# Patient Record
Sex: Female | Born: 1950 | Race: White | Hispanic: No | Marital: Married | Smoking: Former smoker
Health system: Southern US, Community
[De-identification: ages and names within clinical notes are randomized; demographics above are authoritative.]

## PROBLEM LIST (undated history)

## (undated) DIAGNOSIS — E669 Obesity, unspecified: Secondary | ICD-10-CM

## (undated) DIAGNOSIS — D649 Anemia, unspecified: Secondary | ICD-10-CM

## (undated) DIAGNOSIS — E7800 Pure hypercholesterolemia, unspecified: Secondary | ICD-10-CM

## (undated) DIAGNOSIS — Z87891 Personal history of nicotine dependence: Secondary | ICD-10-CM

## (undated) DIAGNOSIS — N1330 Unspecified hydronephrosis: Secondary | ICD-10-CM

## (undated) DIAGNOSIS — A419 Sepsis, unspecified organism: Secondary | ICD-10-CM

## (undated) DIAGNOSIS — K635 Polyp of colon: Secondary | ICD-10-CM

## (undated) DIAGNOSIS — I10 Essential (primary) hypertension: Secondary | ICD-10-CM

## (undated) DIAGNOSIS — N390 Urinary tract infection, site not specified: Secondary | ICD-10-CM

## (undated) DIAGNOSIS — N261 Atrophy of kidney (terminal): Secondary | ICD-10-CM

## (undated) DIAGNOSIS — N135 Crossing vessel and stricture of ureter without hydronephrosis: Secondary | ICD-10-CM

## (undated) HISTORY — DX: Polyp of colon: K63.5

---

## 2004-04-28 HISTORY — PX: CHOLECYSTECTOMY: SHX55

## 2009-04-28 HISTORY — PX: EXCISION / BIOPSY BREAST / NIPPLE / DUCT: SUR469

## 2009-10-25 HISTORY — PX: BREAST SURGERY: SHX581

## 2014-04-11 ENCOUNTER — Encounter: Payer: Self-pay | Admitting: General Surgery

## 2014-04-18 ENCOUNTER — Ambulatory Visit: Payer: Self-pay | Admitting: Family Medicine

## 2014-04-25 ENCOUNTER — Encounter: Payer: Self-pay | Admitting: *Deleted

## 2014-04-25 ENCOUNTER — Ambulatory Visit: Payer: Self-pay | Admitting: General Surgery

## 2014-04-26 ENCOUNTER — Ambulatory Visit: Payer: Self-pay | Admitting: Family Medicine

## 2014-04-26 HISTORY — PX: BREAST BIOPSY: SHX20

## 2014-04-28 HISTORY — PX: COLONOSCOPY: SHX174

## 2014-06-01 ENCOUNTER — Ambulatory Visit: Payer: Self-pay | Admitting: Unknown Physician Specialty

## 2014-06-06 ENCOUNTER — Ambulatory Visit (INDEPENDENT_AMBULATORY_CARE_PROVIDER_SITE_OTHER): Payer: Managed Care, Other (non HMO) | Admitting: General Surgery

## 2014-06-06 ENCOUNTER — Encounter: Payer: Self-pay | Admitting: General Surgery

## 2014-06-06 VITALS — BP 164/88 | HR 84 | Resp 16 | Ht 64.0 in | Wt 235.0 lb

## 2014-06-06 DIAGNOSIS — N62 Hypertrophy of breast: Secondary | ICD-10-CM

## 2014-06-06 DIAGNOSIS — N6091 Unspecified benign mammary dysplasia of right breast: Secondary | ICD-10-CM

## 2014-06-06 DIAGNOSIS — D242 Benign neoplasm of left breast: Secondary | ICD-10-CM

## 2014-06-06 NOTE — Patient Instructions (Addendum)
The patient has been asked to return to the office in 6 months with an office ultrasound. Continue self breast exams. Call office for any new breast issues or concerns. Records from [place] right breast results

## 2014-06-06 NOTE — Progress Notes (Signed)
Patient ID: Victoria Clarke, female   DOB: [birth year], 64 y.o.   MRN: [id number]  Chief Complaint  Patient presents with  . Breast Problem    nipple discharge    HPI Victoria Clarke is a 64 y.o. female here today for evaluation of left nipple discharge . She states that around June she noticed that the left breast had a clear discharge. She states that it has happened a couple of times since that time all while washing dishes.  She moved here from [place] and became established with her physicians and she had her mammogram. They did complete a left breast biopsy done on 04/26/14. Since the biopsy she had not noticed any further discharge. The patient reported significant hematoma and discoloration after the procedure which is now resolving. She states that when the right breast had issues in 2011 there was a milky bloody discharge. No further issues since that surgery.  She does have regular mammograms and do regular breast exams.   HPI  Past Medical History  Diagnosis Date  . Colon polyp     Past Surgical History  Procedure Laterality Date  . Cholecystectomy  2006  . Cesarean section  1981  . Colonoscopy  2016    Dr Vira Agar  . Breast surgery Right 10/25/2009    milk duct excision in [place], atypical duct epithelial proliferation l  . Breast biopsy Left 04-26-14    ductal hyperplasia, 1 mm papilloma.    Family History  Problem Relation Age of Onset  . Cancer Sister 110    breast  . Cancer Maternal Aunt     breast/great Aunt    Social History History  Substance Use Topics  . Smoking status: Former Smoker -- 1.00 packs/day for 30 years    Types: Cigarettes    Quit date: 04/29/1999  . Smokeless tobacco: Never Used  . Alcohol Use: No    No Known Allergies  Current Outpatient Prescriptions  Medication Sig Dispense Refill  . Omega-3 Fatty Acids (FISH OIL PO) Take by mouth daily.    . Vitamin D, Ergocalciferol, (DRISDOL) 50000 UNITS CAPS capsule      No current  facility-administered medications for this visit.    Review of Systems Review of Systems  Constitutional: Negative.   Respiratory: Negative.   Cardiovascular: Negative.     Blood pressure 164/88, pulse 84, resp. rate 16, height 5\' 4"  (1.626 m), weight 235 lb (106.595 kg).  Physical Exam Physical Exam  Constitutional: She is oriented to person, place, and time. She appears well-developed and well-nourished.  Neck: Neck supple.  Cardiovascular: Normal rate, regular rhythm and normal heart sounds.   Pulmonary/Chest: Effort normal and breath sounds normal. Right breast exhibits no inverted nipple, no mass, no nipple discharge, no skin change and no tenderness. Left breast exhibits skin change. Left breast exhibits no inverted nipple, no mass, no nipple discharge and no tenderness.    Left breast > right breast. Well healed circular incision around areolar 9-3 o'clock position right breast. Residual discoloration at 3 o'clock position left breast.  Lymphadenopathy:    She has no cervical adenopathy.    She has no axillary adenopathy.  Neurological: She is alert and oriented to person, place, and time.  Skin: Skin is warm and dry.    Data: Assessment   Mammograms dated 04/18/2014 showed a 10 mm density in the upper-outer quadrant of the left breast. Subsequent ultrasound of the same date showed a simple cyst in this area. Incidentally noted was an 8  x 8 x 11 mm cystic and solid mass in the left retroareolar location at 6:00. No adenopathy. BI-RADS-4.  The patient subsequently underwent ultrasound-guided biopsy on 04/26/2014. A 12-gauge vacuum-assisted device was utilized. Choroidal marker deployed and post procedure mammogram completed.   Subsequent pathology showed a cluster of apical and microcysts and a 1 mm intraductal papilloma without atypia. No malignancy.  Impression  Ultrasound abnormality secondary to apical and cyst with incidental finding of a 1 mm papilloma. Questionable  right breast pathology with GYN notation raising a question of DCIS. Records requested.    Plan    I would not recommend excision for a 1 mm papilloma without atypia. This can be completed if the patient is anxious. I recommendation would be to reassess the area by ultrasound in 6 months. Considering that a vacuum assisted biopsy was completed  I have little suspicion of any residual papilloma.    The patient has been asked to return to the office in 6 months with an office ultrasound. Continue self breast exams. Call office for any new breast issues or concerns.  PCP:  Dionisio David M Ref: Fredrich Romans NP  Robert Bellow 06/07/2014, 5:19 PM

## 2014-06-07 ENCOUNTER — Encounter: Payer: Self-pay | Admitting: General Surgery

## 2014-06-07 DIAGNOSIS — N6091 Unspecified benign mammary dysplasia of right breast: Secondary | ICD-10-CM | POA: Insufficient documentation

## 2014-06-07 DIAGNOSIS — D249 Benign neoplasm of unspecified breast: Secondary | ICD-10-CM | POA: Insufficient documentation

## 2014-08-21 LAB — SURGICAL PATHOLOGY

## 2014-08-23 ENCOUNTER — Encounter: Payer: Self-pay | Admitting: General Surgery

## 2014-08-23 NOTE — Progress Notes (Signed)
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 This was confirmed on second opinion from [facility].  Office notes dated 10/23/2009 reported an MRI completed prior to excision had been negative.  EOMI oral risk calculation based on her family history with a sister with cancer, menarche age 65, first birth at 11 and a report of atypia on the 2011 biopsy shows a five-year risk for breast cancer at 7.9%, lifetime risk 28.1.  We will review with the patient at follow-up pros and cons of chemoprevention, and confirm the age of onset of her sisters malignancy.

## 2014-10-04 ENCOUNTER — Ambulatory Visit: Payer: Self-pay | Admitting: General Surgery

## 2014-11-06 ENCOUNTER — Ambulatory Visit: Payer: Managed Care, Other (non HMO)

## 2014-11-06 ENCOUNTER — Ambulatory Visit (INDEPENDENT_AMBULATORY_CARE_PROVIDER_SITE_OTHER): Payer: Medicare Other | Admitting: General Surgery

## 2014-11-06 ENCOUNTER — Encounter: Payer: Self-pay | Admitting: General Surgery

## 2014-11-06 VITALS — BP 134/78 | HR 76 | Resp 14 | Ht 64.0 in | Wt 232.0 lb

## 2014-11-06 DIAGNOSIS — D242 Benign neoplasm of left breast: Secondary | ICD-10-CM | POA: Diagnosis not present

## 2014-11-06 DIAGNOSIS — N6091 Unspecified benign mammary dysplasia of right breast: Secondary | ICD-10-CM

## 2014-11-06 DIAGNOSIS — N62 Hypertrophy of breast: Secondary | ICD-10-CM | POA: Diagnosis not present

## 2014-11-06 MED ORDER — TAMOXIFEN CITRATE 20 MG PO TABS: 20.0000 mg | ORAL_TABLET | Freq: Every day | ORAL | Status: DC

## 2014-11-06 NOTE — Patient Instructions (Addendum)
Patient to take aspirin 81 daily and call us in one month to let the office no how you are doing with tamoxifen.. Patient will be asked to return to the office in six months with a bilateral diagnotic mammogram. Continue self breast exams. Call office for any new breast issues or concerns.

## 2014-11-06 NOTE — Progress Notes (Signed)
Patient ID: Victoria Clarke, female   DOB: 02/27/1951, 64 y.o.   MRN: [id number]  Chief Complaint  Patient presents with  . Other    left breast ultrasound    HPI Victoria Clarke is a 64 y.o. female here today for a left breast ultrasound. Patient states she does not feel any lumps in her left breast and no nipple drainage.. Last mammogram was on 04/18/14.  HPI  Past Medical History  Diagnosis Date  . Colon polyp     Past Surgical History  Procedure Laterality Date  . Cholecystectomy  2006  . Cesarean section  1981  . Colonoscopy  2016    Dr Vira Agar  . Breast surgery Right 10/25/2009    milk duct excision in [place], atypical duct epithelial proliferation l  . Breast biopsy Left 04-26-14    ductal hyperplasia, 1 mm papilloma.    Family History  Problem Relation Age of Onset  . Cancer Sister 82    breast  . Cancer Maternal Aunt     breast/great Aunt    Social History History  Substance Use Topics  . Smoking status: Former Smoker -- 1.00 packs/day for 30 years    Types: Cigarettes    Quit date: 04/29/1999  . Smokeless tobacco: Never Used  . Alcohol Use: No    No Known Allergies  Current Outpatient Prescriptions  Medication Sig Dispense Refill  . Omega-3 Fatty Acids (FISH OIL PO) Take by mouth daily.    . Vitamin D, Ergocalciferol, (DRISDOL) 50000 UNITS CAPS capsule Take 5,000 Units by mouth every 7 (seven) days.     . tamoxifen (NOLVADEX) 20 MG tablet Take 1 tablet (20 mg total) by mouth daily. 30 tablet 12   No current facility-administered medications for this visit.    Review of Systems Review of Systems  Constitutional: Negative.   Respiratory: Negative.   Cardiovascular: Negative.     Blood pressure 134/78, pulse 76, resp. rate 14, height 5\' 4"  (1.626 m), weight 232 lb (105.235 kg).  Physical Exam Physical Exam  Constitutional: She is oriented to person, place, and time. She appears well-developed and well-nourished.  HENT:  Mouth/Throat:  Oropharynx is clear and moist.  Eyes: Conjunctivae are normal. No scleral icterus.  Neck: Neck supple.  Cardiovascular: Normal rate, regular rhythm and normal heart sounds.   Pulmonary/Chest: Effort normal and breath sounds normal. Right breast exhibits no inverted nipple, no mass, no nipple discharge, no skin change and no tenderness. Left breast exhibits no inverted nipple, no mass, no nipple discharge, no skin change and no tenderness.  Lymphadenopathy:    She has no cervical adenopathy.  Neurological: She is alert and oriented to person, place, and time.  Skin: Skin is warm and dry.    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 This was confirmed on second opinion from [facility].  Office notes dated 10/23/2009 reported an MRI completed prior to excision had been negative.  Victoria Clarke moder risk calculation based on her family history with a sister with cancer, menarche age 80, first birth at 81 and a report of atypia on the 2011 biopsy shows a five-year risk for breast cancer at 7.9%, lifetime risk 28.1.  Ultrasound examination of the left breast in the retroareolar area was completed based on the previous findings of a papilloma. Multiple areas of ductal dilatation involving the retroareolar and immediately adjacent tissue from the 12-3:00 position is noted. Maximum diameter  0.4 cm. There is a area consistent with the previous biopsy site in the immediate retroareolar area measuring 0.6 x 1.0 cm with evidence of residual clip material.   Assessment    Previous left breast 1 mm papilloma.  ADH involving the right breast.    Plan    the patient had not been offered the opportunity for chemoprevention therapy in 2011 after her original right breast biopsy. We reviewed the pros and cons of chemoprevention. Age risks including 1) 1% risk of DVT/PE; 2) 1% risk of  uterine cancer; 3) vasomotor symptoms.  The patient expressed an interest in making use of tamoxifen. She'll make use of it for 1 month and give a phone follow-up at that time she reported her tolerance. The importance of a one-month trial as most of the symptoms related to vasomotor dysfunction occurred during weeks 2-3 was emphasized.     Discussed tamoxifen with patient, Rx given. Patient to take baby aspirin daily.   Patient will be asked to return to the office in six months with a bilateral diagnotic mammogram. Continue self breast exams. Call office for any new breast issues or concerns.    PCP: Dionisio David M Ref: Fredrich Romans NP  Robert Bellow 11/06/2014, 9:25 AM

## 2015-03-15 ENCOUNTER — Other Ambulatory Visit: Payer: Self-pay | Admitting: *Deleted

## 2015-03-15 DIAGNOSIS — D242 Benign neoplasm of left breast: Secondary | ICD-10-CM

## 2015-03-28 ENCOUNTER — Encounter: Payer: Self-pay | Admitting: *Deleted

## 2015-05-01 ENCOUNTER — Ambulatory Visit
Admission: RE | Admit: 2015-05-01 | Discharge: 2015-05-01 | Disposition: A | Discharge status: Home / Self Care | Payer: Medicare Other | Source: Ambulatory Visit | Attending: General Surgery | Admitting: General Surgery

## 2015-05-01 DIAGNOSIS — D242 Benign neoplasm of left breast: Secondary | ICD-10-CM

## 2015-05-07 ENCOUNTER — Ambulatory Visit: Payer: Medicare Other | Admitting: General Surgery

## 2015-05-17 ENCOUNTER — Ambulatory Visit: Payer: Medicare Other | Admitting: General Surgery

## 2015-05-21 ENCOUNTER — Encounter: Payer: Self-pay | Admitting: General Surgery

## 2015-05-21 ENCOUNTER — Ambulatory Visit (INDEPENDENT_AMBULATORY_CARE_PROVIDER_SITE_OTHER): Payer: Medicare Other | Admitting: General Surgery

## 2015-05-21 VITALS — BP 170/98 | HR 84 | Resp 14 | Ht 64.0 in | Wt 242.0 lb

## 2015-05-21 DIAGNOSIS — N62 Hypertrophy of breast: Secondary | ICD-10-CM

## 2015-05-21 DIAGNOSIS — T451X5A Adverse effect of antineoplastic and immunosuppressive drugs, initial encounter: Secondary | ICD-10-CM | POA: Insufficient documentation

## 2015-05-21 DIAGNOSIS — R232 Flushing: Secondary | ICD-10-CM | POA: Diagnosis not present

## 2015-05-21 DIAGNOSIS — D242 Benign neoplasm of left breast: Secondary | ICD-10-CM

## 2015-05-21 DIAGNOSIS — N6091 Unspecified benign mammary dysplasia of right breast: Secondary | ICD-10-CM

## 2015-05-21 MED ORDER — VENLAFAXINE HCL ER 75 MG PO CP24: 75.0000 mg | ORAL_CAPSULE | Freq: Every day | ORAL | Status: DC

## 2015-05-21 NOTE — Progress Notes (Signed)
Patient ID: Victoria Clarke, female   DOB: 03-25-1951, 65 y.o.   MRN: [id number]  Chief Complaint  Patient presents with  . Follow-up    mammogram    HPI Victoria Clarke is a 65 y.o. female who presents for a breast evaluation. The most recent mammogram was done on 05/01/15.  Patient does perform regular self breast checks and gets regular mammograms done.  Patient states she is having a lot of hot flashes with the Tamoxifen I personally reviewed the patient's history. HPI  Past Medical History  Diagnosis Date  . Colon polyp     Past Surgical History  Procedure Laterality Date  . Cholecystectomy  2006  . Cesarean section  1981  . Colonoscopy  2016    Dr Vira Agar  . Breast surgery Right 10/25/2009    milk duct excision in [place], atypical duct epithelial proliferation l  . Breast biopsy Left 04-26-14    ductal hyperplasia, 1 mm papilloma.  . Excision / biopsy breast / nipple / duct Right 2011    duct excision    Family History  Problem Relation Age of Onset  . Cancer Sister 76    breast  . Cancer Maternal Aunt     breast/great Aunt    Social History Social History  Substance Use Topics  . Smoking status: Former Smoker -- 1.00 packs/day for 30 years    Types: Cigarettes    Quit date: 04/29/1999  . Smokeless tobacco: Never Used  . Alcohol Use: No    No Known Allergies  Current Outpatient Prescriptions  Medication Sig Dispense Refill  . aspirin 81 MG tablet Take 81 mg by mouth daily.    . Omega-3 Fatty Acids (FISH OIL PO) Take by mouth daily.    . tamoxifen (NOLVADEX) 20 MG tablet Take 1 tablet (20 mg total) by mouth daily. 30 tablet 12  . Vitamin D, Ergocalciferol, (DRISDOL) 50000 UNITS CAPS capsule Take 5,000 Units by mouth every 7 (seven) days.     Marland Kitchen venlafaxine XR (EFFEXOR XR) 75 MG 24 hr capsule Take 1 capsule (75 mg total) by mouth daily with breakfast. 30 capsule 11   No current facility-administered medications for this visit.    Review of Systems Review  of Systems  Constitutional: Negative.   Respiratory: Negative.   Cardiovascular: Negative.     Blood pressure 170/98, pulse 84, resp. rate 14, height 5\' 4"  (1.626 m), weight 242 lb (109.77 kg).  Physical Exam Physical Exam  Constitutional: She is oriented to person, place, and time. She appears well-developed and well-nourished.  Eyes: Conjunctivae are normal. No scleral icterus.  Neck: Neck supple.  Cardiovascular: Normal rate, regular rhythm and normal heart sounds.   Pulmonary/Chest: Effort normal and breath sounds normal. Right breast exhibits no inverted nipple, no mass, no nipple discharge, no skin change and no tenderness. Left breast exhibits no inverted nipple, no mass, no nipple discharge, no skin change and no tenderness.  Lymphadenopathy:    She has no cervical adenopathy.    She has no axillary adenopathy.  Neurological: She is alert and oriented to person, place, and time.  Skin: Skin is warm and dry.    Data Reviewed Bilateral mammograms dated 05/01/2015 were independently reviewed. I read-2. No interval change.  Assessment    Stable breast exam.  Significant and persistent vasomotor symptoms on chemoprevention with tamoxifen.    Plan    The opportunity to make use of a nonhormonal treatment for her vasomotor symptoms was discussed. She is amenable  to a trial of Effexor. She has been asked to call in 2 weeks with a progress report.    The patient has been asked to return to the office in one year with a bilateral diagnostic mammogram. PCP:  No Pcp   This information has been scribed by Gaspar Cola CMA.    Robert Bellow 05/22/2015, 5:48 PM

## 2015-05-21 NOTE — Patient Instructions (Signed)
Patient to return  

## 2016-01-15 ENCOUNTER — Other Ambulatory Visit: Payer: Self-pay | Admitting: *Deleted

## 2016-01-15 ENCOUNTER — Other Ambulatory Visit: Payer: Self-pay | Admitting: General Surgery

## 2016-01-15 DIAGNOSIS — N6091 Unspecified benign mammary dysplasia of right breast: Secondary | ICD-10-CM

## 2016-01-15 NOTE — Progress Notes (Signed)
Pt states she is not taking Effexor but that she is taking Tamoxifen.

## 2016-03-17 ENCOUNTER — Other Ambulatory Visit: Payer: Self-pay

## 2016-03-17 DIAGNOSIS — D242 Benign neoplasm of left breast: Secondary | ICD-10-CM

## 2016-05-05 ENCOUNTER — Ambulatory Visit
Admission: RE | Admit: 2016-05-05 | Discharge: 2016-05-05 | Disposition: A | Discharge status: Home / Self Care | Payer: Medicare Other | Source: Ambulatory Visit | Attending: General Surgery | Admitting: General Surgery

## 2016-05-05 DIAGNOSIS — D242 Benign neoplasm of left breast: Secondary | ICD-10-CM

## 2016-05-13 ENCOUNTER — Ambulatory Visit (INDEPENDENT_AMBULATORY_CARE_PROVIDER_SITE_OTHER): Payer: Medicare Other | Admitting: General Surgery

## 2016-05-13 ENCOUNTER — Encounter: Payer: Self-pay | Admitting: General Surgery

## 2016-05-13 VITALS — BP 154/90 | HR 86 | Resp 16 | Ht 63.5 in | Wt 244.0 lb

## 2016-05-13 DIAGNOSIS — N6091 Unspecified benign mammary dysplasia of right breast: Secondary | ICD-10-CM

## 2016-05-13 NOTE — Patient Instructions (Addendum)
Patient will be asked to return to the office in one year with a bilateral diagnostic mammogram. The patient is aware to call back for any questions or concerns.

## 2016-05-13 NOTE — Progress Notes (Signed)
Patient ID: Victoria Clarke, female   DOB: Mar 18, 1951, 66 y.o.   MRN: [id number]  Chief Complaint  Patient presents with  . Follow-up    HPI Victoria Clarke is a 66 y.o. female.  who presents for ductal hyperplasia follow up and a breast evaluation. The most recent mammogram was done on 05-05-16.  Patient does perform regular self breast checks and gets regular mammograms done.   Tolerating the tamoxifen, only notices night sweats. Recovering from "flu" symptoms.   HPI  Past Medical History:  Diagnosis Date  . Colon polyp     Past Surgical History:  Procedure Laterality Date  . BREAST BIOPSY Left 04-26-14   ductal hyperplasia, 1 mm papilloma.  Marland Kitchen BREAST SURGERY Right 10/25/2009   milk duct excision in [place], atypical duct epithelial proliferation l  . CESAREAN SECTION  1981  . CHOLECYSTECTOMY  2006  . COLONOSCOPY  2016   Dr Vira Agar  . EXCISION / BIOPSY BREAST / NIPPLE / DUCT Right 2011   duct excision    Family History  Problem Relation Age of Onset  . Cancer Sister 4    breast  . Cancer Maternal Aunt     breast/great Aunt    Social History Social History  Substance Use Topics  . Smoking status: Former Smoker    Packs/day: 1.00    Years: 30.00    Types: Cigarettes    Quit date: 04/29/1999  . Smokeless tobacco: Never Used  . Alcohol use No    No Known Allergies  Current Outpatient Prescriptions  Medication Sig Dispense Refill  . aspirin 81 MG tablet Take 81 mg by mouth daily.    . Omega-3 Fatty Acids (FISH OIL PO) Take by mouth daily.    . tamoxifen (NOLVADEX) 20 MG tablet TAKE 1 TABLET DAILY 90 tablet 4  . Vitamin D, Ergocalciferol, (DRISDOL) 50000 UNITS CAPS capsule Take 5,000 Units by mouth daily.      No current facility-administered medications for this visit.     Review of Systems Review of Systems  Constitutional: Negative.   Respiratory: Negative.   Cardiovascular: Negative.     Blood pressure (!) 154/90, pulse 86, resp. rate 16, height 5'  3.5" (1.613 m), weight 244 lb (110.7 kg).  Physical Exam Physical Exam  Constitutional: She is oriented to person, place, and time. She appears well-developed and well-nourished.  HENT:  Mouth/Throat: Oropharynx is clear and moist.  Eyes: Conjunctivae are normal. No scleral icterus.  Neck: Neck supple.  Cardiovascular: Normal rate, regular rhythm and normal heart sounds.   Pulmonary/Chest: Effort normal and breath sounds normal. Right breast exhibits no inverted nipple, no mass, no nipple discharge, no skin change and no tenderness. Left breast exhibits no inverted nipple, no mass, no nipple discharge, no skin change and no tenderness.  Mild rash lower left breast.  Lymphadenopathy:    She has no cervical adenopathy.    She has no axillary adenopathy.  Neurological: She is alert and oriented to person, place, and time.  Skin: Skin is warm and dry.  Psychiatric: Her behavior is normal.    Data Reviewed Bilateral mammograms dated 05/05/2016 reviewed. No interval change. BI-RADS-2.    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  History small intraductal papilloma left breast status post vacuum biopsy 2015     Plan     The patient has completed 18 months of tamoxifen therapy and is tolerating this reasonably well. She  is still having some vasomotor symptoms were not so severe as to considered medication discontinuation.    The patient has been asked to return to the office in one year with a bilateral diagnostic mammogram  This information has been scribed by Karie Fetch RN, BSN,BC.   Robert Bellow 05/14/2016, 1:58 PM

## 2016-09-09 ENCOUNTER — Encounter: Payer: Self-pay | Admitting: General Surgery

## 2017-02-27 ENCOUNTER — Other Ambulatory Visit: Payer: Self-pay | Admitting: General Surgery

## 2017-02-27 DIAGNOSIS — N6091 Unspecified benign mammary dysplasia of right breast: Secondary | ICD-10-CM

## 2017-03-12 ENCOUNTER — Other Ambulatory Visit: Payer: Self-pay

## 2017-03-12 DIAGNOSIS — D242 Benign neoplasm of left breast: Secondary | ICD-10-CM

## 2017-03-12 DIAGNOSIS — N6091 Unspecified benign mammary dysplasia of right breast: Secondary | ICD-10-CM

## 2017-05-12 ENCOUNTER — Ambulatory Visit
Admission: RE | Admit: 2017-05-12 | Discharge: 2017-05-12 | Disposition: A | Discharge status: Home / Self Care | Payer: Medicare Other | Source: Ambulatory Visit | Attending: General Surgery | Admitting: General Surgery

## 2017-05-12 DIAGNOSIS — N6091 Unspecified benign mammary dysplasia of right breast: Secondary | ICD-10-CM | POA: Diagnosis present

## 2017-05-12 DIAGNOSIS — D242 Benign neoplasm of left breast: Secondary | ICD-10-CM

## 2017-05-19 ENCOUNTER — Encounter: Payer: Self-pay | Admitting: General Surgery

## 2017-05-19 ENCOUNTER — Ambulatory Visit (INDEPENDENT_AMBULATORY_CARE_PROVIDER_SITE_OTHER): Payer: Medicare Other | Admitting: General Surgery

## 2017-05-19 VITALS — BP 162/86 | HR 100 | Ht 63.0 in | Wt 226.0 lb

## 2017-05-19 DIAGNOSIS — N6091 Unspecified benign mammary dysplasia of right breast: Secondary | ICD-10-CM

## 2017-05-19 NOTE — Progress Notes (Signed)
Patient ID: Victoria Clarke, female   DOB: 1950-06-12, 67 y.o.   MRN: [id number]  Chief Complaint  Patient presents with  . Follow-up    HPI Victoria Clarke is a 67 y.o. female.  who presents for a breast evaluation. The most recent mammogram was done on 05-12-17.  Patient does perform regular self breast checks and gets regular mammograms done.   She states her blood pressure at home was 142/ 83. No new breast issues. Tolerating the tamoxifen, only notices night sweats. Denies leg cramps. Bilateral diagnostic mammograms dated May 12, 2017 were reviewed.   HPI  Past Medical History:  Diagnosis Date  . Colon polyp     Past Surgical History:  Procedure Laterality Date  . BREAST BIOPSY Left 04-26-14   ductal hyperplasia, 1 mm papilloma.  Marland Kitchen BREAST SURGERY Right 10/25/2009   milk duct excision in [place], atypical duct epithelial proliferation l  . CESAREAN SECTION  1981  . CHOLECYSTECTOMY  2006  . COLONOSCOPY  2016   Dr Vira Agar  . EXCISION / BIOPSY BREAST / NIPPLE / DUCT Right 2011   duct excision    Family History  Problem Relation Age of Onset  . Breast cancer Mother 11       Low grade DCIS  . Cancer Sister 104       breast  . Cancer Maternal Aunt        breast/great Aunt    Social History Social History   Tobacco Use  . Smoking status: Former Smoker    Packs/day: 1.00    Years: 30.00    Pack years: 30.00    Types: Cigarettes    Last attempt to quit: 04/29/1999    Years since quitting: 18.0  . Smokeless tobacco: Never Used  Substance Use Topics  . Alcohol use: No    Alcohol/week: 0.0 oz  . Drug use: No    No Known Allergies  Current Outpatient Medications  Medication Sig Dispense Refill  . aspirin 81 MG tablet Take 81 mg by mouth daily.    . Cholecalciferol (VITAMIN D3 PO) Take 500 mg by mouth daily.    . Omega-3 Fatty Acids (FISH OIL PO) Take by mouth daily.    . tamoxifen (NOLVADEX) 20 MG tablet TAKE 1 TABLET DAILY 90 tablet 4   No current  facility-administered medications for this visit.     Review of Systems Review of Systems  Constitutional: Negative.   Respiratory: Negative.   Cardiovascular: Negative.     Blood pressure (!) 162/86, pulse 100, height 5\' 3"  (1.6 m), weight 226 lb (102.5 kg).  Physical Exam Physical Exam  Constitutional: She is oriented to person, place, and time. She appears well-developed and well-nourished.  HENT:  Mouth/Throat: Oropharynx is clear and moist.  Eyes: Conjunctivae are normal. No scleral icterus.  Neck: Neck supple.  Cardiovascular: Normal rate, regular rhythm and normal heart sounds.  Pulmonary/Chest: Effort normal and breath sounds normal. Right breast exhibits no inverted nipple, no mass, no nipple discharge, no skin change and no tenderness. Left breast exhibits no inverted nipple, no mass, no nipple discharge, no skin change and no tenderness.  Lymphadenopathy:    She has no cervical adenopathy.    She has no axillary adenopathy.  Neurological: She is alert and oriented to person, place, and time.  Skin: Skin is warm and dry.  Psychiatric: Her behavior is normal.    Data Reviewed Bilateral mammograms dated May 12, 2017 were reviewed.  No interval change.  BI-RADS-2.  Recommendation to change to consider change to screening mammography noted.  Assessment    Stable breast exam.  Continued good tolerance of tamoxifen.    Plan    The patient will continue on tamoxifen as she is tolerating it well.    She will bring her home blood pressure device in for comparison in the future, pt agrees. No PCP. The patient has been asked to return to the office in one year with a bilateral screening mammogram.    HPI, Physical Exam, Assessment and Plan have been scribed under the direction and in the presence of Robert Bellow, MD. Karie Fetch, RN  I have completed the exam and reviewed the above documentation for accuracy and completeness.  I agree with the above.   Haematologist has been used and any errors in dictation or transcription are unintentional.  Hervey Ard, M.D., F.A.C.S.   Forest Gleason Taeja Debellis 05/21/2017, 6:06 AM

## 2017-05-19 NOTE — Patient Instructions (Addendum)
The patient is aware to call back for any questions or concerns.  She will bring her home blood pressure device in for comparison in the future, pt agrees. The patient has been asked to return to the office in one year with a bilateral screening mammogram

## 2018-03-15 ENCOUNTER — Other Ambulatory Visit: Payer: Self-pay

## 2018-03-15 DIAGNOSIS — Z1231 Encounter for screening mammogram for malignant neoplasm of breast: Secondary | ICD-10-CM

## 2018-05-13 ENCOUNTER — Ambulatory Visit
Admission: RE | Admit: 2018-05-13 | Discharge: 2018-05-13 | Disposition: A | Discharge status: Home / Self Care | Payer: Medicare Other | Source: Ambulatory Visit | Attending: General Surgery | Admitting: General Surgery

## 2018-05-13 DIAGNOSIS — Z1231 Encounter for screening mammogram for malignant neoplasm of breast: Secondary | ICD-10-CM | POA: Insufficient documentation

## 2018-05-25 ENCOUNTER — Ambulatory Visit: Payer: Medicare Other | Admitting: General Surgery

## 2018-05-26 ENCOUNTER — Ambulatory Visit: Payer: Medicare Other | Admitting: Surgery

## 2018-10-28 ENCOUNTER — Other Ambulatory Visit: Payer: Self-pay | Admitting: General Surgery

## 2018-10-28 DIAGNOSIS — N6091 Unspecified benign mammary dysplasia of right breast: Secondary | ICD-10-CM

## 2018-11-02 ENCOUNTER — Other Ambulatory Visit: Payer: Self-pay

## 2018-11-02 ENCOUNTER — Encounter: Payer: Self-pay | Admitting: General Surgery

## 2018-11-02 ENCOUNTER — Ambulatory Visit (INDEPENDENT_AMBULATORY_CARE_PROVIDER_SITE_OTHER): Payer: Medicare Other | Admitting: General Surgery

## 2018-11-02 VITALS — BP 166/100 | HR 91 | Temp 97.6°F | Ht 64.0 in | Wt 213.0 lb

## 2018-11-02 DIAGNOSIS — N6091 Unspecified benign mammary dysplasia of right breast: Secondary | ICD-10-CM

## 2018-11-02 DIAGNOSIS — Z1231 Encounter for screening mammogram for malignant neoplasm of breast: Secondary | ICD-10-CM

## 2018-11-02 NOTE — Patient Instructions (Addendum)
Patient will be asked to return to the office in 6 months  with a bilateral screening mammogram. The patient is aware to call back for any questions or concerns.

## 2018-11-02 NOTE — Progress Notes (Signed)
Patient ID: Victoria Clarke, female   DOB: 1950-12-15, 68 y.o.   MRN: [id number]  Chief Complaint  Patient presents with  . Follow-up    HPI Victoria Clarke is a 68 y.o. female who presents for a breast evaluation. The most recent mammogram was done on 05/03/2018.  Patient does perform regular self breast checks and gets regular mammograms done.    The patient reports after last year's exam she did get a home blood pressure monitor.  Systolics normally run 939-030, diastolics in the 09-23 range.  She has had this calibrated once in a physician's office.  She reports profound "white coat "" hypertension.  HPI  Past Medical History:  Diagnosis Date  . Colon polyp     Past Surgical History:  Procedure Laterality Date  . BREAST BIOPSY Left 04-26-14   ductal hyperplasia, 1 mm papilloma.  Marland Kitchen BREAST SURGERY Right 10/25/2009   milk duct excision in [place], atypical duct epithelial proliferation l  . CESAREAN SECTION  1981  . CHOLECYSTECTOMY  2006  . COLONOSCOPY  2016   Dr Vira Agar  . EXCISION / BIOPSY BREAST / NIPPLE / DUCT Right 2011   duct excision    Family History  Problem Relation Age of Onset  . Breast cancer Mother 52       Low grade DCIS  . Cancer Sister 69       breast  . Cancer Maternal Aunt        breast/great Aunt    Social History Social History   Tobacco Use  . Smoking status: Former Smoker    Packs/day: 1.00    Years: 30.00    Pack years: 30.00    Types: Cigarettes    Quit date: 04/29/1999    Years since quitting: 19.5  . Smokeless tobacco: Never Used  Substance Use Topics  . Alcohol use: No    Alcohol/week: 0.0 standard drinks  . Drug use: No    No Known Allergies  Current Outpatient Medications  Medication Sig Dispense Refill  . aspirin 81 MG tablet Take 81 mg by mouth daily.    . Cholecalciferol (VITAMIN D3 PO) Take 500 mg by mouth daily.    . Omega-3 Fatty Acids (FISH OIL PO) Take by mouth daily.    . tamoxifen (NOLVADEX) 20 MG tablet TAKE 1  TABLET DAILY 90 tablet 3   No current facility-administered medications for this visit.     Review of Systems Review of Systems  Blood pressure (!) 166/100, pulse 91, temperature 97.6 F (36.4 C), temperature source Skin, height 5\' 4"  (1.626 m), weight 213 lb (96.6 kg), SpO2 98 %.  Physical Exam Physical Exam Constitutional:      Appearance: She is well-developed.  Eyes:     General: No scleral icterus.    Conjunctiva/sclera: Conjunctivae normal.  Neck:     Musculoskeletal: Neck supple.  Cardiovascular:     Rate and Rhythm: Normal rate and regular rhythm.     Heart sounds: Normal heart sounds.  Pulmonary:     Effort: Pulmonary effort is normal.     Breath sounds: Normal breath sounds.  Chest:     Breasts:        Right: Normal.        Left: Normal.  Lymphadenopathy:     Cervical: No cervical adenopathy.  Skin:    General: Skin is warm and dry.  Neurological:     Mental Status: She is alert and oriented to person, place, and time.  Data Reviewed May 13, 2018 bilateral screening mammograms were independently reviewed.  BI-RADS-1.  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  Normal mammograms and clinical exam.  Plan  Patient will be asked to return to the office in 6 months with a bilateral screening mammogram. The patient is aware to call back for any questions or concerns.  The patient is due for a follow-up colonoscopy in January 2021 with Dr. Vira Agar.  She has been once again encouraged to get a PCP and to record her blood pressures daily so that her blood pressure machine can be calibrated again and a physician who assumed her care would know that her blood pressure is indeed in reasonable control at all times.  HPI, Physical Exam, Assessment and Plan have been scribed under the direction and in the presence of Hervey Ard, MD.  Victoria Clarke, CMA  Victoria Clarke 11/03/2018, 9:36 PM

## 2018-11-22 ENCOUNTER — Encounter: Payer: Self-pay | Admitting: General Surgery

## 2019-04-04 ENCOUNTER — Other Ambulatory Visit: Payer: Self-pay

## 2019-04-04 DIAGNOSIS — Z1231 Encounter for screening mammogram for malignant neoplasm of breast: Secondary | ICD-10-CM

## 2019-05-17 ENCOUNTER — Ambulatory Visit
Admission: RE | Admit: 2019-05-17 | Discharge: 2019-05-17 | Disposition: A | Discharge status: Home / Self Care | Payer: Medicare Other | Source: Ambulatory Visit | Attending: Surgery | Admitting: Surgery

## 2019-05-17 DIAGNOSIS — Z1231 Encounter for screening mammogram for malignant neoplasm of breast: Secondary | ICD-10-CM | POA: Insufficient documentation

## 2019-05-25 ENCOUNTER — Ambulatory Visit: Payer: Medicare Other | Admitting: Surgery

## 2019-06-18 ENCOUNTER — Ambulatory Visit: Payer: Medicare Other | Attending: Internal Medicine

## 2019-06-18 DIAGNOSIS — Z23 Encounter for immunization: Secondary | ICD-10-CM | POA: Insufficient documentation

## 2019-06-18 NOTE — Progress Notes (Signed)
   Covid-19 Vaccination Clinic  Name:  Victoria Clarke    MRN: [id number] DOB: Jun 15, 1950  06/18/2019  Victoria Clarke was observed post Covid-19 immunization for 15 minutes without incidence. She was provided with Vaccine Information Sheet and instruction to access the V-Safe system.   Victoria Clarke was instructed to call 911 with any severe reactions post vaccine: Marland Kitchen Difficulty breathing  . Swelling of your face and throat  . A fast heartbeat  . A bad rash all over your body  . Dizziness and weakness    Immunizations Administered    Name Date Dose VIS Date Route   Pfizer COVID-19 Vaccine 06/18/2019  9:20 AM 0.3 mL 04/08/2019 Intramuscular   Manufacturer: [place]   Lot: [id number]   [place]: [id number]

## 2019-07-12 ENCOUNTER — Ambulatory Visit: Payer: Medicare Other | Attending: Internal Medicine

## 2019-07-12 DIAGNOSIS — Z23 Encounter for immunization: Secondary | ICD-10-CM

## 2019-07-12 NOTE — Progress Notes (Signed)
   Covid-19 Vaccination Clinic  Name:  Victoria Clarke    MRN: [id number] DOB: 06/24/1950  07/12/2019  [name] was observed post Covid-19 immunization for 15 minutes without incident. She was provided with Vaccine Information Sheet and instruction to access the V-Safe system.   [name] was instructed to call 911 with any severe reactions post vaccine: Marland Kitchen Difficulty breathing  . Swelling of face and throat  . A fast heartbeat  . A bad rash all over body  . Dizziness and weakness   Immunizations Administered    Name Date Dose VIS Date Route   Pfizer COVID-19 Vaccine 07/12/2019  8:16 AM 0.3 mL 04/08/2019 Intramuscular   Manufacturer: [place]   Lot: [id number]   [place]: [id number]

## 2019-07-25 ENCOUNTER — Encounter: Payer: Self-pay | Admitting: *Deleted

## 2021-08-21 IMAGING — MG DIGITAL SCREENING BILAT W/ TOMO W/ CAD
8 series · 8 of 24 positions shown · non-contrast
Comparison: Previous exam(s).

CLINICAL DATA: Screening.

EXAM:
DIGITAL SCREENING BILATERAL MAMMOGRAM WITH TOMO AND CAD

[R CC synth-2D]
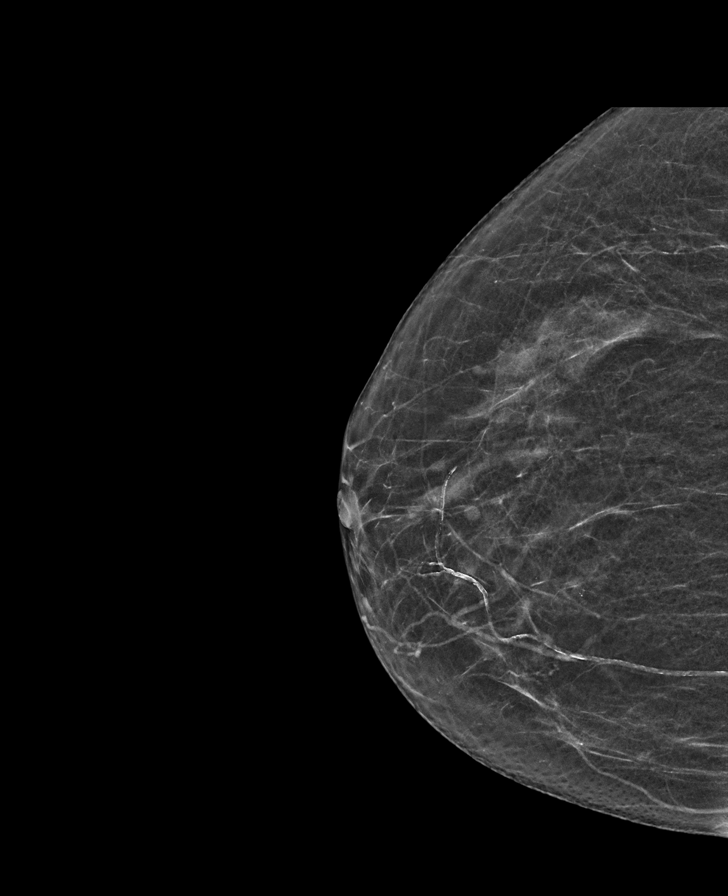

[R MLO synth-2D]
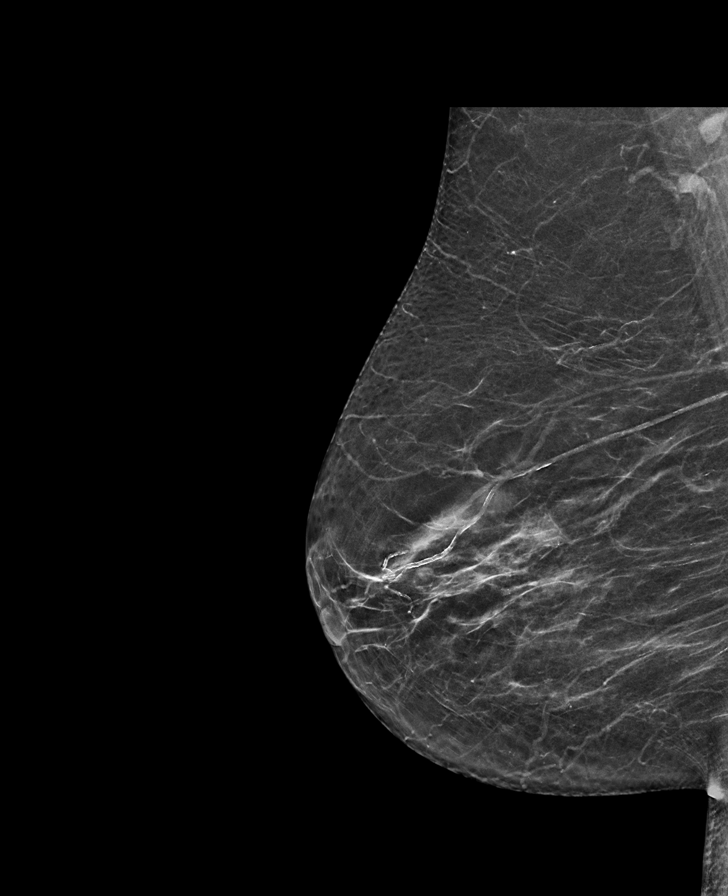

[L MLO synth-2D]
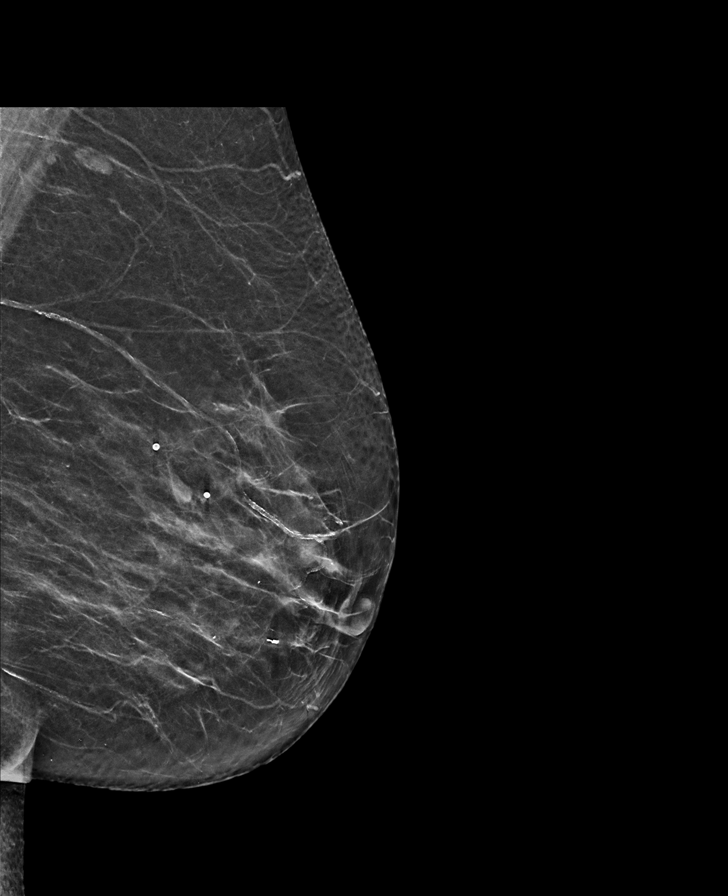

[L CC synth-2D]
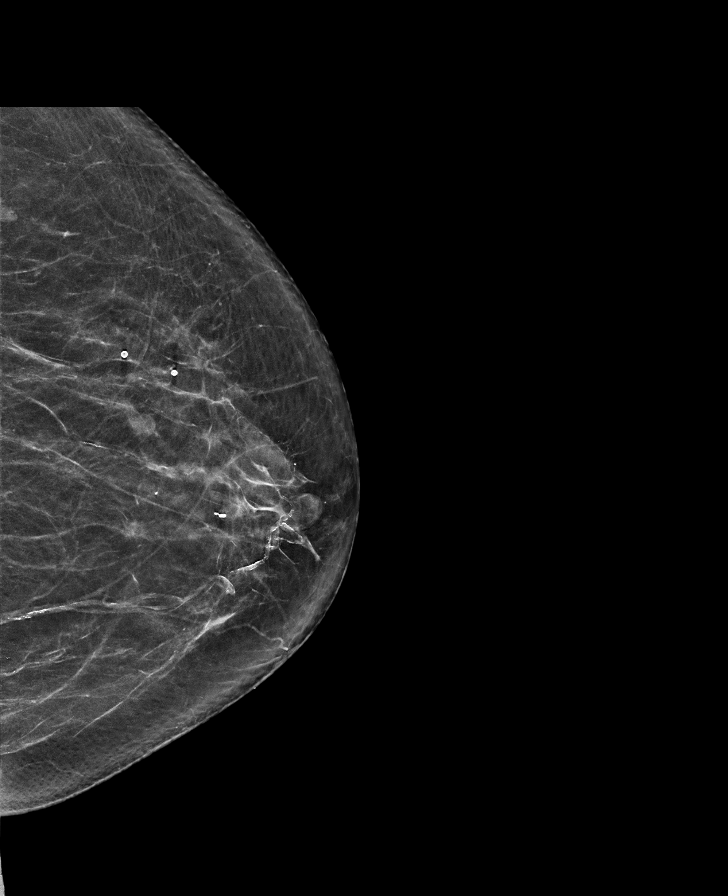

[L CC tomo · tomo slice 35/68.0]
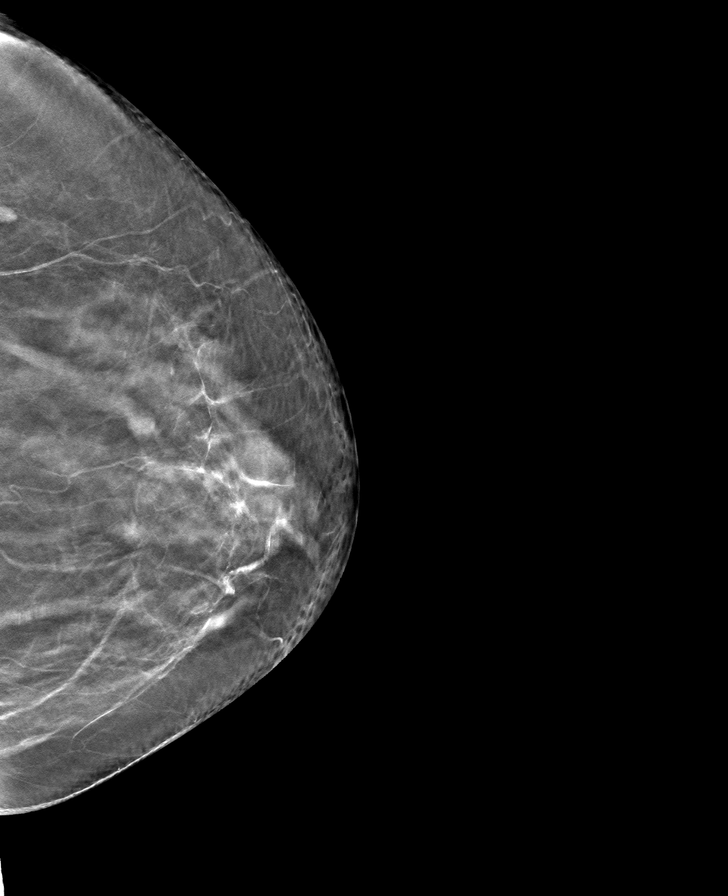

[R MLO tomo · tomo slice 33/65.0]
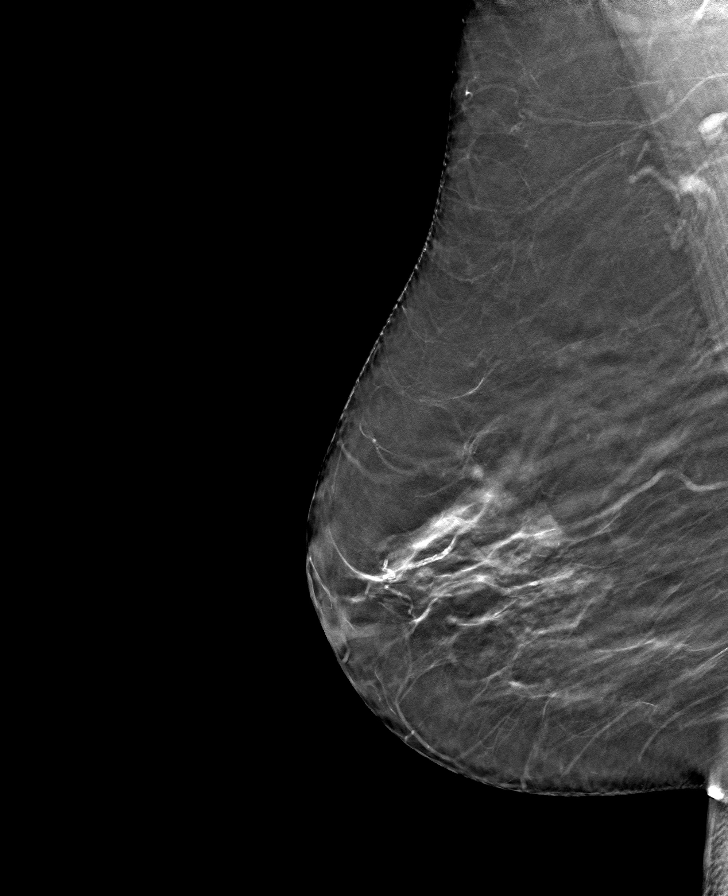

[R CC tomo · tomo slice 27/53.0]
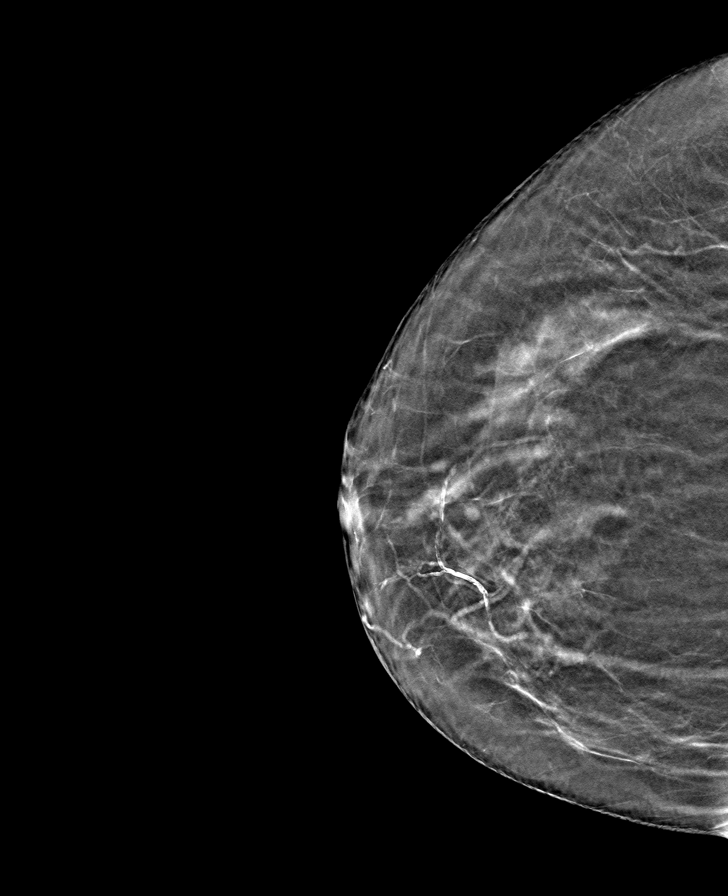

[L MLO tomo · tomo slice 31/61.0]
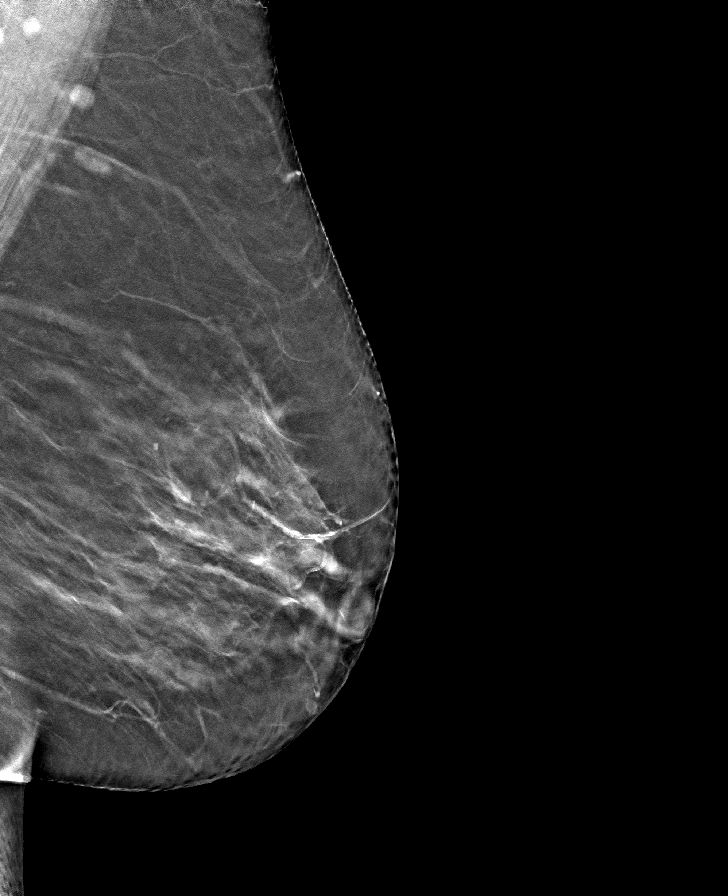

[8 of 24 positions shown; findings below may reference images not displayed]

ACR Breast Density Category b: There are scattered areas of
fibroglandular density.
FINDINGS: There are no findings suspicious for malignancy. Images were
processed with CAD.
IMPRESSION: No mammographic evidence of malignancy. A result letter of this
screening mammogram will be mailed directly to the patient.

RECOMMENDATION:
Screening mammogram in one year. (Code:[id number])

BI-RADS CATEGORY  1: Negative.

## 2022-06-24 ENCOUNTER — Ambulatory Visit (INDEPENDENT_AMBULATORY_CARE_PROVIDER_SITE_OTHER): Payer: Medicare Other | Admitting: Internal Medicine

## 2022-06-24 ENCOUNTER — Other Ambulatory Visit: Payer: Self-pay | Admitting: Internal Medicine

## 2022-06-24 ENCOUNTER — Encounter: Payer: Self-pay | Admitting: Internal Medicine

## 2022-06-24 VITALS — BP 184/110 | HR 97 | Temp 96.8°F | Ht 64.0 in | Wt 176.0 lb

## 2022-06-24 DIAGNOSIS — Z136 Encounter for screening for cardiovascular disorders: Secondary | ICD-10-CM

## 2022-06-24 DIAGNOSIS — Z6829 Body mass index (BMI) 29.0-29.9, adult: Secondary | ICD-10-CM | POA: Insufficient documentation

## 2022-06-24 DIAGNOSIS — Z853 Personal history of malignant neoplasm of breast: Secondary | ICD-10-CM | POA: Diagnosis not present

## 2022-06-24 DIAGNOSIS — I10 Essential (primary) hypertension: Secondary | ICD-10-CM | POA: Insufficient documentation

## 2022-06-24 DIAGNOSIS — Z1159 Encounter for screening for other viral diseases: Secondary | ICD-10-CM

## 2022-06-24 DIAGNOSIS — E2839 Other primary ovarian failure: Secondary | ICD-10-CM

## 2022-06-24 DIAGNOSIS — Z1211 Encounter for screening for malignant neoplasm of colon: Secondary | ICD-10-CM

## 2022-06-24 DIAGNOSIS — Z1231 Encounter for screening mammogram for malignant neoplasm of breast: Secondary | ICD-10-CM

## 2022-06-24 DIAGNOSIS — N63 Unspecified lump in unspecified breast: Secondary | ICD-10-CM

## 2022-06-24 DIAGNOSIS — Z6827 Body mass index (BMI) 27.0-27.9, adult: Secondary | ICD-10-CM | POA: Insufficient documentation

## 2022-06-24 DIAGNOSIS — R7309 Other abnormal glucose: Secondary | ICD-10-CM

## 2022-06-24 DIAGNOSIS — E6609 Other obesity due to excess calories: Secondary | ICD-10-CM | POA: Insufficient documentation

## 2022-06-24 DIAGNOSIS — Z6830 Body mass index (BMI) 30.0-30.9, adult: Secondary | ICD-10-CM

## 2022-06-24 HISTORY — DX: Essential (primary) hypertension: I10

## 2022-06-24 LAB — CBC
HCT: 41.3 % (ref 35.0–45.0)
Hemoglobin: 13.3 g/dL (ref 11.7–15.5)
MCH: 27.4 pg (ref 27.0–33.0)
MCHC: 32.2 g/dL (ref 32.0–36.0)
MCV: 85 fL (ref 80.0–100.0)
MPV: 11 fL (ref 7.5–12.5)
WBC: 8.8 10*3/uL (ref 3.8–10.8)

## 2022-06-24 MED ORDER — AMLODIPINE-OLMESARTAN 5-20 MG PO TABS: 1.0000 | ORAL_TABLET | Freq: Every day | ORAL | 0 refills | Status: DC

## 2022-06-24 NOTE — Assessment & Plan Note (Signed)
Previously on tamoxifen for 5 years, now off We will restart yearly mammogram screening

## 2022-06-24 NOTE — Patient Instructions (Signed)

## 2022-06-24 NOTE — Progress Notes (Signed)
HPI  Patient presents to clinic today to establish care and for management of the conditions listed below.  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  She is no longer taking Tamoxifen as prescribed.  She has not been getting yearly mammograms.  Of note, her BP today is 184/110. She has never been diagnosed with HTN in the past. There is no ECG on file.  Past Medical History:  Diagnosis Date   Colon polyp     Current Outpatient Medications  Medication Sig Dispense Refill   aspirin 81 MG tablet Take 81 mg by mouth daily.     Cholecalciferol (VITAMIN D3 PO) Take 500 mg by mouth daily.     Omega-3 Fatty Acids (FISH OIL PO) Take by mouth daily.     tamoxifen (NOLVADEX) 20 MG tablet TAKE 1 TABLET DAILY 90 tablet 3   No current facility-administered medications for this visit.    No Known Allergies  Family History  Problem Relation Age of Onset   Breast cancer Mother 30       Low grade DCIS   Cancer Sister 52       breast   Cancer Maternal Aunt        breast/great Aunt    Social History   Socioeconomic History   Marital status: Married    Spouse name: Not on file   Number of children: Not on file   Years of education: Not on file   Highest education level: Not on file  Occupational History   Not on file  Tobacco Use   Smoking status: Former    Packs/day: 1.00    Years: 30.00    Total pack years: 30.00    Types: Cigarettes    Quit date: 04/29/1999    Years since quitting: 23.1   Smokeless tobacco: Never  Substance and Sexual Activity   Alcohol use: No    Alcohol/week: 0.0 standard drinks of alcohol   Drug use: No   Sexual activity: Not on file  Other Topics Concern   Not on file  Social History Narrative   Not on file   Social Determinants of Health   Financial Resource Strain: Not on file  Food Insecurity: Not on file  Transportation Needs: Not on file  Physical Activity: Not on file  Stress: Not on file  Social  Connections: Not on file  Intimate Partner Violence: Not on file    ROS:  Constitutional: Denies fever, malaise, fatigue, headache or abrupt weight changes.  HEENT: Denies eye pain, eye redness, ear pain, ringing in the ears, wax buildup, runny nose, nasal congestion, bloody nose, or sore throat. Respiratory: Denies difficulty breathing, shortness of breath, cough or sputum production.   Cardiovascular: Denies chest pain, chest tightness, palpitations or swelling in the hands or feet.  Gastrointestinal: Denies abdominal pain, bloating, constipation, diarrhea or blood in the stool.  GU: Denies frequency, urgency, pain with urination, blood in urine, odor or discharge. Musculoskeletal: Pt reports intermittent swelling in right ankle. Denies decrease in range of motion, difficulty with gait, muscle pain or joint swelling.  Skin: Denies redness, rashes, lesions or ulcercations.  Neurological: Denies dizziness, difficulty with memory, difficulty with speech or problems with balance and coordination.  Psych: Denies anxiety, depression, SI/HI.  No other specific complaints in a complete review of systems (except as listed in HPI above).  PE:  BP (!) 184/110 (BP Location: Left Arm, Patient Position: Sitting, Cuff Size: Normal)   Pulse  97   Temp (!) 96.8 F (36 C) (Temporal)   Ht '5\' 4"'$  (1.626 m)   Wt 176 lb (79.8 kg)   SpO2 100%   BMI 30.21 kg/m   Wt Readings from Last 3 Encounters:  11/02/18 213 lb (96.6 kg)  05/19/17 226 lb (102.5 kg)  05/13/16 244 lb (110.7 kg)    General: Appears her stated age,obese, in NAD. HEENT: Head: normal shape and size; Eyes: sclera white, no icterus, conjunctiva pink, PERRLA and EOMs intact;  Cardiovascular: Normal rate and rhythm. S1,S2 noted.  No murmur, rubs or gallops noted. No JVD or BLE edema. No carotid bruits noted. Pulmonary/Chest: Normal effort and positive vesicular breath sounds. No respiratory distress. No wheezes, rales or ronchi noted.   Musculoskeletal:  No difficulty with gait.  Neurological: Alert and oriented. Coordination normal.  Psychiatric: Mood and affect normal. Behavior is normal. Judgment and thought content normal.     Assessment and Plan:    RTC in 2 week follow up HTN, 6 months for annual exam Webb Silversmith, NP

## 2022-06-24 NOTE — Assessment & Plan Note (Signed)
Will start amlodipine-olmesartan 5-20 mg daily C-Met today Reinforced DASH diet and exercise for weight loss

## 2022-06-24 NOTE — Assessment & Plan Note (Signed)
Encourage diet and exercise for weight loss 

## 2022-06-25 ENCOUNTER — Encounter: Payer: Self-pay | Admitting: Internal Medicine

## 2022-06-25 DIAGNOSIS — E782 Mixed hyperlipidemia: Secondary | ICD-10-CM

## 2022-06-25 LAB — LIPID PANEL
Cholesterol: 192 mg/dL (ref <200)
HDL: 49 mg/dL — ABNORMAL LOW (ref >=50)
LDL Cholesterol (Calc): 115 mg/dL (calc) — ABNORMAL HIGH
Non-HDL Cholesterol (Calc): 143 mg/dL (calc) — ABNORMAL HIGH (ref <130)
Total CHOL/HDL Ratio: 3.9 (calc) (ref <5.0)
Triglycerides: 168 mg/dL — ABNORMAL HIGH (ref <150)

## 2022-06-25 LAB — COMPLETE METABOLIC PANEL WITH GFR
AG Ratio: 1.1 (calc) (ref 1.0–2.5)
ALT: 14 U/L (ref 6–29)
AST: 20 U/L (ref 10–35)
Albumin: 3.8 g/dL (ref 3.6–5.1)
Alkaline phosphatase (APISO): 63 U/L (ref 37–153)
BUN: 12 mg/dL (ref 7–25)
CO2: 30 mmol/L (ref 20–32)
Calcium: 9.4 mg/dL (ref 8.6–10.4)
Chloride: 103 mmol/L (ref 98–110)
Creat: 0.85 mg/dL (ref 0.60–1.00)
Globulin: 3.5 g/dL (calc) (ref 1.9–3.7)
Glucose, Bld: 89 mg/dL (ref 65–99)
Potassium: 4.5 mmol/L (ref 3.5–5.3)
Sodium: 142 mmol/L (ref 135–146)
Total Bilirubin: 0.8 mg/dL (ref 0.2–1.2)
Total Protein: 7.3 g/dL (ref 6.1–8.1)
eGFR: 73 mL/min/{1.73_m2} (ref >=60)

## 2022-06-25 LAB — HEMOGLOBIN A1C
Hgb A1c MFr Bld: 5.4 % of total Hgb (ref <5.7)
Mean Plasma Glucose: 108 mg/dL
eAG (mmol/L): 6 mmol/L

## 2022-06-25 LAB — CBC
Platelets: 233 10*3/uL (ref 140–400)
RBC: 4.86 10*6/uL (ref 3.80–5.10)
RDW: 14.2 % (ref 11.0–15.0)

## 2022-06-25 LAB — HEPATITIS C ANTIBODY: Hepatitis C Ab: NONREACTIVE

## 2022-06-25 MED ORDER — ATORVASTATIN CALCIUM 10 MG PO TABS: 10.0000 mg | ORAL_TABLET | Freq: Every day | ORAL | 1 refills | Status: DC

## 2022-07-03 ENCOUNTER — Encounter: Payer: Self-pay | Admitting: *Deleted

## 2022-07-03 ENCOUNTER — Telehealth: Payer: Self-pay | Admitting: Gastroenterology

## 2022-07-03 ENCOUNTER — Telehealth: Payer: Self-pay | Admitting: *Deleted

## 2022-07-03 ENCOUNTER — Other Ambulatory Visit: Payer: Self-pay | Admitting: *Deleted

## 2022-07-03 DIAGNOSIS — Z1211 Encounter for screening for malignant neoplasm of colon: Secondary | ICD-10-CM

## 2022-07-03 MED ORDER — NA SULFATE-K SULFATE-MG SULF 17.5-3.13-1.6 GM/177ML PO SOLN: 1.0000 | Freq: Once | ORAL | 0 refills | Status: AC

## 2022-07-03 NOTE — Telephone Encounter (Signed)
Gastroenterology Pre-Procedure Review  Request Date: 07/30/2022 Requesting Physician: Dr. Vicente Males  PATIENT REVIEW QUESTIONS: The patient responded to the following health history questions as indicated:    1. Are you having any GI issues? no 2. Do you have a personal history of Polyps? no 3. Do you have a family history of Colon Cancer or Polyps? no 4. Diabetes Mellitus? no 5. Joint replacements in the past 12 months?no 6. Major health problems in the past 3 months?no 7. Any artificial heart valves, MVP, or defibrillator?no    MEDICATIONS & ALLERGIES:    Patient reports the following regarding taking any anticoagulation/antiplatelet therapy:   Plavix, Coumadin, Eliquis, Xarelto, Lovenox, Pradaxa, Brilinta, or Effient? no Aspirin? no  Patient confirms/reports the following medications:  Current Outpatient Medications  Medication Sig Dispense Refill   amLODipine-olmesartan (AZOR) 5-20 MG tablet Take 1 tablet by mouth daily. 30 tablet 0   atorvastatin (LIPITOR) 10 MG tablet Take 1 tablet (10 mg total) by mouth daily. 90 tablet 1   Multiple Vitamin (MULTIVITAMIN) tablet Take 1 tablet by mouth daily.     No current facility-administered medications for this visit.    Patient confirms/reports the following allergies:  No Known Allergies  No orders of the defined types were placed in this encounter.   AUTHORIZATION INFORMATION Primary Insurance: 1D#: Group #:  Secondary Insurance: 1D#: Group #:  SCHEDULE INFORMATION: Date: 07/30/2022 Time: Location: [place]

## 2022-07-03 NOTE — Telephone Encounter (Signed)
Pt left message to schedule colonoscopy please return call

## 2022-07-08 ENCOUNTER — Encounter: Payer: Self-pay | Admitting: Internal Medicine

## 2022-07-08 ENCOUNTER — Ambulatory Visit (INDEPENDENT_AMBULATORY_CARE_PROVIDER_SITE_OTHER): Payer: Medicare Other | Admitting: Internal Medicine

## 2022-07-08 VITALS — BP 162/92 | HR 88 | Temp 96.4°F | Wt 171.0 lb

## 2022-07-08 DIAGNOSIS — R062 Wheezing: Secondary | ICD-10-CM | POA: Diagnosis not present

## 2022-07-08 DIAGNOSIS — I10 Essential (primary) hypertension: Secondary | ICD-10-CM

## 2022-07-08 DIAGNOSIS — E663 Overweight: Secondary | ICD-10-CM | POA: Diagnosis not present

## 2022-07-08 DIAGNOSIS — Z6829 Body mass index (BMI) 29.0-29.9, adult: Secondary | ICD-10-CM | POA: Diagnosis not present

## 2022-07-08 MED ORDER — AMLODIPINE-OLMESARTAN 5-20 MG PO TABS: 2.0000 | ORAL_TABLET | Freq: Every day | ORAL | 0 refills | Status: DC

## 2022-07-08 NOTE — Assessment & Plan Note (Signed)
Discussed blood pressure goal of 130/80-140/90 Increase amlodipine-olmesartan to 10-40 mg daily Reinforced DASH diet and exercise for weight loss

## 2022-07-08 NOTE — Progress Notes (Signed)
Subjective:    Patient ID: Victoria Clarke, female    DOB: [birth year], 72 y.o.   MRN: [id number]  HPI  Patient presents to clinic today for 2-week follow-up of HTN.  At her last visit, she was started on Amlodipine-Olmesartan.  She has been taking the medication as prescribed.  Her BP today is 176/106.  She checks it at home, but her blood pressure cuff appears to be 30 pts off. It is a wrist cuff. There is no ECG on file.  Review of Systems     Past Medical History:  Diagnosis Date   Colon polyp     Current Outpatient Medications  Medication Sig Dispense Refill   amLODipine-olmesartan (AZOR) 5-20 MG tablet Take 1 tablet by mouth daily. 30 tablet 0   atorvastatin (LIPITOR) 10 MG tablet Take 1 tablet (10 mg total) by mouth daily. 90 tablet 1   Multiple Vitamin (MULTIVITAMIN) tablet Take 1 tablet by mouth daily.     No current facility-administered medications for this visit.    No Known Allergies  Family History  Problem Relation Age of Onset   Breast cancer Mother 92       Low grade DCIS   Dementia Mother    Dementia Father    Colon polyps Father    Breast cancer Sister 53       breast   Healthy Sister    Cancer Maternal Aunt        breast/great Aunt    Social History   Socioeconomic History   Marital status: Married    Spouse name: Not on file   Number of children: Not on file   Years of education: Not on file   Highest education level: Not on file  Occupational History   Not on file  Tobacco Use   Smoking status: Former    Packs/day: 1.00    Years: 30.00    Total pack years: 30.00    Types: Cigarettes    Quit date: 04/29/1999    Years since quitting: 23.2   Smokeless tobacco: Never  Vaping Use   Vaping Use: Never used  Substance and Sexual Activity   Alcohol use: No    Alcohol/week: 0.0 standard drinks of alcohol   Drug use: No   Sexual activity: Not on file  Other Topics Concern   Not on file  Social History Narrative   Not on file   Social  Determinants of Health   Financial Resource Strain: Not on file  Food Insecurity: Not on file  Transportation Needs: Not on file  Physical Activity: Not on file  Stress: Not on file  Social Connections: Not on file  Intimate Partner Violence: Not on file     Constitutional: Denies fever, malaise, fatigue, headache or abrupt weight changes.  Respiratory: Denies difficulty breathing, shortness of breath, cough or sputum production.   Cardiovascular: Denies chest pain, chest tightness, palpitations or swelling in the hands or feet.  Neurological: Denies dizziness, difficulty with memory, difficulty with speech or problems with balance and coordination.    No other specific complaints in a complete review of systems (except as listed in HPI above).  Objective:   Physical Exam   BP (!) 162/92 (BP Location: Right Arm, Patient Position: Sitting, Cuff Size: Normal)   Pulse 88   Temp (!) 96.4 F (35.8 C) (Temporal)   Wt 171 lb (77.6 kg)   SpO2 96%   BMI 29.35 kg/m   Wt Readings from Last 3 Encounters:  06/24/22 176 lb (79.8 kg)  11/02/18 213 lb (96.6 kg)  05/19/17 226 lb (102.5 kg)    General: Appears her stated age, overweight in NAD. HEENT: Head: normal shape and size; Eyes: sclera white, no icterus, conjunctiva pink, PERRLA and EOMs intact;  Cardiovascular: Normal rate and rhythm. S1,S2 noted.  No murmur, rubs or gallops noted. No JVD or BLE edema.  Pulmonary/Chest: Normal effort and positive vesicular breath sounds with intermittent expiratory wheeze. No respiratory distress. No rales or ronchi noted.  Musculoskeletal:No difficulty with gait.  Neurological: Alert and oriented.  Coordination normal.    BMET    Component Value Date/Time   NA 142 06/24/2022 1028   K 4.5 06/24/2022 1028   CL 103 06/24/2022 1028   CO2 30 06/24/2022 1028   GLUCOSE 89 06/24/2022 1028   BUN 12 06/24/2022 1028   CREATININE 0.85 06/24/2022 1028   CALCIUM 9.4 06/24/2022 1028    Lipid Panel      Component Value Date/Time   CHOL 192 06/24/2022 1028   TRIG 168 (H) 06/24/2022 1028   HDL 49 (L) 06/24/2022 1028   CHOLHDL 3.9 06/24/2022 1028   LDLCALC 115 (H) 06/24/2022 1028    CBC    Component Value Date/Time   WBC 8.8 06/24/2022 1028   RBC 4.86 06/24/2022 1028   HGB 13.3 06/24/2022 1028   HCT 41.3 06/24/2022 1028   PLT 233 06/24/2022 1028   MCV 85.0 06/24/2022 1028   MCH 27.4 06/24/2022 1028   MCHC 32.2 06/24/2022 1028   RDW 14.2 06/24/2022 1028    Hgb A1C Lab Results  Component Value Date   HGBA1C 5.4 06/24/2022           Assessment & Plan:   Wheeze:  Intermittent She thinks it may be allergy induced She does not have chest tightness or feels short of breath Offered Rx for Albuterol to take as needed for wheezing but she declines at this time  RTC in 1 week, follow-up HTN, 5 months for annual exam Webb Silversmith, NP

## 2022-07-08 NOTE — Patient Instructions (Signed)
Bronchospasm, Adult  Bronchospasm is when the small airways in the lungs narrow. This can make it very hard to breathe. Swelling and more mucus than normal can add to this problem. What are the causes? Having a cold. Exercise. The smell from sprays, perfumes, candles, and cleaners. Cold air. Stress or strong feelings such as laughing or crying. What increases the risk? Having asthma. Smoking. Being around someone who smokes (secondhand smoke). Having allergies. Being allergic to certain foods, medicine, or bug bites or stings. What are the signs or symptoms? Making a high-pitched whistling sound when you breathe, most often when you breathe out (wheezing). Coughing. A tight feeling in your chest. Feeling like you cannot catch your breath. Feeling like you have no energy to exercise. Breathing that is noisy. A cough that has a high pitch. How is this treated?  Using medicines that you breathe in (inhale). These open up the airways and help you breathe. Medicines can be taken with a metered dose inhaler or a nebulizer device. Taking medicines to reduce swelling. Getting rid of what started the bronchospasm. Follow these instructions at home: Medicines Take over-the-counter and prescription medicines only as told by your doctor. If you need to use an inhaler or nebulizer to take your medicine, ask your doctor how to use it. You may be given a spacer to use with your inhaler. This makes it easier to get the medicine from the inhaler into your lungs. Lifestyle Do not smoke or use any products that contain nicotine or tobacco. If you need help quitting, ask your doctor. Keep track of things that start your bronchospasm. Avoid these if you can. When pollen, air pollution, or humidity is bad, keep windows closed. Use an air conditioner if you have one. Find ways to cope with stress and your feelings. Activity Some people have bronchospasm when they exercise. This is called  exercise-induced bronchoconstriction (EIB). If you have this problem, talk with your doctor about how to deal with EIB. Some tips include: Use your inhaler before exercise. Exercise indoors if it is very cold or humid, or if the pollen and mold counts are high. Warm up and cool down before and after exercise. Stop your exercise right away if your symptoms start or get worse. General instructions If you have asthma, make sure you have an asthma action plan. Stay up to date on your shots (immunizations). Keep all follow-up visits. Get help right away if: You have trouble breathing. You wheeze and cough and this does not get better after you take medicine. You have chest pain. You have trouble speaking more than one word in a sentence. These symptoms may be an emergency. Get help right away. Call 911. Do not wait to see if the symptoms will go away. Do not drive yourself to the hospital. Summary Bronchospasm is when the small airways in the lungs narrow. Swelling and more mucus than normal can add to this problem. This can make it very hard to breathe. Do not smoke or use any products that contain nicotine or tobacco. If you need help quitting, ask your doctor. Get help right away if you wheeze and cough and this does not get better after you take medicine. This information is not intended to replace advice given to you by your health care provider. Make sure you discuss any questions you have with your health care provider. Document Revised: 11/05/2020 Document Reviewed: 11/05/2020 Elsevier Patient Education  2023 [employer]  

## 2022-07-08 NOTE — Assessment & Plan Note (Signed)
Encourage diet and exercise for weight loss 

## 2022-07-15 ENCOUNTER — Ambulatory Visit (INDEPENDENT_AMBULATORY_CARE_PROVIDER_SITE_OTHER): Payer: Medicare Other | Admitting: Internal Medicine

## 2022-07-15 ENCOUNTER — Encounter: Payer: Self-pay | Admitting: Internal Medicine

## 2022-07-15 VITALS — BP 138/76 | HR 84 | Temp 97.3°F | Wt 181.0 lb

## 2022-07-15 DIAGNOSIS — I10 Essential (primary) hypertension: Secondary | ICD-10-CM | POA: Diagnosis not present

## 2022-07-15 DIAGNOSIS — E6609 Other obesity due to excess calories: Secondary | ICD-10-CM

## 2022-07-15 DIAGNOSIS — Z6831 Body mass index (BMI) 31.0-31.9, adult: Secondary | ICD-10-CM | POA: Diagnosis not present

## 2022-07-15 MED ORDER — AMLODIPINE-OLMESARTAN 10-40 MG PO TABS: 1.0000 | ORAL_TABLET | Freq: Every day | ORAL | 1 refills | Status: DC

## 2022-07-15 MED ORDER — AMLODIPINE-OLMESARTAN 10-40 MG PO TABS: 1.0000 | ORAL_TABLET | Freq: Every day | ORAL | 0 refills | Status: DC

## 2022-07-15 NOTE — Progress Notes (Signed)
Subjective:    Patient ID: Victoria Clarke, female    DOB: [birth year], 72 y.o.   MRN: [id number]  HPI  Patient presents to clinic today for follow-up of HTN.  At her last visit, her Amlodipine-Olmesartan was increased to 10-40 mg daily.  She has been taking the medication as prescribed.  Her BP today is 158/82.  There is no ECG on file.  Review of Systems  Past Medical History:  Diagnosis Date   Colon polyp     Current Outpatient Medications  Medication Sig Dispense Refill   amLODipine-olmesartan (AZOR) 5-20 MG tablet Take 2 tablets by mouth daily. 30 tablet 0   atorvastatin (LIPITOR) 10 MG tablet Take 1 tablet (10 mg total) by mouth daily. 90 tablet 1   Multiple Vitamin (MULTIVITAMIN) tablet Take 1 tablet by mouth daily.     No current facility-administered medications for this visit.    No Known Allergies  Family History  Problem Relation Age of Onset   Breast cancer Mother 42       Low grade DCIS   Dementia Mother    Dementia Father    Colon polyps Father    Breast cancer Sister 51       breast   Healthy Sister    Cancer Maternal Aunt        breast/great Aunt    Social History   Socioeconomic History   Marital status: Married    Spouse name: Not on file   Number of children: Not on file   Years of education: Not on file   Highest education level: Not on file  Occupational History   Not on file  Tobacco Use   Smoking status: Former    Packs/day: 1.00    Years: 30.00    Additional pack years: 0.00    Total pack years: 30.00    Types: Cigarettes    Quit date: 04/29/1999    Years since quitting: 23.2   Smokeless tobacco: Never  Vaping Use   Vaping Use: Never used  Substance and Sexual Activity   Alcohol use: No    Alcohol/week: 0.0 standard drinks of alcohol   Drug use: No   Sexual activity: Not on file  Other Topics Concern   Not on file  Social History Narrative   Not on file   Social Determinants of Health   Financial Resource Strain: Not on  file  Food Insecurity: Not on file  Transportation Needs: Not on file  Physical Activity: Not on file  Stress: Not on file  Social Connections: Not on file  Intimate Partner Violence: Not on file     Constitutional: Denies fever, malaise, fatigue, headache or abrupt weight changes.  Respiratory: Denies difficulty breathing, shortness of breath, cough or sputum production.   Cardiovascular: Denies chest pain, chest tightness, palpitations or swelling in the hands or feet.  Musculoskeletal: Denies decrease in range of motion, difficulty with gait, muscle pain or joint pain and swelling.  Neurological: Denies dizziness, difficulty with memory, difficulty with speech or problems with balance and coordination.    No other specific complaints in a complete review of systems (except as listed in HPI above).     Objective:   Physical Exam   BP 138/76 (BP Location: Right Arm, Patient Position: Sitting, Cuff Size: Normal)   Pulse 84   Temp (!) 97.3 F (36.3 C) (Temporal)   Wt 181 lb (82.1 kg)   BMI 31.07 kg/m   Wt Readings from Last 3 Encounters:  07/08/22 171 lb (77.6 kg)  06/24/22 176 lb (79.8 kg)  11/02/18 213 lb (96.6 kg)    General: Appears her stated age, obese, n NAD. Cardiovascular: Normal rate and rhythm. S1,S2 noted.  No murmur, rubs or gallops noted. No JVD or BLE edema.  Pulmonary/Chest: Normal effort and positive vesicular breath sounds. No respiratory distress. No wheezes, rales or ronchi noted.  Musculoskeletal: No difficulty with gait.  Neurological: Alert and oriented. Coordination normal.     BMET    Component Value Date/Time   NA 142 06/24/2022 1028   K 4.5 06/24/2022 1028   CL 103 06/24/2022 1028   CO2 30 06/24/2022 1028   GLUCOSE 89 06/24/2022 1028   BUN 12 06/24/2022 1028   CREATININE 0.85 06/24/2022 1028   CALCIUM 9.4 06/24/2022 1028    Lipid Panel     Component Value Date/Time   CHOL 192 06/24/2022 1028   TRIG 168 (H) 06/24/2022 1028   HDL  49 (L) 06/24/2022 1028   CHOLHDL 3.9 06/24/2022 1028   LDLCALC 115 (H) 06/24/2022 1028    CBC    Component Value Date/Time   WBC 8.8 06/24/2022 1028   RBC 4.86 06/24/2022 1028   HGB 13.3 06/24/2022 1028   HCT 41.3 06/24/2022 1028   PLT 233 06/24/2022 1028   MCV 85.0 06/24/2022 1028   MCH 27.4 06/24/2022 1028   MCHC 32.2 06/24/2022 1028   RDW 14.2 06/24/2022 1028    Hgb A1C Lab Results  Component Value Date   HGBA1C 5.4 06/24/2022           Assessment & Plan:      RTC in 5 months for annual exam Webb Silversmith, NP

## 2022-07-15 NOTE — Patient Instructions (Signed)
Hypertension, Adult High blood pressure (hypertension) is when the force of blood pumping through the arteries is too strong. The arteries are the blood vessels that carry blood from the heart throughout the body. Hypertension forces the heart to work harder to pump blood and may cause arteries to become narrow or stiff. Untreated or uncontrolled hypertension can lead to a heart attack, heart failure, a stroke, kidney disease, and other problems. A blood pressure reading consists of a higher number over a lower number. Ideally, your blood pressure should be below 120/80. The first ("top") number is called the systolic pressure. It is a measure of the pressure in your arteries as your heart beats. The second ("bottom") number is called the diastolic pressure. It is a measure of the pressure in your arteries as the heart relaxes. What are the causes? The exact cause of this condition is not known. There are some conditions that result in high blood pressure. What increases the risk? Certain factors may make you more likely to develop high blood pressure. Some of these risk factors are under your control, including: Smoking. Not getting enough exercise or physical activity. Being overweight. Having too much fat, sugar, calories, or salt (sodium) in your diet. Drinking too much alcohol. Other risk factors include: Having a personal history of heart disease, diabetes, high cholesterol, or kidney disease. Stress. Having a family history of high blood pressure and high cholesterol. Having obstructive sleep apnea. Age. The risk increases with age. What are the signs or symptoms? High blood pressure may not cause symptoms. Very high blood pressure (hypertensive crisis) may cause: Headache. Fast or irregular heartbeats (palpitations). Shortness of breath. Nosebleed. Nausea and vomiting. Vision changes. Severe chest pain, dizziness, and seizures. How is this diagnosed? This condition is diagnosed by  measuring your blood pressure while you are seated, with your arm resting on a flat surface, your legs uncrossed, and your feet flat on the floor. The cuff of the blood pressure monitor will be placed directly against the skin of your upper arm at the level of your heart. Blood pressure should be measured at least twice using the same arm. Certain conditions can cause a difference in blood pressure between your right and left arms. If you have a high blood pressure reading during one visit or you have normal blood pressure with other risk factors, you may be asked to: Return on a different day to have your blood pressure checked again. Monitor your blood pressure at home for 1 week or longer. If you are diagnosed with hypertension, you may have other blood or imaging tests to help your health care provider understand your overall risk for other conditions. How is this treated? This condition is treated by making healthy lifestyle changes, such as eating healthy foods, exercising more, and reducing your alcohol intake. You may be referred for counseling on a healthy diet and physical activity. Your health care provider may prescribe medicine if lifestyle changes are not enough to get your blood pressure under control and if: Your systolic blood pressure is above 130. Your diastolic blood pressure is above 80. Your personal target blood pressure may vary depending on your medical conditions, your age, and other factors. Follow these instructions at home: Eating and drinking  Eat a diet that is high in fiber and potassium, and low in sodium, added sugar, and fat. An example of this eating plan is called the DASH diet. DASH stands for Dietary Approaches to Stop Hypertension. To eat this way: Eat   plenty of fresh fruits and vegetables. Try to fill one half of your plate at each meal with fruits and vegetables. Eat whole grains, such as whole-wheat pasta, brown rice, or whole-grain bread. Fill about one  fourth of your plate with whole grains. Eat or drink low-fat dairy products, such as skim milk or low-fat yogurt. Avoid fatty cuts of meat, processed or cured meats, and poultry with skin. Fill about one fourth of your plate with lean proteins, such as fish, chicken without skin, beans, eggs, or tofu. Avoid pre-made and processed foods. These tend to be higher in sodium, added sugar, and fat. Reduce your daily sodium intake. Many people with hypertension should eat less than 1,500 mg of sodium a day. Do not drink alcohol if: Your health care provider tells you not to drink. You are pregnant, may be pregnant, or are planning to become pregnant. If you drink alcohol: Limit how much you have to: 0-1 drink a day for women. 0-2 drinks a day for men. Know how much alcohol is in your drink. In the [place] one drink equals one 12 oz bottle of beer (355 mL), one 5 oz glass of wine (148 mL), or one 1 oz glass of hard liquor (44 mL). Lifestyle  Work with your health care provider to maintain a healthy body weight or to lose weight. Ask what an ideal weight is for you. Get at least 30 minutes of exercise that causes your heart to beat faster (aerobic exercise) most days of the week. Activities may include walking, swimming, or biking. Include exercise to strengthen your muscles (resistance exercise), such as Pilates or lifting weights, as part of your weekly exercise routine. Try to do these types of exercises for 30 minutes at least 3 days a week. Do not use any products that contain nicotine or tobacco. These products include cigarettes, chewing tobacco, and vaping devices, such as e-cigarettes. If you need help quitting, ask your health care provider. Monitor your blood pressure at home as told by your health care provider. Keep all follow-up visits. This is important. Medicines Take over-the-counter and prescription medicines only as told by your health care provider. Follow directions carefully. Blood  pressure medicines must be taken as prescribed. Do not skip doses of blood pressure medicine. Doing this puts you at risk for problems and can make the medicine less effective. Ask your health care provider about side effects or reactions to medicines that you should watch for. Contact a health care provider if you: Think you are having a reaction to a medicine you are taking. Have headaches that keep coming back (recurring). Feel dizzy. Have swelling in your ankles. Have trouble with your vision. Get help right away if you: Develop a severe headache or confusion. Have unusual weakness or numbness. Feel faint. Have severe pain in your chest or abdomen. Vomit repeatedly. Have trouble breathing. These symptoms may be an emergency. Get help right away. Call 911. Do not wait to see if the symptoms will go away. Do not drive yourself to the hospital. Summary Hypertension is when the force of blood pumping through your arteries is too strong. If this condition is not controlled, it may put you at risk for serious complications. Your personal target blood pressure may vary depending on your medical conditions, your age, and other factors. For most people, a normal blood pressure is less than 120/80. Hypertension is treated with lifestyle changes, medicines, or a combination of both. Lifestyle changes include losing weight, eating a healthy,   low-sodium diet, exercising more, and limiting alcohol. This information is not intended to replace advice given to you by your health care provider. Make sure you discuss any questions you have with your health care provider. Document Revised: 02/19/2021 Document Reviewed: 02/19/2021 Elsevier Patient Education  2023 [employer]  

## 2022-07-15 NOTE — Assessment & Plan Note (Signed)
Elevated on initial reading but manual repeat 138/76 Will continue amlodipine-olmesartan 10-40 mg daily Reinforced DASH diet and exercise for weight loss

## 2022-07-15 NOTE — Assessment & Plan Note (Signed)
Encourage diet and exercise for weight loss 

## 2022-07-30 ENCOUNTER — Encounter
Admission: RE | Disposition: A | Discharge status: Home / Self Care | Payer: Self-pay | Source: Home / Self Care | Attending: Gastroenterology

## 2022-07-30 ENCOUNTER — Encounter: Payer: Self-pay | Admitting: Gastroenterology

## 2022-07-30 ENCOUNTER — Ambulatory Visit: Payer: Medicare Other | Admitting: Anesthesiology

## 2022-07-30 ENCOUNTER — Ambulatory Visit
Admission: RE | Admit: 2022-07-30 | Discharge: 2022-07-30 | Disposition: A | Discharge status: Home / Self Care | Payer: Medicare Other | Attending: Gastroenterology | Admitting: Gastroenterology

## 2022-07-30 DIAGNOSIS — K5730 Diverticulosis of large intestine without perforation or abscess without bleeding: Secondary | ICD-10-CM | POA: Diagnosis not present

## 2022-07-30 DIAGNOSIS — I10 Essential (primary) hypertension: Secondary | ICD-10-CM | POA: Insufficient documentation

## 2022-07-30 DIAGNOSIS — Z1211 Encounter for screening for malignant neoplasm of colon: Secondary | ICD-10-CM

## 2022-07-30 DIAGNOSIS — D122 Benign neoplasm of ascending colon: Secondary | ICD-10-CM | POA: Insufficient documentation

## 2022-07-30 DIAGNOSIS — Z87891 Personal history of nicotine dependence: Secondary | ICD-10-CM | POA: Insufficient documentation

## 2022-07-30 DIAGNOSIS — Z9049 Acquired absence of other specified parts of digestive tract: Secondary | ICD-10-CM | POA: Insufficient documentation

## 2022-07-30 DIAGNOSIS — D125 Benign neoplasm of sigmoid colon: Secondary | ICD-10-CM | POA: Diagnosis not present

## 2022-07-30 DIAGNOSIS — J449 Chronic obstructive pulmonary disease, unspecified: Secondary | ICD-10-CM | POA: Diagnosis not present

## 2022-07-30 DIAGNOSIS — D120 Benign neoplasm of cecum: Secondary | ICD-10-CM | POA: Diagnosis not present

## 2022-07-30 DIAGNOSIS — D126 Benign neoplasm of colon, unspecified: Secondary | ICD-10-CM

## 2022-07-30 HISTORY — PX: COLONOSCOPY WITH PROPOFOL: SHX5780

## 2022-07-30 SURGERY — COLONOSCOPY WITH PROPOFOL
Anesthesia: General

## 2022-07-30 MED ORDER — PROPOFOL 10 MG/ML IV BOLUS
INTRAVENOUS | Status: DC | PRN
  Administered 2022-07-30: 20 mg via INTRAVENOUS
  Administered 2022-07-30: 80 mg via INTRAVENOUS

## 2022-07-30 MED ORDER — PROPOFOL 500 MG/50ML IV EMUL
INTRAVENOUS | Status: DC | PRN
  Administered 2022-07-30: 125 ug/kg/min via INTRAVENOUS

## 2022-07-30 MED ORDER — SODIUM CHLORIDE 0.9 % IV SOLN: INTRAVENOUS | Status: DC

## 2022-07-30 NOTE — H&P (Signed)
Jonathon Bellows, MD [address] [place], [place], [place], [address] [address], [place], [place], [place], [address] Phone: [phone]  Fax: [phone]  Primary Care Physician:  Jearld Fenton, NP   Pre-Procedure History & Physical: HPI:  Musette Simental is a 72 y.o. female is here for an colonoscopy.   Past Medical History:  Diagnosis Date   Colon polyp     Past Surgical History:  Procedure Laterality Date   BREAST BIOPSY Left 04-26-14   ductal hyperplasia, 1 mm papilloma.   BREAST SURGERY Right 10/25/2009   milk duct excision in [place], atypical duct epithelial proliferation l   CESAREAN SECTION  1981   CHOLECYSTECTOMY  2006   COLONOSCOPY  2016   Dr Vira Agar   EXCISION / BIOPSY BREAST / NIPPLE / DUCT Right 2011   duct excision    Prior to Admission medications   Medication Sig Start Date End Date Taking? Authorizing Provider  amLODipine-olmesartan (AZOR) 10-40 MG tablet Take 1 tablet by mouth daily. 07/15/22   Jearld Fenton, NP  atorvastatin (LIPITOR) 10 MG tablet Take 1 tablet (10 mg total) by mouth daily. 06/25/22   Jearld Fenton, NP  Multiple Vitamin (MULTIVITAMIN) tablet Take 1 tablet by mouth daily.    [provider]    Allergies as of 07/03/2022   (No Known Allergies)    Family History  Problem Relation Age of Onset   Breast cancer Mother 59       Low grade DCIS   Dementia Mother    Dementia Father    Colon polyps Father    Breast cancer Sister 35       breast   Healthy Sister    Cancer Maternal Aunt        breast/great Aunt    Social History   Socioeconomic History   Marital status: Married    Spouse name: Not on file   Number of children: Not on file   Years of education: Not on file   Highest education level: Not on file  Occupational History   Not on file  Tobacco Use   Smoking status: Former    Packs/day: 1.00    Years: 30.00    Additional pack years: 0.00    Total pack years: 30.00    Types: Cigarettes     Quit date: 04/29/1999    Years since quitting: 23.2   Smokeless tobacco: Never  Vaping Use   Vaping Use: Never used  Substance and Sexual Activity   Alcohol use: No    Alcohol/week: 0.0 standard drinks of alcohol   Drug use: No   Sexual activity: Not on file  Other Topics Concern   Not on file  Social History Narrative   Not on file   Social Determinants of Health   Financial Resource Strain: Not on file  Food Insecurity: Not on file  Transportation Needs: Not on file  Physical Activity: Not on file  Stress: Not on file  Social Connections: Not on file  Intimate Partner Violence: Not on file    Review of Systems: See HPI, otherwise negative ROS  Physical Exam: There were no vitals taken for this visit. General:   Alert,  pleasant and cooperative in NAD Head:  Normocephalic and atraumatic. Neck:  Supple; no masses or thyromegaly. Lungs:  Clear throughout to auscultation, normal respiratory effort.    Heart:  +S1, +S2, Regular rate and rhythm, No edema. Abdomen:  Soft, nontender and nondistended. Normal  bowel sounds, without guarding, and without rebound.   Neurologic:  Alert and  oriented x4;  grossly normal neurologically.  Impression/Plan: Sharion Enslin is here for an colonoscopy to be performed for Screening colonoscopy average risk   Risks, benefits, limitations, and alternatives regarding  colonoscopy have been reviewed with the patient.  Questions have been answered.  All parties agreeable.   Jonathon Bellows, MD  07/30/2022, 10:12 AM

## 2022-07-30 NOTE — Anesthesia Preprocedure Evaluation (Signed)
Anesthesia Evaluation  Patient identified by MRN, date of birth, ID band Patient awake    Reviewed: Allergy & Precautions, NPO status , Patient's Chart, lab work & pertinent test results  History of Anesthesia Complications Negative for: history of anesthetic complications  Airway Mallampati: III  TM Distance: <3 FB Neck ROM: full    Dental  (+) Chipped, Missing, Poor Dentition, Implants   Pulmonary neg shortness of breath, COPD, former smoker   Pulmonary exam normal        Cardiovascular Exercise Tolerance: Good hypertension, (-) angina Normal cardiovascular exam     Neuro/Psych negative neurological ROS  negative psych ROS   GI/Hepatic negative GI ROS, Neg liver ROS,neg GERD  ,,  Endo/Other  negative endocrine ROS    Renal/GU negative Renal ROS  negative genitourinary   Musculoskeletal   Abdominal   Peds  Hematology negative hematology ROS (+)   Anesthesia Other Findings Past Medical History: No date: Colon polyp  Past Surgical History: 04-26-14: BREAST BIOPSY; Left     Comment:  ductal hyperplasia, 1 mm papilloma. 10/25/2009: BREAST SURGERY; Right     Comment:  milk duct excision in [place], atypical duct epithelial              proliferation l 1981: CESAREAN SECTION 2006: CHOLECYSTECTOMY 2016: COLONOSCOPY     Comment:  Dr Vira Agar 2011: EXCISION / BIOPSY BREAST / NIPPLE / DUCT; Right     Comment:  duct excision  BMI    Body Mass Index: 30.52 kg/m      Reproductive/Obstetrics negative OB ROS                             Anesthesia Physical Anesthesia Plan  ASA: 3  Anesthesia Plan: General   Post-op Pain Management:    Induction: Intravenous  PONV Risk Score and Plan: Propofol infusion and TIVA  Airway Management Planned: Natural Airway and Nasal Cannula  Additional Equipment:   Intra-op Plan:   Post-operative Plan:   Informed Consent: I have reviewed the  patients History and Physical, chart, labs and discussed the procedure including the risks, benefits and alternatives for the proposed anesthesia with the patient or authorized representative who has indicated his/her understanding and acceptance.     Dental Advisory Given  Plan Discussed with: Anesthesiologist, CRNA and Surgeon  Anesthesia Plan Comments: (Patient consented for risks of anesthesia including but not limited to:  - adverse reactions to medications - risk of airway placement if required - damage to eyes, teeth, lips or other oral mucosa - nerve damage due to positioning  - sore throat or hoarseness - Damage to heart, brain, nerves, lungs, other parts of body or loss of life  Patient voiced understanding.)       Anesthesia Quick Evaluation

## 2022-07-30 NOTE — Anesthesia Postprocedure Evaluation (Signed)
Anesthesia Post Note  Patient: Victoria Clarke  Procedure(s) Performed: COLONOSCOPY WITH PROPOFOL  Patient location during evaluation: Endoscopy Anesthesia Type: General Level of consciousness: awake and alert Pain management: pain level controlled Vital Signs Assessment: post-procedure vital signs reviewed and stable Respiratory status: spontaneous breathing, nonlabored ventilation, respiratory function stable and patient connected to nasal cannula oxygen Cardiovascular status: blood pressure returned to baseline and stable Postop Assessment: no apparent nausea or vomiting Anesthetic complications: no   No notable events documented.   Last Vitals:  Vitals:   07/30/22 1211 07/30/22 1221  BP: 127/81 125/71  Pulse: 79 73  Resp: 17 19  Temp: (!) 36.1 C   SpO2: 100% 100%    Last Pain:  Vitals:   07/30/22 1221  TempSrc:   PainSc: 0-No pain                 Precious Haws Jerrian Mells

## 2022-07-30 NOTE — Transfer of Care (Signed)
Immediate Anesthesia Transfer of Care Note  Patient: Victoria Clarke  Procedure(s) Performed: COLONOSCOPY WITH PROPOFOL  Patient Location: PACU  Anesthesia Type:General  Level of Consciousness: awake, alert , oriented, and patient cooperative  Airway & Oxygen Therapy: Patient Spontanous Breathing  Post-op Assessment: Report given to RN and Post -op Vital signs reviewed and stable  Post vital signs: Reviewed and stable  Last Vitals:  Vitals Value Taken Time  BP 127/81 07/30/22 1211  Temp    Pulse 81 07/30/22 1212  Resp 13 07/30/22 1212  SpO2 100 % 07/30/22 1212  Vitals shown include unvalidated device data.  Last Pain:  Vitals:   07/30/22 1021  TempSrc: Temporal  PainSc: 0-No pain         Complications: No notable events documented.

## 2022-07-30 NOTE — Op Note (Signed)
[facility]enterology Patient Name: Victoria Clarke Procedure Date: 07/30/2022 11:38 AM MRN: [id number] Account #: [id number] Date of Birth: [birth year] Admit Type: Outpatient Age: 72 Room: [facility] ENDO ROOM 3 Gender: Female Note Status: Finalized Instrument Name: Jasper Riling [id number] Procedure:             Colonoscopy Indications:           Screening for colorectal malignant neoplasm Providers:             Jonathon Bellows MD, MD Referring MD:          Jearld Fenton (Referring MD) Medicines:             Monitored Anesthesia Care Complications:         No immediate complications. Procedure:             Pre-Anesthesia Assessment:                        - Prior to the procedure, a History and Physical was                         performed, and patient medications, allergies and                         sensitivities were reviewed. The patient's tolerance                         of previous anesthesia was reviewed.                        - The risks and benefits of the procedure and the                         sedation options and risks were discussed with the                         patient. All questions were answered and informed                         consent was obtained.                        - ASA Grade Assessment: II - A patient with mild                         systemic disease.                        After obtaining informed consent, the colonoscope was                         passed under direct vision. Throughout the procedure,                         the patient's blood pressure, pulse, and oxygen                         saturations were monitored continuously. The                         Colonoscope was introduced  through the anus and                         advanced to the the cecum, identified by the                         appendiceal orifice. The patient tolerated the                         procedure well. The quality of the bowel preparation                          was excellent. The ileocecal valve, appendiceal                         orifice, and rectum were photographed. The colonoscopy                         was technically difficult and complex due to                         significant looping. Successful completion of the                         procedure was aided by withdrawing the scope and                         replacing with the pediatric colonoscope. Findings:      The perianal and digital rectal examinations were normal.      Two sessile polyps were found in the ascending colon and cecum. The       polyps were 4 to 5 mm in size. These polyps were removed with a cold       snare. Resection and retrieval were complete.      Two sessile polyps were found in the sigmoid colon. The polyps were 6 to       8 mm in size. These polyps were removed with a cold snare. Resection and       retrieval were complete. To prevent bleeding after the polypectomy, one       hemostatic clip was successfully placed. There was no bleeding at the       end of the procedure.      Multiple large-mouthed diverticula were found in the sigmoid colon.      The exam was otherwise without abnormality on direct and retroflexion       views. Impression:            - Two 4 to 5 mm polyps in the ascending colon and in                         the cecum, removed with a cold snare. Resected and                         retrieved.                        - Two 6 to 8 mm polyps in the sigmoid colon, removed  with a cold snare. Resected and retrieved. Clip was                         placed.                        - Diverticulosis in the sigmoid colon.                        - The examination was otherwise normal on direct and                         retroflexion views. Recommendation:        - Discharge patient to home (with escort).                        - Resume previous diet.                        - Continue present medications.                         - Await pathology results.                        - Repeat colonoscopy for surveillance based on                         pathology results. Procedure Code(s):     --- Professional ---                        [phone], Colonoscopy, flexible; with removal of                         tumor(s), polyp(s), or other lesion(s) by snare                         technique Diagnosis Code(s):     --- Professional ---                        Z12.11, Encounter for screening for malignant neoplasm                         of colon                        D12.2, Benign neoplasm of ascending colon                        D12.0, Benign neoplasm of cecum                        K57.30, Diverticulosis of large intestine without                         perforation or abscess without bleeding                        D12.5, Benign neoplasm of sigmoid colon CPT copyright 2022 American Medical Association. All rights reserved. The codes documented in this report are preliminary and upon coder review may  be revised to meet current compliance requirements. Jonathon Bellows, MD Jonathon Bellows MD, MD 07/30/2022 12:11:18 PM This report has been signed electronically. Number of Addenda: 0 Note Initiated On: 07/30/2022 11:38 AM Scope Withdrawal Time: 0 hours 10 minutes 33 seconds  Total Procedure Duration: 0 hours 21 minutes 18 seconds  Estimated Blood Loss:  Estimated blood loss: none.      [facility]

## 2022-07-31 ENCOUNTER — Encounter: Payer: Self-pay | Admitting: Gastroenterology

## 2022-07-31 LAB — SURGICAL PATHOLOGY

## 2022-08-06 ENCOUNTER — Ambulatory Visit
Admission: RE | Admit: 2022-08-06 | Discharge: 2022-08-06 | Disposition: A | Discharge status: Home / Self Care | Payer: Medicare Other | Source: Ambulatory Visit | Attending: Internal Medicine | Admitting: Internal Medicine

## 2022-08-06 ENCOUNTER — Encounter: Payer: Self-pay | Admitting: Internal Medicine

## 2022-08-06 DIAGNOSIS — Z1231 Encounter for screening mammogram for malignant neoplasm of breast: Secondary | ICD-10-CM

## 2022-08-06 DIAGNOSIS — E2839 Other primary ovarian failure: Secondary | ICD-10-CM | POA: Insufficient documentation

## 2022-09-02 ENCOUNTER — Encounter: Payer: Self-pay | Admitting: Gastroenterology

## 2022-10-16 ENCOUNTER — Other Ambulatory Visit: Payer: Medicare Other

## 2022-10-16 DIAGNOSIS — E782 Mixed hyperlipidemia: Secondary | ICD-10-CM

## 2022-10-17 ENCOUNTER — Encounter: Payer: Self-pay | Admitting: Internal Medicine

## 2022-10-17 ENCOUNTER — Other Ambulatory Visit: Payer: Self-pay

## 2022-10-17 LAB — COMPLETE METABOLIC PANEL WITH GFR
AG Ratio: 1.3 (calc) (ref 1.0–2.5)
ALT: 15 U/L (ref 6–29)
AST: 18 U/L (ref 10–35)
Albumin: 4.1 g/dL (ref 3.6–5.1)
Alkaline phosphatase (APISO): 57 U/L (ref 37–153)
BUN: 15 mg/dL (ref 7–25)
CO2: 30 mmol/L (ref 20–32)
Calcium: 9.8 mg/dL (ref 8.6–10.4)
Chloride: 103 mmol/L (ref 98–110)
Creat: 0.88 mg/dL (ref 0.60–1.00)
Globulin: 3.1 g/dL (calc) (ref 1.9–3.7)
Glucose, Bld: 91 mg/dL (ref 65–99)
Potassium: 5 mmol/L (ref 3.5–5.3)
Sodium: 141 mmol/L (ref 135–146)
Total Bilirubin: 0.6 mg/dL (ref 0.2–1.2)
Total Protein: 7.2 g/dL (ref 6.1–8.1)
eGFR: 69 mL/min/{1.73_m2} (ref >=60)

## 2022-10-17 LAB — LIPID PANEL
Cholesterol: 130 mg/dL (ref <200)
HDL: 47 mg/dL — ABNORMAL LOW (ref >=50)
LDL Cholesterol (Calc): 58 mg/dL (calc)
Non-HDL Cholesterol (Calc): 83 mg/dL (calc) (ref <130)
Total CHOL/HDL Ratio: 2.8 (calc) (ref <5.0)
Triglycerides: 171 mg/dL — ABNORMAL HIGH (ref <150)

## 2022-10-17 MED ORDER — ATORVASTATIN CALCIUM 20 MG PO TABS: 20.0000 mg | ORAL_TABLET | Freq: Every day | ORAL | 1 refills | Status: DC

## 2022-10-22 ENCOUNTER — Encounter: Payer: Self-pay | Admitting: Internal Medicine

## 2022-12-04 ENCOUNTER — Ambulatory Visit (INDEPENDENT_AMBULATORY_CARE_PROVIDER_SITE_OTHER): Payer: Medicare Other

## 2022-12-04 DIAGNOSIS — Z0000 Encounter for general adult medical examination without abnormal findings: Secondary | ICD-10-CM

## 2022-12-04 NOTE — Progress Notes (Signed)
Subjective:   Victoria Clarke is a 72 y.o. female who presents for an Initial Medicare Annual Wellness Visit.  Visit Complete: Virtual  I connected with  Victoria Clarke on 12/04/22 by a audio enabled telemedicine application and verified that I am speaking with the correct person using two identifiers.  Patient Location: Home  Provider Location: Office/Clinic  I discussed the limitations of evaluation and management by telemedicine. The patient expressed understanding and agreed to proceed.  Vital Signs: Unable to obtain new vitals due to this being a telehealth visit.  Review of Systems     Cardiac Risk Factors include: advanced age (>65men, >71 women);dyslipidemia;hypertension;obesity (BMI >30kg/m2)     Objective:    There were no vitals filed for this visit. There is no height or weight on file to calculate BMI.     12/04/2022   10:00 AM 07/30/2022   10:24 AM  Advanced Directives  Does Patient Have a Medical Advance Directive? No No  Would patient like information on creating a medical advance directive? No - Patient declined     Current Medications (verified) Outpatient Encounter Medications as of 12/04/2022  Medication Sig   amLODipine-olmesartan (AZOR) 10-40 MG tablet Take 1 tablet by mouth daily.   atorvastatin (LIPITOR) 20 MG tablet Take 1 tablet (20 mg total) by mouth daily.   calcium citrate (CALCITRATE - DOSED IN MG ELEMENTAL CALCIUM) 950 (200 Ca) MG tablet Take 200 mg of elemental calcium by mouth daily.   Multiple Vitamin (MULTIVITAMIN) tablet Take 1 tablet by mouth daily. (Patient not taking: Reported on 12/04/2022)   No facility-administered encounter medications on file as of 12/04/2022.    Allergies (verified) Patient has no known allergies.   History: Past Medical History:  Diagnosis Date   Colon polyp    Past Surgical History:  Procedure Laterality Date   BREAST BIOPSY Left 04-26-14   ductal hyperplasia, 1 mm papilloma.   BREAST SURGERY Right  10/25/2009   milk duct excision in [place], atypical duct epithelial proliferation l   CESAREAN SECTION  1981   CHOLECYSTECTOMY  2006   COLONOSCOPY  2016   Dr Mechele Collin   COLONOSCOPY WITH PROPOFOL N/A 07/30/2022   Procedure: COLONOSCOPY WITH PROPOFOL;  Surgeon: Wyline Mood, MD;  Location: [facility] ENDOSCOPY;  Service: Gastroenterology;  Laterality: N/A;   EXCISION / BIOPSY BREAST / NIPPLE / DUCT Right 2011   duct excision   Family History  Problem Relation Age of Onset   Breast cancer Mother 32       Low grade DCIS   Dementia Mother    Dementia Father    Colon polyps Father    Breast cancer Sister 45       breast   Healthy Sister    Cancer Maternal Aunt        breast/great Aunt   Social History   Socioeconomic History   Marital status: Married    Spouse name: Not on file   Number of children: Not on file   Years of education: Not on file   Highest education level: Not on file  Occupational History   Not on file  Tobacco Use   Smoking status: Former    Current packs/day: 0.00    Average packs/day: 1 pack/day for 30.0 years (30.0 ttl pk-yrs)    Types: Cigarettes    Start date: 04/28/1969    Quit date: 04/29/1999    Years since quitting: 23.6   Smokeless tobacco: Never  Vaping Use   Vaping status: Never  Used  Substance and Sexual Activity   Alcohol use: No    Alcohol/week: 0.0 standard drinks of alcohol   Drug use: No   Sexual activity: Not on file  Other Topics Concern   Not on file  Social History Narrative   Not on file   Social Determinants of Health   Financial Resource Strain: Low Risk  (12/04/2022)   Overall Financial Resource Strain (CARDIA)    Difficulty of Paying Living Expenses: Not hard at all  Food Insecurity: No Food Insecurity (12/04/2022)   Hunger Vital Sign    Worried About Running Out of Food in the Last Year: Never true    Ran Out of Food in the Last Year: Never true  Transportation Needs: No Transportation Needs (12/04/2022)   PRAPARE - Therapist, art (Medical): No    Lack of Transportation (Non-Medical): No  Physical Activity: Insufficiently Active (12/04/2022)   Exercise Vital Sign    Days of Exercise per Week: 3 days    Minutes of Exercise per Session: 30 min  Stress: No Stress Concern Present (12/04/2022)   [employer] of Occupational Health - Occupational Stress Questionnaire    Feeling of Stress : Only a little  Social Connections: Moderately Isolated (12/04/2022)   Social Connection and Isolation Panel [NHANES]    Frequency of Communication with Friends and Family: More than three times a week    Frequency of Social Gatherings with Friends and Family: Three times a week    Attends Religious Services: Never    Active Member of Clubs or Organizations: No    Attends Banker Meetings: Never    Marital Status: Married    Tobacco Counseling Counseling given: Not Answered   Clinical Intake:  Pre-visit preparation completed: Yes  Pain : No/denies pain     Nutritional Risks: None Diabetes: No  How often do you need to have someone help you when you read instructions, pamphlets, or other written materials from your doctor or pharmacy?: 1 - Never  Interpreter Needed?: No  Information entered by :: Kennedy Bucker, LPN   Activities of Daily Living    12/04/2022   10:01 AM 11/27/2022    3:53 PM  In your present state of health, do you have any difficulty performing the following activities:  Hearing? 0 0  Vision? 0 0  Difficulty concentrating or making decisions? 0 0  Walking or climbing stairs? 0 0  Dressing or bathing? 0 0  Doing errands, shopping? 0 0  Preparing Food and eating ? N N  Using the Toilet? N N  In the past six months, have you accidently leaked urine? N N  Do you have problems with loss of bowel control? N N  Managing your Medications? N N  Managing your Finances? N N  Housekeeping or managing your Housekeeping? N N    Patient Care Team: Lorre Munroe, NP  as PCP - General (Internal Medicine) Baker Pierini, NP as Nurse Practitioner (Family Medicine) Lemar Livings Merrily Pew, MD (General Surgery)  Indicate any recent Medical Services you may have received from other than [facility] providers in the past year (date may be approximate).     Assessment:   This is a routine wellness examination for [place].  Hearing/Vision screen Hearing Screening - Comments:: No aids Vision Screening - Comments:: Wears glasses- [facility]  Dietary issues and exercise activities discussed:     Goals Addressed  This Visit's Progress    DIET - EAT MORE FRUITS AND VEGETABLES         Depression Screen    12/04/2022    9:59 AM 06/24/2022   10:22 AM  PHQ 2/9 Scores  PHQ - 2 Score 0 0  PHQ- 9 Score 0     Fall Risk    12/04/2022   10:01 AM 11/27/2022    3:53 PM 06/24/2022   10:22 AM  Fall Risk   Falls in the past year? 0 0 0  Number falls in past yr: 0 0   Injury with Fall? 0 0 0  Risk for fall due to : No Fall Risks  No Fall Risks  Follow up Falls prevention discussed;Falls evaluation completed      MEDICARE RISK AT HOME:  Medicare Risk at Home - 12/04/22 1001     Any stairs in or around the home? Yes    If so, are there any without handrails? No    Home free of loose throw rugs in walkways, pet beds, electrical cords, etc? Yes    Adequate lighting in your home to reduce risk of falls? Yes    Life alert? No    Use of a cane, walker or w/c? No    Grab bars in the bathroom? Yes    Shower chair or bench in shower? Yes    Elevated toilet seat or a handicapped toilet? Yes             TIMED UP AND GO:  Was the test performed? No    Cognitive Function:        12/04/2022   10:08 AM  6CIT Screen  What Year? 0 points  What month? 0 points  What time? 0 points  Count back from 20 0 points  Months in reverse 0 points  Repeat phrase 0 points  Total Score 0 points    Immunizations Immunization History  Administered Date(s)  Administered   PFIZER(Purple Top)SARS-COV-2 Vaccination 06/18/2019, 07/12/2019   Zoster Recombinant(Shingrix) 07/10/2022    TDAP status: Due, Education has been provided regarding the importance of this vaccine. Advised may receive this vaccine at local pharmacy or Health Dept. Aware to provide a copy of the vaccination record if obtained from local pharmacy or Health Dept. Verbalized acceptance and understanding.  Flu Vaccine status: Declined, Education has been provided regarding the importance of this vaccine but patient still declined. Advised may receive this vaccine at local pharmacy or Health Dept. Aware to provide a copy of the vaccination record if obtained from local pharmacy or Health Dept. Verbalized acceptance and understanding.  Pneumococcal vaccine status: Declined,  Education has been provided regarding the importance of this vaccine but patient still declined. Advised may receive this vaccine at local pharmacy or Health Dept. Aware to provide a copy of the vaccination record if obtained from local pharmacy or Health Dept. Verbalized acceptance and understanding.   Covid-19 vaccine status: Completed vaccines  Qualifies for Shingles Vaccine? Yes   Zostavax completed No   Shingrix Completed?: Yes  Screening Tests Health Maintenance  Topic Date Due   DTaP/Tdap/Td (1 - Tdap) Never done   Pneumonia Vaccine 80+ Years old (1 of 1 - PCV) Never done   COVID-19 Vaccine (3 - 2023-24 season) 12/27/2021   Zoster Vaccines- Shingrix (2 of 2) 09/04/2022   INFLUENZA VACCINE  11/27/2022   MAMMOGRAM  08/06/2023   Medicare Annual Wellness (AWV)  12/04/2023   Colonoscopy  07/29/2032   DEXA  SCAN  Completed   Hepatitis C Screening  Completed   HPV VACCINES  Aged Out    Health Maintenance  Health Maintenance Due  Topic Date Due   DTaP/Tdap/Td (1 - Tdap) Never done   Pneumonia Vaccine 70+ Years old (1 of 1 - PCV) Never done   COVID-19 Vaccine (3 - 2023-24 season) 12/27/2021   Zoster  Vaccines- Shingrix (2 of 2) 09/04/2022   INFLUENZA VACCINE  11/27/2022    Colorectal cancer screening: Type of screening: Colonoscopy. Completed 07/30/22. Repeat every 2 years- at age 22  Mammogram status: Completed 08/06/22. Repeat every year  Bone Density status: Completed 08/06/22. Results reflect: Bone density results: OSTEOPENIA. Repeat every 2 years.- per patient  Lung Cancer Screening: (Low Dose CT Chest recommended if Age 74-80 years, 20 pack-year currently smoking OR have quit w/in 15years.) does not qualify.    Additional Screening:  Hepatitis C Screening: does qualify; Completed 06/24/22  Vision Screening: Recommended annual ophthalmology exams for early detection of glaucoma and other disorders of the eye. Is the patient up to date with their annual eye exam?  Yes  Who is the provider or what is the name of the office in which the patient attends annual eye exams? [facility] If pt is not established with a provider, would they like to be referred to a provider to establish care? No .   Dental Screening: Recommended annual dental exams for proper oral hygiene    Community Resource Referral / Chronic Care Management: CRR required this visit?  No   CCM required this visit?  No     Plan:     I have personally reviewed and noted the following in the patient's chart:   Medical and social history Use of alcohol, tobacco or illicit drugs  Current medications and supplements including opioid prescriptions. Patient is not currently taking opioid prescriptions. Functional ability and status Nutritional status Physical activity Advanced directives List of other physicians Hospitalizations, surgeries, and ER visits in previous 12 months Vitals Screenings to include cognitive, depression, and falls Referrals and appointments  In addition, I have reviewed and discussed with patient certain preventive protocols, quality metrics, and best practice recommendations. A  written personalized care plan for preventive services as well as general preventive health recommendations were provided to patient.     Hal Hope, LPN   08/02/8293   After Visit Summary: (MyChart) Due to this being a telephonic visit, the after visit summary with patients personalized plan was offered to patient via MyChart   Nurse Notes: none

## 2022-12-04 NOTE — Patient Instructions (Addendum)
Victoria Clarke , Thank you for taking time to come for your Medicare Wellness Visit. I appreciate your ongoing commitment to your health goals. Please review the following plan we discussed and let me know if I can assist you in the future.   Referrals/Orders/Follow-Ups/Clinician Recommendations: none  This is a list of the screening recommended for you and due dates:  Health Maintenance  Topic Date Due   DTaP/Tdap/Td vaccine (1 - Tdap) Never done   Pneumonia Vaccine (1 of 1 - PCV) Never done   COVID-19 Vaccine (3 - 2023-24 season) 12/27/2021   Zoster (Shingles) Vaccine (2 of 2) 09/04/2022   Flu Shot  11/27/2022   Mammogram  08/06/2023   Medicare Annual Wellness Visit  12/04/2023   Colon Cancer Screening  07/29/2032   DEXA scan (bone density measurement)  Completed   Hepatitis C Screening  Completed   HPV Vaccine  Aged Out    Advanced directives: (ACP Link)Information on Advanced Care Planning can be found at [facility] Advance Health Care Directives Advance Health Care Directives ([email or web address]   Next Medicare Annual Wellness Visit scheduled for next year: Yes   12/10/23 @ 9:15 am by phone  Preventive Care 65 Years and Older, Female Preventive care refers to lifestyle choices and visits with your health care provider that can promote health and wellness. What does preventive care include? A yearly physical exam. This is also called an annual well check. Dental exams once or twice a year. Routine eye exams. Ask your health care provider how often you should have your eyes checked. Personal lifestyle choices, including: Daily care of your teeth and gums. Regular physical activity. Eating a healthy diet. Avoiding tobacco and drug use. Limiting alcohol use. Practicing safe sex. Taking low-dose aspirin every day. Taking vitamin and mineral supplements as recommended by your health care provider. What happens during an annual well check? The services and  screenings done by your health care provider during your annual well check will depend on your age, overall health, lifestyle risk factors, and family history of disease. Counseling  Your health care provider may ask you questions about your: Alcohol use. Tobacco use. Drug use. Emotional well-being. Home and relationship well-being. Sexual activity. Eating habits. History of falls. Memory and ability to understand (cognition). Work and work Astronomer. Reproductive health. Screening  You may have the following tests or measurements: Height, weight, and BMI. Blood pressure. Lipid and cholesterol levels. These may be checked every 5 years, or more frequently if you are over 8 years old. Skin check. Lung cancer screening. You may have this screening every year starting at age 50 if you have a 30-pack-year history of smoking and currently smoke or have quit within the past 15 years. Fecal occult blood test (FOBT) of the stool. You may have this test every year starting at age 44. Flexible sigmoidoscopy or colonoscopy. You may have a sigmoidoscopy every 5 years or a colonoscopy every 10 years starting at age 11. Hepatitis C blood test. Hepatitis B blood test. Sexually transmitted disease (STD) testing. Diabetes screening. This is done by checking your blood sugar (glucose) after you have not eaten for a while (fasting). You may have this done every 1-3 years. Bone density scan. This is done to screen for osteoporosis. You may have this done starting at age 51. Mammogram. This may be done every 1-2 years. Talk to your health care provider about how often you should have regular mammograms. Talk with your health care provider  about your test results, treatment options, and if necessary, the need for more tests. Vaccines  Your health care provider may recommend certain vaccines, such as: Influenza vaccine. This is recommended every year. Tetanus, diphtheria, and acellular pertussis (Tdap,  Td) vaccine. You may need a Td booster every 10 years. Zoster vaccine. You may need this after age 15. Pneumococcal 13-valent conjugate (PCV13) vaccine. One dose is recommended after age 54. Pneumococcal polysaccharide (PPSV23) vaccine. One dose is recommended after age 36. Talk to your health care provider about which screenings and vaccines you need and how often you need them. This information is not intended to replace advice given to you by your health care provider. Make sure you discuss any questions you have with your health care provider. Document Released: 05/11/2015 Document Revised: 01/02/2016 Document Reviewed: 02/13/2015 Elsevier Interactive Patient Education  2017 [employer].  Fall Prevention in the Home Falls can cause injuries. They can happen to people of all ages. There are many things you can do to make your home safe and to help prevent falls. What can I do on the outside of my home? Regularly fix the edges of walkways and driveways and fix any cracks. Remove anything that might make you trip as you walk through a door, such as a raised step or threshold. Trim any bushes or trees on the path to your home. Use bright outdoor lighting. Clear any walking paths of anything that might make someone trip, such as rocks or tools. Regularly check to see if handrails are loose or broken. Make sure that both sides of any steps have handrails. Any raised decks and porches should have guardrails on the edges. Have any leaves, snow, or ice cleared regularly. Use sand or salt on walking paths during winter. Clean up any spills in your garage right away. This includes oil or grease spills. What can I do in the bathroom? Use night lights. Install grab bars by the toilet and in the tub and shower. Do not use towel bars as grab bars. Use non-skid mats or decals in the tub or shower. If you need to sit down in the shower, use a plastic, non-slip stool. Keep the floor dry. Clean up any  water that spills on the floor as soon as it happens. Remove soap buildup in the tub or shower regularly. Attach bath mats securely with double-sided non-slip rug tape. Do not have throw rugs and other things on the floor that can make you trip. What can I do in the bedroom? Use night lights. Make sure that you have a light by your bed that is easy to reach. Do not use any sheets or blankets that are too big for your bed. They should not hang down onto the floor. Have a firm chair that has side arms. You can use this for support while you get dressed. Do not have throw rugs and other things on the floor that can make you trip. What can I do in the kitchen? Clean up any spills right away. Avoid walking on wet floors. Keep items that you use a lot in easy-to-reach places. If you need to reach something above you, use a strong step stool that has a grab bar. Keep electrical cords out of the way. Do not use floor polish or wax that makes floors slippery. If you must use wax, use non-skid floor wax. Do not have throw rugs and other things on the floor that can make you trip. What can I do  with my stairs? Do not leave any items on the stairs. Make sure that there are handrails on both sides of the stairs and use them. Fix handrails that are broken or loose. Make sure that handrails are as long as the stairways. Check any carpeting to make sure that it is firmly attached to the stairs. Fix any carpet that is loose or worn. Avoid having throw rugs at the top or bottom of the stairs. If you do have throw rugs, attach them to the floor with carpet tape. Make sure that you have a light switch at the top of the stairs and the bottom of the stairs. If you do not have them, ask someone to add them for you. What else can I do to help prevent falls? Wear shoes that: Do not have high heels. Have rubber bottoms. Are comfortable and fit you well. Are closed at the toe. Do not wear sandals. If you use a  stepladder: Make sure that it is fully opened. Do not climb a closed stepladder. Make sure that both sides of the stepladder are locked into place. Ask someone to hold it for you, if possible. Clearly mark and make sure that you can see: Any grab bars or handrails. First and last steps. Where the edge of each step is. Use tools that help you move around (mobility aids) if they are needed. These include: Canes. Walkers. Scooters. Crutches. Turn on the lights when you go into a dark area. Replace any light bulbs as soon as they burn out. Set up your furniture so you have a clear path. Avoid moving your furniture around. If any of your floors are uneven, fix them. If there are any pets around you, be aware of where they are. Review your medicines with your doctor. Some medicines can make you feel dizzy. This can increase your chance of falling. Ask your doctor what other things that you can do to help prevent falls. This information is not intended to replace advice given to you by your health care provider. Make sure you discuss any questions you have with your health care provider. Document Released: 02/08/2009 Document Revised: 09/20/2015 Document Reviewed: 05/19/2014 Elsevier Interactive Patient Education  2017 [employer].

## 2022-12-18 ENCOUNTER — Encounter: Payer: Self-pay | Admitting: Internal Medicine

## 2022-12-18 ENCOUNTER — Ambulatory Visit (INDEPENDENT_AMBULATORY_CARE_PROVIDER_SITE_OTHER): Payer: Medicare Other | Admitting: Internal Medicine

## 2022-12-18 VITALS — BP 116/66 | HR 80 | Temp 96.7°F | Ht 63.0 in | Wt 187.0 lb

## 2022-12-18 DIAGNOSIS — M810 Age-related osteoporosis without current pathological fracture: Secondary | ICD-10-CM

## 2022-12-18 DIAGNOSIS — I10 Essential (primary) hypertension: Secondary | ICD-10-CM

## 2022-12-18 DIAGNOSIS — E782 Mixed hyperlipidemia: Secondary | ICD-10-CM

## 2022-12-18 DIAGNOSIS — E6609 Other obesity due to excess calories: Secondary | ICD-10-CM

## 2022-12-18 DIAGNOSIS — E66811 Obesity, class 1: Secondary | ICD-10-CM

## 2022-12-18 DIAGNOSIS — Z6833 Body mass index (BMI) 33.0-33.9, adult: Secondary | ICD-10-CM

## 2022-12-18 NOTE — Progress Notes (Signed)
Subjective:    Patient ID: Victoria Clarke, female    DOB: [birth year], 72 y.o.   MRN: [id number]  HPI  Patient presents to clinic today for follow-up of chronic conditions.  HTN: Her BP today is 116/66.  She is taking amlodipine-olmesartan as prescribed.  There is no ECG on file.  HLD: Her last LDL was 58, triglyceri 171, 09/2022.  She denies myalgias on atorvastatin.  She tries to consume low-fat diet.  Osteoporosis: She is taking calcium and vitamin D OTC.  She has never been treated for osteoporosis in the past and refused referral to endocrinology for Prolia injections.  She tries to get some weightbearing exercise in.  Review of Systems  Past Medical History:  Diagnosis Date   Colon polyp     Current Outpatient Medications  Medication Sig Dispense Refill   amLODipine-olmesartan (AZOR) 10-40 MG tablet Take 1 tablet by mouth daily. 90 tablet 1   atorvastatin (LIPITOR) 20 MG tablet Take 1 tablet (20 mg total) by mouth daily. 90 tablet 1   calcium citrate (CALCITRATE - DOSED IN MG ELEMENTAL CALCIUM) 950 (200 Ca) MG tablet Take 200 mg of elemental calcium by mouth daily.     Multiple Vitamin (MULTIVITAMIN) tablet Take 1 tablet by mouth daily. (Patient not taking: Reported on 12/04/2022)     No current facility-administered medications for this visit.    No Known Allergies  Family History  Problem Relation Age of Onset   Breast cancer Mother 27       Low grade DCIS   Dementia Mother    Dementia Father    Colon polyps Father    Breast cancer Sister 13       breast   Healthy Sister    Cancer Maternal Aunt        breast/great Aunt    Social History   Socioeconomic History   Marital status: Married    Spouse name: Not on file   Number of children: Not on file   Years of education: Not on file   Highest education level: Not on file  Occupational History   Not on file  Tobacco Use   Smoking status: Former    Current packs/day: 0.00    Average packs/day: 1 pack/day  for 30.0 years (30.0 ttl pk-yrs)    Types: Cigarettes    Start date: 04/28/1969    Quit date: 04/29/1999    Years since quitting: 23.6   Smokeless tobacco: Never  Vaping Use   Vaping status: Never Used  Substance and Sexual Activity   Alcohol use: No    Alcohol/week: 0.0 standard drinks of alcohol   Drug use: No   Sexual activity: Not on file  Other Topics Concern   Not on file  Social History Narrative   Not on file   Social Determinants of Health   Financial Resource Strain: Low Risk  (12/04/2022)   Overall Financial Resource Strain (CARDIA)    Difficulty of Paying Living Expenses: Not hard at all  Food Insecurity: No Food Insecurity (12/04/2022)   Hunger Vital Sign    Worried About Running Out of Food in the Last Year: Never true    Ran Out of Food in the Last Year: Never true  Transportation Needs: No Transportation Needs (12/04/2022)   PRAPARE - Administrator, Civil Service (Medical): No    Lack of Transportation (Non-Medical): No  Physical Activity: Insufficiently Active (12/04/2022)   Exercise Vital Sign    Days of Exercise  per Week: 3 days    Minutes of Exercise per Session: 30 min  Stress: No Stress Concern Present (12/04/2022)   [employer] of Occupational Health - Occupational Stress Questionnaire    Feeling of Stress : Only a little  Social Connections: Moderately Isolated (12/04/2022)   Social Connection and Isolation Panel [NHANES]    Frequency of Communication with Friends and Family: More than three times a week    Frequency of Social Gatherings with Friends and Family: Three times a week    Attends Religious Services: Never    Active Member of Clubs or Organizations: No    Attends Banker Meetings: Never    Marital Status: Married  Catering manager Violence: Not At Risk (12/04/2022)   Humiliation, Afraid, Rape, and Kick questionnaire    Fear of Current or Ex-Partner: No    Emotionally Abused: No    Physically Abused: No    Sexually  Abused: No     Constitutional: Denies fever, malaise, fatigue, headache or abrupt weight changes.  HEENT: Denies eye pain, eye redness, ear pain, ringing in the ears, wax buildup, runny nose, nasal congestion, bloody nose, or sore throat. Respiratory: Denies difficulty breathing, shortness of breath, cough or sputum production.   Cardiovascular: Patient reports intermittent swelling in legs.  Denies chest pain, chest tightness, palpitations or swelling in the hands.  Gastrointestinal: Denies abdominal pain, bloating, constipation, diarrhea or blood in the stool.  GU: Denies urgency, frequency, pain with urination, burning sensation, blood in urine, odor or discharge. Musculoskeletal: Denies decrease in range of motion, difficulty with gait, muscle pain or joint pain and swelling.  Skin: Denies redness, rashes, lesions or ulcercations.  Neurological: Denies dizziness, difficulty with memory, difficulty with speech or problems with balance and coordination.  Psych: Denies anxiety, depression, SI/HI.  No other specific complaints in a complete review of systems (except as listed in HPI above).     Objective:   Physical Exam   BP 116/66 (BP Location: Left Arm, Patient Position: Sitting, Cuff Size: Normal)   Pulse 80   Temp (!) 96.7 F (35.9 C) (Temporal)   Ht 5\' 3"  (1.6 m)   Wt 187 lb (84.8 kg)   BMI 33.13 kg/m   Wt Readings from Last 3 Encounters:  07/30/22 177 lb 12.8 oz (80.6 kg)  07/15/22 181 lb (82.1 kg)  07/08/22 171 lb (77.6 kg)    General: Appears her stated age, obese, in NAD. Skin: Warm, dry and intact.  HEENT: Head: normal shape and size; Eyes: sclera white, no icterus, conjunctiva pink, PERRLA and EOMs intact;   Cardiovascular: Normal rate and rhythm. S1,S2 noted.  No murmur, rubs or gallops noted. No JVD or BLE edema. No carotid bruits noted. Pulmonary/Chest: Normal effort and positive vesicular breath sounds. No respiratory distress. No wheezes, rales or ronchi  noted.  Musculoskeletal: No difficulty with gait.  Neurological: Alert and oriented. Coordination normal.  Psychiatric: Mood and affect normal. Behavior is normal. Judgment and thought content normal.     BMET    Component Value Date/Time   NA 141 10/16/2022 0809   K 5.0 10/16/2022 0809   CL 103 10/16/2022 0809   CO2 30 10/16/2022 0809   GLUCOSE 91 10/16/2022 0809   BUN 15 10/16/2022 0809   CREATININE 0.88 10/16/2022 0809   CALCIUM 9.8 10/16/2022 0809    Lipid Panel     Component Value Date/Time   CHOL 130 10/16/2022 0809   TRIG 171 (H) 10/16/2022 0809   HDL  47 (L) 10/16/2022 0809   CHOLHDL 2.8 10/16/2022 0809   LDLCALC 58 10/16/2022 0809    CBC    Component Value Date/Time   WBC 8.8 06/24/2022 1028   RBC 4.86 06/24/2022 1028   HGB 13.3 06/24/2022 1028   HCT 41.3 06/24/2022 1028   PLT 233 06/24/2022 1028   MCV 85.0 06/24/2022 1028   MCH 27.4 06/24/2022 1028   MCHC 32.2 06/24/2022 1028   RDW 14.2 06/24/2022 1028    Hgb A1C Lab Results  Component Value Date   HGBA1C 5.4 06/24/2022           Assessment & Plan:    RTC in 6 months, follow-up chronic conditions Nicki Reaper, NP

## 2022-12-18 NOTE — Assessment & Plan Note (Signed)
Encouraged her to continue vitamin D and calcium OTC She declines treatment for osteoporosis at this time Encouraged daily weightbearing exercise

## 2022-12-18 NOTE — Assessment & Plan Note (Signed)
Encourage diet and exercise for weight loss 

## 2022-12-18 NOTE — Patient Instructions (Signed)
Osteoporosis  Osteoporosis is when the bones get thin and weak. This can cause your bones to break (fracture) more easily. What are the causes? The exact cause of this condition is not known. What increases the risk? Having family members with this condition. Not eating enough healthy foods. Taking certain medicines. Being female. Being age 73 or older. Smoking or using other products that contain nicotine or tobacco, such as e-cigarettes or chewing tobacco. Not exercising. Being of European or Asian ancestry. Having a small body frame. What are the signs or symptoms? A broken bone might be the first sign, especially if the break results from a fall or injury that usually would not cause a bone to break. Other signs and symptoms include: Pain in the neck or low back. Being hunched over (stooped posture). Getting shorter. How is this treated? Eating more foods with more calcium and vitamin D in them. Doing exercises. Stopping tobacco use. Limiting how much alcohol you drink. Taking medicines to slow bone loss or help make the bones stronger. Taking supplements of calcium and vitamin D every day. Taking medicines to replace chemicals in the body (hormone replacement medicines). Monitoring your levels of calcium and vitamin D. The goal of treatment is to strengthen your bones and lower your risk for a bone break. Follow these instructions at home: Eating and drinking Eat plenty of calcium and vitamin D. These nutrients are good for your bones. Good sources of calcium and vitamin D include: Some fish, such as salmon and tuna. Foods that have calcium and vitamin D added to them (fortified foods), such as some breakfast cereals. Egg yolks. Cheese. Liver.  Activity Do exercises as told by your doctor. Ask your doctor what exercises are safe for you. You should do: Exercises that make your muscles work to hold your body weight up (weight-bearing exercises). These include tai chi,  yoga, and walking. Exercises to make your muscles stronger. One example is lifting weights. Lifestyle Do not drink alcohol if: Your doctor tells you not to drink. You are pregnant, may be pregnant, or are planning to become pregnant. If you drink alcohol: Limit how much you use to: 0-1 drink a day for women. 0-2 drinks a day for men. Know how much alcohol is in your drink. In the [place] one drink equals one 12 oz bottle of beer (355 mL), one 5 oz glass of wine (148 mL), or one 1 oz glass of hard liquor (44 mL). Do not smoke or use any products that contain nicotine or tobacco. If you need help quitting, ask your doctor. Preventing falls Use tools to help you move around (mobility aids) as needed. These include canes, walkers, scooters, and crutches. Keep rooms well-lit. Put away things on the floor that could make you trip. These include cords and rugs. Install safety rails on stairs. Install grab bars in bathrooms. Use rubber mats in slippery areas, like bathrooms. Wear shoes that: Fit you well. Support your feet. Have closed toes. Have rubber soles or low heels. Tell your doctor about all of the medicines you are taking. Some medicines can make you more likely to fall. General instructions Take over-the-counter and prescription medicines only as told by your doctor. Keep all follow-up visits. Contact a doctor if: You have not been tested (screened) for osteoporosis and you are: A woman who is age 65 or older. A man who is age 70 or older. Get help right away if: You fall. You get hurt. Summary Osteoporosis happens when your   bones get thin and weak. Weak bones can break (fracture) more easily. Eat plenty of calcium and vitamin D. These are good for your bones. Tell your doctor about all of the medicines that you take. This information is not intended to replace advice given to you by your health care provider. Make sure you discuss any questions you have with your health care  provider. Document Revised: 09/29/2019 Document Reviewed: 09/29/2019 Elsevier Patient Education  2024 Elsevier Inc.  

## 2022-12-18 NOTE — Assessment & Plan Note (Signed)
Lipid profile reviewed Encouraged her to consume a low fat diet Continue atorvastatin 

## 2022-12-18 NOTE — Assessment & Plan Note (Signed)
Controlled on amlodipine-olmesartan Reinforced DASH diet and exercise for weight loss Kidney function reviewed

## 2022-12-23 ENCOUNTER — Other Ambulatory Visit: Payer: Self-pay | Admitting: Internal Medicine

## 2022-12-24 NOTE — Telephone Encounter (Signed)
Requested Prescriptions  Pending Prescriptions Disp Refills   amLODipine-olmesartan (AZOR) 10-40 MG tablet [Pharmacy Med Name: AMLODIPINE/OLMESARTAN TABS 10/40MG ] 90 tablet 2    Sig: TAKE 1 TABLET DAILY     Cardiovascular: CCB + ARB Combos Passed - 12/23/2022  2:22 PM      Passed - K in normal range and within 180 days    Potassium  Date Value Ref Range Status  10/16/2022 5.0 3.5 - 5.3 mmol/L Final         Passed - Cr in normal range and within 180 days    Creat  Date Value Ref Range Status  10/16/2022 0.88 0.60 - 1.00 mg/dL Final         Passed - Na in normal range and within 180 days    Sodium  Date Value Ref Range Status  10/16/2022 141 135 - 146 mmol/L Final         Passed - Patient is not pregnant      Passed - Last BP in normal range    BP Readings from Last 1 Encounters:  12/18/22 116/66         Passed - Valid encounter within last 6 months    Recent Outpatient Visits           6 days ago Primary hypertension   [place] [place], Salvadore Oxford, NP   5 months ago Primary hypertension   [place] [place], Salvadore Oxford, NP   5 months ago [facility]   [facility] [place], Salvadore Oxford, NP   6 months ago Primary hypertension   [place] [facility] [place], Salvadore Oxford, NP       Future Appointments             In 6 months Baity, Salvadore Oxford, NP [place] [facility], [facility]

## 2023-04-20 ENCOUNTER — Telehealth (INDEPENDENT_AMBULATORY_CARE_PROVIDER_SITE_OTHER): Payer: Medicare Other | Admitting: Internal Medicine

## 2023-04-20 ENCOUNTER — Encounter: Payer: Self-pay | Admitting: Internal Medicine

## 2023-04-20 ENCOUNTER — Ambulatory Visit: Payer: Self-pay | Admitting: *Deleted

## 2023-04-20 DIAGNOSIS — N3000 Acute cystitis without hematuria: Secondary | ICD-10-CM

## 2023-04-20 DIAGNOSIS — N390 Urinary tract infection, site not specified: Secondary | ICD-10-CM | POA: Diagnosis not present

## 2023-04-20 MED ORDER — NITROFURANTOIN MONOHYD MACRO 100 MG PO CAPS: 100.0000 mg | ORAL_CAPSULE | Freq: Two times a day (BID) | ORAL | 0 refills | Status: DC

## 2023-04-20 NOTE — Telephone Encounter (Signed)
Summary: Frequent Urination, UTI   Chills, aches pain, fever, frequent urination, left side pain (Thursday, Friday),  currently experiencing just frequent urination, patient stated she took a UTI test and it was positive       Reason for Disposition  Side (flank) or lower back pain present  Answer Assessment - Initial Assessment Questions 1. SYMPTOM: "What's the main symptom you're concerned about?" (e.g., frequency, incontinence)     Frequent urination, fatigue 2. ONSET: "When did the  symptoms  start?"     Thursday night 12/19 3. PAIN: "Is there any pain?" If Yes, ask: "How bad is it?" (Scale: 1-10; mild, moderate, severe)     Side pain 4. CAUSE: "What do you think is causing the symptoms?"     OTC [facility] - test for UTI- positive 5. OTHER SYMPTOMS: "Do you have any other symptoms?" (e.g., blood in urine, fever, flank pain, pain with urination)     Side pain, chills  Protocols used: Urinary Symptoms-A-AH

## 2023-04-20 NOTE — Progress Notes (Signed)
Virtual Visit via Video Note  I connected with Victoria Clarke on 04/20/23 at 11:20 AM EST by a video enabled telemedicine application and verified that I am speaking with the correct person using two identifiers.  Location: Patient: Home Provider: Office  Persons participating in this video call: Nicki Reaper, NP and Victoria Clarke   I discussed the limitations of evaluation and management by telemedicine and the availability of in person appointments. The patient expressed understanding and agreed to proceed.  History of Present Illness:    Pt reports fever, chills, urinary urgency, frequency and left flank pain. This started 3 days ago. She denies dysuria, burning with urination, blood in her urine, vaginal discharge, odor or abnormal vaginal bleeding. She denies pelvic pain.  She has tried AZO and [place] OTC with minimal relief of symptoms.  She did a home urinalysis test which was positive per her report.  Past Medical History:  Diagnosis Date   Colon polyp     Current Outpatient Medications  Medication Sig Dispense Refill   amLODipine-olmesartan (AZOR) 10-40 MG tablet TAKE 1 TABLET DAILY 90 tablet 2   atorvastatin (LIPITOR) 20 MG tablet Take 1 tablet (20 mg total) by mouth daily. 90 tablet 1   calcium citrate (CALCITRATE - DOSED IN MG ELEMENTAL CALCIUM) 950 (200 Ca) MG tablet Take 200 mg of elemental calcium by mouth daily.     No current facility-administered medications for this visit.    No Known Allergies  Family History  Problem Relation Age of Onset   Breast cancer Mother 14       Low grade DCIS   Dementia Mother    Dementia Father    Colon polyps Father    Breast cancer Sister 42   Healthy Sister    Cancer Maternal Aunt        breast/great Aunt    Social History   Socioeconomic History   Marital status: Married    Spouse name: Not on file   Number of children: Not on file   Years of education: Not on file   Highest education level: Not on file   Occupational History   Not on file  Tobacco Use   Smoking status: Former    Current packs/day: 0.00    Average packs/day: 1 pack/day for 30.0 years (30.0 ttl pk-yrs)    Types: Cigarettes    Start date: 04/28/1969    Quit date: 04/29/1999    Years since quitting: 23.9   Smokeless tobacco: Never  Vaping Use   Vaping status: Never Used  Substance and Sexual Activity   Alcohol use: No    Alcohol/week: 0.0 standard drinks of alcohol   Drug use: No   Sexual activity: Not on file  Other Topics Concern   Not on file  Social History Narrative   Not on file   Social Drivers of Health   Financial Resource Strain: Low Risk  (12/04/2022)   Overall Financial Resource Strain (CARDIA)    Difficulty of Paying Living Expenses: Not hard at all  Food Insecurity: No Food Insecurity (12/04/2022)   Hunger Vital Sign    Worried About Running Out of Food in the Last Year: Never true    Ran Out of Food in the Last Year: Never true  Transportation Needs: No Transportation Needs (12/04/2022)   PRAPARE - Administrator, Civil Service (Medical): No    Lack of Transportation (Non-Medical): No  Physical Activity: Insufficiently Active (12/04/2022)   Exercise Vital Sign  Days of Exercise per Week: 3 days    Minutes of Exercise per Session: 30 min  Stress: No Stress Concern Present (12/04/2022)   [employer] of Occupational Health - Occupational Stress Questionnaire    Feeling of Stress : Only a little  Social Connections: Moderately Isolated (12/04/2022)   Social Connection and Isolation Panel [NHANES]    Frequency of Communication with Friends and Family: More than three times a week    Frequency of Social Gatherings with Friends and Family: Three times a week    Attends Religious Services: Never    Active Member of Clubs or Organizations: No    Attends Banker Meetings: Never    Marital Status: Married  Catering manager Violence: Not At Risk (12/04/2022)   Humiliation, Afraid,  Rape, and Kick questionnaire    Fear of Current or Ex-Partner: No    Emotionally Abused: No    Physically Abused: No    Sexually Abused: No     Constitutional: Denies fever, malaise, fatigue, headache or abrupt weight changes.  Respiratory: Denies difficulty breathing, shortness of breath, cough or sputum production.   Cardiovascular: Denies chest pain, chest tightness, palpitations or swelling in the hands or feet.  Gastrointestinal: Denies abdominal pain, bloating, constipation, diarrhea or blood in the stool.  GU: Patient reports urgency, frequency and left flank plain.  Denies pain with urination, burning sensation, blood in urine, odor or discharge.   No other specific complaints in a complete review of systems (except as listed in HPI above).  Observations/Objective:  Wt Readings from Last 3 Encounters:  12/18/22 187 lb (84.8 kg)  07/30/22 177 lb 12.8 oz (80.6 kg)  07/15/22 181 lb (82.1 kg)    General: Appears her stated age, in NAD. Pulmonary/Chest: Normal effort. No respiratory distress.  Neurological: Alert and oriented.   BMET    Component Value Date/Time   NA 141 10/16/2022 0809   K 5.0 10/16/2022 0809   CL 103 10/16/2022 0809   CO2 30 10/16/2022 0809   GLUCOSE 91 10/16/2022 0809   BUN 15 10/16/2022 0809   CREATININE 0.88 10/16/2022 0809   CALCIUM 9.8 10/16/2022 0809    Lipid Panel     Component Value Date/Time   CHOL 130 10/16/2022 0809   TRIG 171 (H) 10/16/2022 0809   HDL 47 (L) 10/16/2022 0809   CHOLHDL 2.8 10/16/2022 0809   LDLCALC 58 10/16/2022 0809    CBC    Component Value Date/Time   WBC 8.8 06/24/2022 1028   RBC 4.86 06/24/2022 1028   HGB 13.3 06/24/2022 1028   HCT 41.3 06/24/2022 1028   PLT 233 06/24/2022 1028   MCV 85.0 06/24/2022 1028   MCH 27.4 06/24/2022 1028   MCHC 32.2 06/24/2022 1028   RDW 14.2 06/24/2022 1028    Hgb A1C Lab Results  Component Value Date   HGBA1C 5.4 06/24/2022        Assessment and  Plan:  UTI:  She had a positive urinalysis at home with OTC test Unable to check urine analysis or culture at this time Rx for Macrobid 100 mg twice daily x 5 days Push fluids  RTC in 2 months for annual exam  Follow Up Instructions:    I discussed the assessment and treatment plan with the patient. The patient was provided an opportunity to ask questions and all were answered. The patient agreed with the plan and demonstrated an understanding of the instructions.   The patient was advised to call back or  seek an in-person evaluation if the symptoms worsen or if the condition fails to improve as anticipated.   Nicki Reaper, NP

## 2023-04-20 NOTE — Patient Instructions (Signed)
Urinary Tract Infection, Adult A urinary tract infection (UTI) is an infection of any part of the urinary tract. The urinary tract includes: The kidneys. The ureters. The bladder. The urethra. These organs make, store, and get rid of pee (urine) in the body. What are the causes? This infection is caused by germs (bacteria) in your genital area. These germs grow and cause swelling (inflammation) of your urinary tract. What increases the risk? The following factors may make you more likely to develop this condition: Using a small, thin tube (catheter) to drain pee. Not being able to control when you pee or poop (incontinence). Being female. If you are female, these things can increase the risk: Using these methods to prevent pregnancy: A medicine that kills sperm (spermicide). A device that blocks sperm (diaphragm). Having low levels of a female hormone (estrogen). Being pregnant. You are more likely to develop this condition if: You have genes that add to your risk. You are sexually active. You take antibiotic medicines. You have trouble peeing because of: A prostate that is bigger than normal, if you are female. A blockage in the part of your body that drains pee from the bladder. A kidney stone. A nerve condition that affects your bladder. Not getting enough to drink. Not peeing often enough. You have other conditions, such as: Diabetes. A weak disease-fighting system (immune system). Sickle cell disease. Gout. Injury of the spine. What are the signs or symptoms? Symptoms of this condition include: Needing to pee right away. Peeing small amounts often. Pain or burning when peeing. Blood in the pee. Pee that smells bad or not like normal. Trouble peeing. Pee that is cloudy. Fluid coming from the vagina, if you are female. Pain in the belly or lower back. Other symptoms include: Vomiting. Not feeling hungry. Feeling mixed up (confused). This may be the first symptom in  older adults. Being tired and grouchy (irritable). A fever. Watery poop (diarrhea). How is this treated? Taking antibiotic medicine. Taking other medicines. Drinking enough water. In some cases, you may need to see a specialist. Follow these instructions at home:  Medicines Take over-the-counter and prescription medicines only as told by your doctor. If you were prescribed an antibiotic medicine, take it as told by your doctor. Do not stop taking it even if you start to feel better. General instructions Make sure you: Pee until your bladder is empty. Do not hold pee for a long time. Empty your bladder after sex. Wipe from front to back after peeing or pooping if you are a female. Use each tissue one time when you wipe. Drink enough fluid to keep your pee pale yellow. Keep all follow-up visits. Contact a doctor if: You do not get better after 1-2 days. Your symptoms go away and then come back. Get help right away if: You have very bad back pain. You have very bad pain in your lower belly. You have a fever. You have chills. You feeling like you will vomit or you vomit. Summary A urinary tract infection (UTI) is an infection of any part of the urinary tract. This condition is caused by germs in your genital area. There are many risk factors for a UTI. Treatment includes antibiotic medicines. Drink enough fluid to keep your pee pale yellow. This information is not intended to replace advice given to you by your health care provider. Make sure you discuss any questions you have with your health care provider. Document Revised: 11/20/2019 Document Reviewed: 11/25/2019 Elsevier Patient Education    2024 Elsevier Inc.  

## 2023-04-20 NOTE — Telephone Encounter (Signed)
  Chief Complaint: urinary symptoms- side pain, frequency- + home test Symptoms: see above Frequency: started 12/19 Pertinent Negatives: Patient denies fever Disposition: [] ED /[] Urgent Care (no appt availability in office) / [x] Appointment(In office/virtual)/ []  [facility]/ [] Home Care/ [] Refused Recommended Disposition /[] [place] [facility]/ []  Follow-up with PCP Additional Notes: Appointment scheduled- patient to call back if she gets worse

## 2023-05-04 ENCOUNTER — Ambulatory Visit: Payer: Self-pay

## 2023-05-04 NOTE — Telephone Encounter (Signed)
    Chief Complaint: Nausea. Had vomiting which has stopped. Thinks it is UTI. Symptoms: Above Frequency: Friday Pertinent Negatives: Patient denies fever Disposition: [] ED /[] Urgent Care (no appt availability in office) / [x] Appointment(In office/virtual)/ []  [facility]/ [] Home Care/ [] Refused Recommended Disposition /[] [facility] [facility]/ []  Follow-up with PCP Additional Notes: Agrees with appointment.  Reason for Disposition  Nausea lasts > 1 week  Answer Assessment - Initial Assessment Questions 1. NAUSEA SEVERITY: How bad is the nausea? (e.g., mild, moderate, severe; dehydration, weight loss)   - MILD: loss of appetite without change in eating habits   - MODERATE: decreased oral intake without significant weight loss, dehydration, or malnutrition   - SEVERE: inadequate caloric or fluid intake, significant weight loss, symptoms of dehydration     Mild-moderate 2. ONSET: When did the nausea begin?     Friday 3. VOMITING: Any vomiting? If Yes, ask: How many times today?     Stopped 4. RECURRENT SYMPTOM: Have you had nausea before? If Yes, ask: When was the last time? What happened that time?     Yes 5. CAUSE: What do you think is causing the nausea?     UTI 6. PREGNANCY: Is there any chance you are pregnant? (e.g., unprotected intercourse, missed birth control pill, broken condom)     No  Protocols used: Nausea-A-AH

## 2023-05-04 NOTE — Telephone Encounter (Signed)
 Summary: Possible UTI: chills, vomiting, diarrhea, frequency, weakness, nausea.   Possible UTI: chills, vomiting, diarrhea, frequency, weakness, nausea. Denied abdominal pain. The last time she vomited was two days ago; she stated this started Friday.  Pt is requesting an appointment for tomorrow. No appointments available.      Left message to call back about symptoms.

## 2023-05-05 ENCOUNTER — Encounter: Payer: Self-pay | Admitting: Internal Medicine

## 2023-05-05 ENCOUNTER — Ambulatory Visit (INDEPENDENT_AMBULATORY_CARE_PROVIDER_SITE_OTHER): Payer: Medicare Other | Admitting: Internal Medicine

## 2023-05-05 VITALS — BP 118/68 | Ht 63.0 in | Wt 176.4 lb

## 2023-05-05 DIAGNOSIS — N390 Urinary tract infection, site not specified: Secondary | ICD-10-CM | POA: Diagnosis not present

## 2023-05-05 DIAGNOSIS — R350 Frequency of micturition: Secondary | ICD-10-CM | POA: Diagnosis not present

## 2023-05-05 LAB — POCT URINALYSIS DIPSTICK
Bilirubin, UA: NEGATIVE
Glucose, UA: NEGATIVE
Ketones, UA: NEGATIVE
Nitrite, UA: NEGATIVE
Odor: POSITIVE
Protein, UA: POSITIVE — AB
Spec Grav, UA: 1.02 (ref 1.010–1.025)
Urobilinogen, UA: 0.2 U/dL
pH, UA: 6 (ref 5.0–8.0)

## 2023-05-05 MED ORDER — CEPHALEXIN 500 MG PO CAPS: 500.0000 mg | ORAL_CAPSULE | Freq: Two times a day (BID) | ORAL | 0 refills | Status: DC

## 2023-05-05 NOTE — Progress Notes (Signed)
 Subjective:    Patient ID: Victoria Clarke, female    DOB: 1950/06/15, 73 y.o.   MRN: [id number]  HPI  Discussed the use of AI scribe software for clinical note transcription with the patient, who gave verbal consent to proceed.  The patient presents with a recurrent urinary tract infection (UTI). She completed a five-day course of antibiotics 12/23, after which she initially felt improved. However, her symptoms returned a few days later. The patient has been experiencing chills, vomiting, and pain in the left kidney area for approximately four to five days. She also reports nocturia, with urination every two hours.. The patient has been self-medicating with over-the-counter Azo to manage her symptoms. She denies any lower abdominal pain or burning sensation during urination. She denies urgency, dysuria, burning with urination or blood in her urine.   The patient has a history of UTIs, with the last episode occurring over a year ago. Prior to that, she had a couple of UTIs in a single year, but before that, she had a gap of approximately twenty years without any UTIs. The patient is unsure why the frequency of UTIs has increased recently.    Review of Systems   Past Medical History:  Diagnosis Date   Colon polyp     Current Outpatient Medications  Medication Sig Dispense Refill   amLODipine -olmesartan  (AZOR ) 10-40 MG tablet TAKE 1 TABLET DAILY 90 tablet 2   atorvastatin  (LIPITOR) 20 MG tablet Take 1 tablet (20 mg total) by mouth daily. 90 tablet 1   calcium  citrate (CALCITRATE - DOSED IN MG ELEMENTAL CALCIUM ) 950 (200 Ca) MG tablet Take 200 mg of elemental calcium  by mouth daily.     nitrofurantoin , macrocrystal-monohydrate, (MACROBID ) 100 MG capsule Take 1 capsule (100 mg total) by mouth 2 (two) times daily. 10 capsule 0   No current facility-administered medications for this visit.    No Known Allergies  Family History  Problem Relation Age of Onset   Breast cancer Mother 47        Low grade DCIS   Dementia Mother    Dementia Father    Colon polyps Father    Breast cancer Sister 29   Healthy Sister    Cancer Maternal Aunt        breast/great Aunt    Social History   Socioeconomic History   Marital status: Married    Spouse name: Not on file   Number of children: Not on file   Years of education: Not on file   Highest education level: Not on file  Occupational History   Not on file  Tobacco Use   Smoking status: Former    Current packs/day: 0.00    Average packs/day: 1 pack/day for 30.0 years (30.0 ttl pk-yrs)    Types: Cigarettes    Start date: 04/28/1969    Quit date: 04/29/1999    Years since quitting: 24.0   Smokeless tobacco: Never  Vaping Use   Vaping status: Never Used  Substance and Sexual Activity   Alcohol use: No    Alcohol/week: 0.0 standard drinks of alcohol   Drug use: No   Sexual activity: Not on file  Other Topics Concern   Not on file  Social History Narrative   Not on file   Social Drivers of Health   Financial Resource Strain: Low Risk  (12/04/2022)   Overall Financial Resource Strain (CARDIA)    Difficulty of Paying Living Expenses: Not hard at all  Food Insecurity: No Food Insecurity (  12/04/2022)   Hunger Vital Sign    Worried About Running Out of Food in the Last Year: Never true    Ran Out of Food in the Last Year: Never true  Transportation Needs: No Transportation Needs (12/04/2022)   PRAPARE - Administrator, Civil Service (Medical): No    Lack of Transportation (Non-Medical): No  Physical Activity: Insufficiently Active (12/04/2022)   Exercise Vital Sign    Days of Exercise per Week: 3 days    Minutes of Exercise per Session: 30 min  Stress: No Stress Concern Present (12/04/2022)   [employer] of Occupational Health - Occupational Stress Questionnaire    Feeling of Stress : Only a little  Social Connections: Moderately Isolated (12/04/2022)   Social Connection and Isolation Panel [NHANES]    Frequency  of Communication with Friends and Family: More than three times a week    Frequency of Social Gatherings with Friends and Family: Three times a week    Attends Religious Services: Never    Active Member of Clubs or Organizations: No    Attends Banker Meetings: Never    Marital Status: Married  Catering Manager Violence: Not At Risk (12/04/2022)   Humiliation, Afraid, Rape, and Kick questionnaire    Fear of Current or Ex-Partner: No    Emotionally Abused: No    Physically Abused: No    Sexually Abused: No     Constitutional: Pt reports fatigue, fever, chills. Denies headache or abrupt weight changes.  HEENT: Denies eye pain, eye redness, ear pain, ringing in the ears, wax buildup, runny nose, nasal congestion, bloody nose, or sore throat. Respiratory: Denies difficulty breathing, shortness of breath, cough or sputum production.   Cardiovascular: Denies chest pain, chest tightness, palpitations or swelling in the hands or feet.  Gastrointestinal: Patient reports nausea and vomiting, left flank pain.  Denies abdominal pain, bloating, constipation, diarrhea or blood in the stool.  GU: Pt reports frequency. Denies urgency, pain with urination, burning sensation, blood in urine, odor or discharge.   No other specific complaints in a complete review of systems (except as listed in HPI above).      Objective:   Physical Exam  BP 118/68 (BP Location: Left Arm, Patient Position: Sitting, Cuff Size: Normal)   Ht 5' 3 (1.6 m)   Wt 176 lb 6.4 oz (80 kg)   BMI 31.25 kg/m   Wt Readings from Last 3 Encounters:  12/18/22 187 lb (84.8 kg)  07/30/22 177 lb 12.8 oz (80.6 kg)  07/15/22 181 lb (82.1 kg)    General: Appears her stated age, obese, in NAD. Cardiovascular: Normal rate . Pulmonary/Chest: Normal effort and positive vesicular breath sounds. No respiratory distress. No wheezes, rales or ronchi noted.  Abdomen: Soft and nontender over the bladder. No CVA tenderness  noted. Neurological:   BMET    Component Value Date/Time   NA 141 10/16/2022 0809   K 5.0 10/16/2022 0809   CL 103 10/16/2022 0809   CO2 30 10/16/2022 0809   GLUCOSE 91 10/16/2022 0809   BUN 15 10/16/2022 0809   CREATININE 0.88 10/16/2022 0809   CALCIUM  9.8 10/16/2022 0809    Lipid Panel     Component Value Date/Time   CHOL 130 10/16/2022 0809   TRIG 171 (H) 10/16/2022 0809   HDL 47 (L) 10/16/2022 0809   CHOLHDL 2.8 10/16/2022 0809   LDLCALC 58 10/16/2022 0809    CBC    Component Value Date/Time   WBC 8.8  06/24/2022 1028   RBC 4.86 06/24/2022 1028   HGB 13.3 06/24/2022 1028   HCT 41.3 06/24/2022 1028   PLT 233 06/24/2022 1028   MCV 85.0 06/24/2022 1028   MCH 27.4 06/24/2022 1028   MCHC 32.2 06/24/2022 1028   RDW 14.2 06/24/2022 1028    Hgb A1C Lab Results  Component Value Date   HGBA1C 5.4 06/24/2022            Assessment & Plan:   Assessment and Plan    Urinary Tract Infection Recurrent symptoms after a 5-day course of an unspecified antibiotic. Left flank pain, fever, chills, vomiting, and increased urinary frequency. No dysuria or lower abdominal pain. Urinalysis positive for infection. -Start Keflex  500mg  BID for 7 days. -Send urine culture and adjust antibiotics based on results. -Encourage fluid intake and continue Azo as needed for symptom relief.       RTC in 1 month for your annual exam Angeline Laura, NP

## 2023-05-05 NOTE — Patient Instructions (Signed)
Urinary Tract Infection, Adult A urinary tract infection (UTI) is an infection of any part of the urinary tract. The urinary tract includes: The kidneys. The ureters. The bladder. The urethra. These organs make, store, and get rid of pee (urine) in the body. What are the causes? This infection is caused by germs (bacteria) in your genital area. These germs grow and cause swelling (inflammation) of your urinary tract. What increases the risk? The following factors may make you more likely to develop this condition: Using a small, thin tube (catheter) to drain pee. Not being able to control when you pee or poop (incontinence). Being female. If you are female, these things can increase the risk: Using these methods to prevent pregnancy: A medicine that kills sperm (spermicide). A device that blocks sperm (diaphragm). Having low levels of a female hormone (estrogen). Being pregnant. You are more likely to develop this condition if: You have genes that add to your risk. You are sexually active. You take antibiotic medicines. You have trouble peeing because of: A prostate that is bigger than normal, if you are female. A blockage in the part of your body that drains pee from the bladder. A kidney stone. A nerve condition that affects your bladder. Not getting enough to drink. Not peeing often enough. You have other conditions, such as: Diabetes. A weak disease-fighting system (immune system). Sickle cell disease. Gout. Injury of the spine. What are the signs or symptoms? Symptoms of this condition include: Needing to pee right away. Peeing small amounts often. Pain or burning when peeing. Blood in the pee. Pee that smells bad or not like normal. Trouble peeing. Pee that is cloudy. Fluid coming from the vagina, if you are female. Pain in the belly or lower back. Other symptoms include: Vomiting. Not feeling hungry. Feeling mixed up (confused). This may be the first symptom in  older adults. Being tired and grouchy (irritable). A fever. Watery poop (diarrhea). How is this treated? Taking antibiotic medicine. Taking other medicines. Drinking enough water. In some cases, you may need to see a specialist. Follow these instructions at home:  Medicines Take over-the-counter and prescription medicines only as told by your doctor. If you were prescribed an antibiotic medicine, take it as told by your doctor. Do not stop taking it even if you start to feel better. General instructions Make sure you: Pee until your bladder is empty. Do not hold pee for a long time. Empty your bladder after sex. Wipe from front to back after peeing or pooping if you are a female. Use each tissue one time when you wipe. Drink enough fluid to keep your pee pale yellow. Keep all follow-up visits. Contact a doctor if: You do not get better after 1-2 days. Your symptoms go away and then come back. Get help right away if: You have very bad back pain. You have very bad pain in your lower belly. You have a fever. You have chills. You feeling like you will vomit or you vomit. Summary A urinary tract infection (UTI) is an infection of any part of the urinary tract. This condition is caused by germs in your genital area. There are many risk factors for a UTI. Treatment includes antibiotic medicines. Drink enough fluid to keep your pee pale yellow. This information is not intended to replace advice given to you by your health care provider. Make sure you discuss any questions you have with your health care provider. Document Revised: 11/20/2019 Document Reviewed: 11/25/2019 Elsevier Patient Education    2024 Elsevier Inc.  

## 2023-05-07 ENCOUNTER — Encounter: Payer: Self-pay | Admitting: Internal Medicine

## 2023-05-07 LAB — URINE CULTURE
MICRO NUMBER:: 15927832
SPECIMEN QUALITY:: ADEQUATE

## 2023-05-14 ENCOUNTER — Encounter: Payer: Self-pay | Admitting: Internal Medicine

## 2023-05-14 ENCOUNTER — Ambulatory Visit (INDEPENDENT_AMBULATORY_CARE_PROVIDER_SITE_OTHER): Payer: Medicare Other | Admitting: Internal Medicine

## 2023-05-14 VITALS — BP 116/72 | Ht 63.0 in | Wt 179.0 lb

## 2023-05-14 DIAGNOSIS — N390 Urinary tract infection, site not specified: Secondary | ICD-10-CM

## 2023-05-14 DIAGNOSIS — R350 Frequency of micturition: Secondary | ICD-10-CM | POA: Diagnosis not present

## 2023-05-14 LAB — POCT URINALYSIS DIPSTICK
Bilirubin, UA: NEGATIVE
Glucose, UA: NEGATIVE
Ketones, UA: NEGATIVE
Nitrite, UA: NEGATIVE
Odor: POSITIVE
Protein, UA: POSITIVE — AB
Spec Grav, UA: 1.015 (ref 1.010–1.025)
Urobilinogen, UA: 0.2 U/dL
pH, UA: 6.5 (ref 5.0–8.0)

## 2023-05-14 MED ORDER — CIPROFLOXACIN HCL 500 MG PO TABS: 500.0000 mg | ORAL_TABLET | Freq: Two times a day (BID) | ORAL | 0 refills | Status: DC

## 2023-05-14 NOTE — Patient Instructions (Signed)
Urinary Tract Infection, Adult A urinary tract infection (UTI) is an infection of any part of the urinary tract. The urinary tract includes: The kidneys. The ureters. The bladder. The urethra. These organs make, store, and get rid of pee (urine) in the body. What are the causes? This infection is caused by germs (bacteria) in your genital area. These germs grow and cause swelling (inflammation) of your urinary tract. What increases the risk? The following factors may make you more likely to develop this condition: Using a small, thin tube (catheter) to drain pee. Not being able to control when you pee or poop (incontinence). Being female. If you are female, these things can increase the risk: Using these methods to prevent pregnancy: A medicine that kills sperm (spermicide). A device that blocks sperm (diaphragm). Having low levels of a female hormone (estrogen). Being pregnant. You are more likely to develop this condition if: You have genes that add to your risk. You are sexually active. You take antibiotic medicines. You have trouble peeing because of: A prostate that is bigger than normal, if you are female. A blockage in the part of your body that drains pee from the bladder. A kidney stone. A nerve condition that affects your bladder. Not getting enough to drink. Not peeing often enough. You have other conditions, such as: Diabetes. A weak disease-fighting system (immune system). Sickle cell disease. Gout. Injury of the spine. What are the signs or symptoms? Symptoms of this condition include: Needing to pee right away. Peeing small amounts often. Pain or burning when peeing. Blood in the pee. Pee that smells bad or not like normal. Trouble peeing. Pee that is cloudy. Fluid coming from the vagina, if you are female. Pain in the belly or lower back. Other symptoms include: Vomiting. Not feeling hungry. Feeling mixed up (confused). This may be the first symptom in  older adults. Being tired and grouchy (irritable). A fever. Watery poop (diarrhea). How is this treated? Taking antibiotic medicine. Taking other medicines. Drinking enough water. In some cases, you may need to see a specialist. Follow these instructions at home:  Medicines Take over-the-counter and prescription medicines only as told by your doctor. If you were prescribed an antibiotic medicine, take it as told by your doctor. Do not stop taking it even if you start to feel better. General instructions Make sure you: Pee until your bladder is empty. Do not hold pee for a long time. Empty your bladder after sex. Wipe from front to back after peeing or pooping if you are a female. Use each tissue one time when you wipe. Drink enough fluid to keep your pee pale yellow. Keep all follow-up visits. Contact a doctor if: You do not get better after 1-2 days. Your symptoms go away and then come back. Get help right away if: You have very bad back pain. You have very bad pain in your lower belly. You have a fever. You have chills. You feeling like you will vomit or you vomit. Summary A urinary tract infection (UTI) is an infection of any part of the urinary tract. This condition is caused by germs in your genital area. There are many risk factors for a UTI. Treatment includes antibiotic medicines. Drink enough fluid to keep your pee pale yellow. This information is not intended to replace advice given to you by your health care provider. Make sure you discuss any questions you have with your health care provider. Document Revised: 11/20/2019 Document Reviewed: 11/25/2019 Elsevier Patient Education    2024 Elsevier Inc.  

## 2023-05-14 NOTE — Progress Notes (Signed)
HPI  Discussed the use of AI scribe software for clinical note transcription with the patient, who gave verbal consent to proceed.  The patient, with a recent history of urinary tract infections, presents with persistent urinary symptoms despite completing two courses of antibiotics (Macrobid and Keflex). The patient reports that the symptoms initially improved but recurred, causing significant distress. The symptoms include urinary frequency, chills, nausea, diarrhea, weakness. The patient denies experiencing pain during urination but notes a change in the urine stream, which she interprets as an improvement. The patient also reports an episode of vomiting and diarrhea, which she describes as a 24-hour illness. The patient's urine continues to appear abnormal. The patient's E. Coli infection was found to be sensitive to both Macrobid and Keflex. The patient denies taking any over-the-counter medications for her symptoms.      Review of Systems  Past Medical History:  Diagnosis Date   Colon polyp     Family History  Problem Relation Age of Onset   Breast cancer Mother 72       Low grade DCIS   Dementia Mother    Dementia Father    Colon polyps Father    Breast cancer Sister 37   Healthy Sister    Cancer Maternal Aunt        breast/great Aunt    Social History   Socioeconomic History   Marital status: Married    Spouse name: Not on file   Number of children: Not on file   Years of education: Not on file   Highest education level: Not on file  Occupational History   Not on file  Tobacco Use   Smoking status: Former    Current packs/day: 0.00    Average packs/day: 1 pack/day for 30.0 years (30.0 ttl pk-yrs)    Types: Cigarettes    Start date: 04/28/1969    Quit date: 04/29/1999    Years since quitting: 24.0   Smokeless tobacco: Never  Vaping Use   Vaping status: Never Used  Substance and Sexual Activity   Alcohol use: No    Alcohol/week: 0.0 standard drinks of alcohol    Drug use: No   Sexual activity: Not on file  Other Topics Concern   Not on file  Social History Narrative   Not on file   Social Drivers of Health   Financial Resource Strain: Low Risk  (12/04/2022)   Overall Financial Resource Strain (CARDIA)    Difficulty of Paying Living Expenses: Not hard at all  Food Insecurity: No Food Insecurity (12/04/2022)   Hunger Vital Sign    Worried About Running Out of Food in the Last Year: Never true    Ran Out of Food in the Last Year: Never true  Transportation Needs: No Transportation Needs (12/04/2022)   PRAPARE - Administrator, Civil Service (Medical): No    Lack of Transportation (Non-Medical): No  Physical Activity: Insufficiently Active (12/04/2022)   Exercise Vital Sign    Days of Exercise per Week: 3 days    Minutes of Exercise per Session: 30 min  Stress: No Stress Concern Present (12/04/2022)   [employer] of Occupational Health - Occupational Stress Questionnaire    Feeling of Stress : Only a little  Social Connections: Moderately Isolated (12/04/2022)   Social Connection and Isolation Panel [NHANES]    Frequency of Communication with Friends and Family: More than three times a week    Frequency of Social Gatherings with Friends and Family: Three times a  week    Attends Religious Services: Never    Active Member of Clubs or Organizations: No    Attends Banker Meetings: Never    Marital Status: Married  Catering manager Violence: Not At Risk (12/04/2022)   Humiliation, Afraid, Rape, and Kick questionnaire    Fear of Current or Ex-Partner: No    Emotionally Abused: No    Physically Abused: No    Sexually Abused: No    No Known Allergies   Constitutional: Pt reports fatigue, chills. Denies fever, malaise, headache or abrupt weight changes.   GU: Pt reports frequency. Denies urgency, burning sensation, dysuria, blood in urine, odor or discharge. Skin: Denies redness, rashes, lesions or ulcercations.  GI: Pt  reports nausea, vomiting and diarrhea. Denies constipation, blood in her stool.  No other specific complaints in a complete review of systems (except as listed in HPI above).    Objective:   Physical Exam  BP 116/72 (BP Location: Left Arm, Patient Position: Sitting, Cuff Size: Normal)   Ht 5\' 3"  (1.6 m)   Wt 179 lb (81.2 kg)   BMI 31.71 kg/m   Wt Readings from Last 3 Encounters:  05/05/23 176 lb 6.4 oz (80 kg)  12/18/22 187 lb (84.8 kg)  07/30/22 177 lb 12.8 oz (80.6 kg)    General: Appears her stated age, well developed, well nourished in NAD. Cardiovascular: Normal rate and rhythm. S1,S2 noted.   Pulmonary/Chest: Normal effort and positive vesicular breath sounds. No respiratory distress. No wheezes, rales or ronchi noted.  Abdomen: Soft. Normal bowel sounds. No distention or masses noted.  Tender to palpation over the bladder area. No CVA tenderness.        Assessment & Plan:   Assessment and Plan    Recurrent Urinary Tract Infections Persistent symptoms despite appropriate antibiotic therapy (Macrobid and Keflex) based on culture sensitivity. Current symptoms include fatigue, chills, nausea, diarrhea, and increased urinary frequency. No dysuria. Urine appears abnormal. -Start Ciprofloxacin twice daily for 7 days. -Send urine for culture and sensitivity. -If symptoms persist after this course of antibiotics, referral to Urology for further evaluation.   RTC in 1 month for your annual exam Nicki Reaper, NP

## 2023-05-16 LAB — URINE CULTURE
MICRO NUMBER:: 15965249
SPECIMEN QUALITY:: ADEQUATE

## 2023-05-18 ENCOUNTER — Encounter: Payer: Self-pay | Admitting: Internal Medicine

## 2023-05-21 ENCOUNTER — Encounter: Payer: Self-pay | Admitting: Internal Medicine

## 2023-05-21 ENCOUNTER — Telehealth (INDEPENDENT_AMBULATORY_CARE_PROVIDER_SITE_OTHER): Payer: Managed Care, Other (non HMO) | Admitting: Internal Medicine

## 2023-05-21 DIAGNOSIS — N390 Urinary tract infection, site not specified: Secondary | ICD-10-CM | POA: Diagnosis not present

## 2023-05-21 LAB — POCT URINALYSIS DIPSTICK
Bilirubin, UA: NEGATIVE
Glucose, UA: NEGATIVE
Ketones, UA: NEGATIVE
Nitrite, UA: NEGATIVE
Odor: POSITIVE
Protein, UA: NEGATIVE
Spec Grav, UA: >=1.03 — AB (ref 1.010–1.025)
Urobilinogen, UA: 0.2 U/dL
pH, UA: 6 (ref 5.0–8.0)

## 2023-05-21 MED ORDER — SULFAMETHOXAZOLE-TRIMETHOPRIM 800-160 MG PO TABS: 1.0000 | ORAL_TABLET | Freq: Two times a day (BID) | ORAL | 0 refills | Status: AC

## 2023-05-21 NOTE — Telephone Encounter (Signed)
Spoke with patient. Appointment made for today at 1120

## 2023-05-21 NOTE — Progress Notes (Signed)
Virtual Visit via Video Note  I connected with Victoria Clarke on 05/21/23 at 11:20 AM EST by a video enabled telemedicine application and verified that I am speaking with the correct person using two identifiers.  Location: Patient: Home Provider: Office  Persons participating in this video call: Nicki Reaper, NP and Victoria Clarke   I discussed the limitations of evaluation and management by telemedicine and the availability of in person appointments. The patient expressed understanding and agreed to proceed.  History of Present Illness:  Discussed the use of AI scribe software for clinical note transcription with the patient, who gave verbal consent to proceed.   The patient initially presented with urinary symptoms on December 23rd, for which she was prescribed Macrobid for five days. Despite this treatment, the patient continued to experience urinary symptoms and was seen again in the office on January 7th. At this time, a urine culture grew E Coli, and the patient was prescribed Keflex 500 twice a day for five days.  However, the patient's urinary symptoms persisted, and a repeat urine culture on January 16th again grew E Coli. The patient was then prescribed ciprofloxacin 500 twice a day for seven days. Despite completing this course of antibiotics, the patient continues to experience frequent urination, particularly at night, with approximately hourly episodes. The patient denies any associated pain or burning.  The patient has also reported fatigue, which she attributes to disrupted sleep due to frequent urination. A home urine test strip indicated the presence of leukocytes. The patient has not previously seen a urologist for these ongoing urinary symptoms.      Past Medical History:  Diagnosis Date   Colon polyp     Current Outpatient Medications  Medication Sig Dispense Refill   amLODipine-olmesartan (AZOR) 10-40 MG tablet TAKE 1 TABLET DAILY 90 tablet 2   atorvastatin  (LIPITOR) 20 MG tablet Take 1 tablet (20 mg total) by mouth daily. 90 tablet 1   calcium citrate (CALCITRATE - DOSED IN MG ELEMENTAL CALCIUM) 950 (200 Ca) MG tablet Take 200 mg of elemental calcium by mouth daily.     ciprofloxacin (CIPRO) 500 MG tablet Take 1 tablet (500 mg total) by mouth 2 (two) times daily. One po bid x 7 days 14 tablet 0   No current facility-administered medications for this visit.    No Known Allergies  Family History  Problem Relation Age of Onset   Breast cancer Mother 14       Low grade DCIS   Dementia Mother    Dementia Father    Colon polyps Father    Breast cancer Sister 31   Healthy Sister    Cancer Maternal Aunt        breast/great Aunt    Social History   Socioeconomic History   Marital status: Married    Spouse name: Not on file   Number of children: Not on file   Years of education: Not on file   Highest education level: Not on file  Occupational History   Not on file  Tobacco Use   Smoking status: Former    Current packs/day: 0.00    Average packs/day: 1 pack/day for 30.0 years (30.0 ttl pk-yrs)    Types: Cigarettes    Start date: 04/28/1969    Quit date: 04/29/1999    Years since quitting: 24.0   Smokeless tobacco: Never  Vaping Use   Vaping status: Never Used  Substance and Sexual Activity   Alcohol use: No    Alcohol/week:  0.0 standard drinks of alcohol   Drug use: No   Sexual activity: Not on file  Other Topics Concern   Not on file  Social History Narrative   Not on file   Social Drivers of Health   Financial Resource Strain: Low Risk  (12/04/2022)   Overall Financial Resource Strain (CARDIA)    Difficulty of Paying Living Expenses: Not hard at all  Food Insecurity: No Food Insecurity (12/04/2022)   Hunger Vital Sign    Worried About Running Out of Food in the Last Year: Never true    Ran Out of Food in the Last Year: Never true  Transportation Needs: No Transportation Needs (12/04/2022)   PRAPARE - Doctor, general practice (Medical): No    Lack of Transportation (Non-Medical): No  Physical Activity: Insufficiently Active (12/04/2022)   Exercise Vital Sign    Days of Exercise per Week: 3 days    Minutes of Exercise per Session: 30 min  Stress: No Stress Concern Present (12/04/2022)   [employer] of Occupational Health - Occupational Stress Questionnaire    Feeling of Stress : Only a little  Social Connections: Moderately Isolated (12/04/2022)   Social Connection and Isolation Panel [NHANES]    Frequency of Communication with Friends and Family: More than three times a week    Frequency of Social Gatherings with Friends and Family: Three times a week    Attends Religious Services: Never    Active Member of Clubs or Organizations: No    Attends Banker Meetings: Never    Marital Status: Married  Catering manager Violence: Not At Risk (12/04/2022)   Humiliation, Afraid, Rape, and Kick questionnaire    Fear of Current or Ex-Partner: No    Emotionally Abused: No    Physically Abused: No    Sexually Abused: No     Constitutional: Pt reports fatigue. Denies fever, malaise, headache or abrupt weight changes.  Respiratory: Denies difficulty breathing, shortness of breath, cough or sputum production.   Cardiovascular: Denies chest pain, chest tightness, palpitations or swelling in the hands or feet.  Gastrointestinal: Denies abdominal pain, bloating, constipation, diarrhea or blood in the stool.  GU: Patient reports urgency, frequency.  Denies pain with urination, burning sensation, blood in urine, odor or discharge.   No other specific complaints in a complete review of systems (except as listed in HPI above).  Observations/Objective:  Wt Readings from Last 3 Encounters:  05/14/23 179 lb (81.2 kg)  05/05/23 176 lb 6.4 oz (80 kg)  12/18/22 187 lb (84.8 kg)    General: Appears her stated age, in NAD. Pulmonary/Chest: Normal effort. No respiratory distress.  Neurological:  Alert and oriented.   BMET    Component Value Date/Time   NA 141 10/16/2022 0809   K 5.0 10/16/2022 0809   CL 103 10/16/2022 0809   CO2 30 10/16/2022 0809   GLUCOSE 91 10/16/2022 0809   BUN 15 10/16/2022 0809   CREATININE 0.88 10/16/2022 0809   CALCIUM 9.8 10/16/2022 0809    Lipid Panel     Component Value Date/Time   CHOL 130 10/16/2022 0809   TRIG 171 (H) 10/16/2022 0809   HDL 47 (L) 10/16/2022 0809   CHOLHDL 2.8 10/16/2022 0809   LDLCALC 58 10/16/2022 0809    CBC    Component Value Date/Time   WBC 8.8 06/24/2022 1028   RBC 4.86 06/24/2022 1028   HGB 13.3 06/24/2022 1028   HCT 41.3 06/24/2022 1028   PLT  233 06/24/2022 1028   MCV 85.0 06/24/2022 1028   MCH 27.4 06/24/2022 1028   MCHC 32.2 06/24/2022 1028   RDW 14.2 06/24/2022 1028    Hgb A1C Lab Results  Component Value Date   HGBA1C 5.4 06/24/2022        Assessment and Plan: Assessment and Plan    Recurrent Urinary Tract Infections Persistent urinary symptoms despite multiple courses of antibiotics (Macrobid, Keflex, Ciprofloxacin). Urine cultures have consistently grown E. Coli. Patient reports frequent urination, particularly at night, but no pain or burning. Home urine test strip indicates presence of leukocytes. -Collect urine sample today for urinalysis and culture. -If urinalysis is abnormal, prescribe an antibiotic based on previous culture sensitivities. -RX for Septra DS 1 tab PO BID x 7 days -Refer to Urology for further evaluation given recurrent infections.      RTC in 1 months for annual exam  Follow Up Instructions:    I discussed the assessment and treatment plan with the patient. The patient was provided an opportunity to ask questions and all were answered. The patient agreed with the plan and demonstrated an understanding of the instructions.   The patient was advised to call back or seek an in-person evaluation if the symptoms worsen or if the condition fails to improve as  anticipated.   Nicki Reaper, NP

## 2023-05-21 NOTE — Telephone Encounter (Signed)
Can you see if she can come in at 1120 and we can repeat her urinalysis and get her referred to urology

## 2023-05-21 NOTE — Patient Instructions (Signed)

## 2023-05-22 LAB — URINE CULTURE
MICRO NUMBER:: 15991012
Result:: NO GROWTH
SPECIMEN QUALITY:: ADEQUATE

## 2023-05-25 ENCOUNTER — Encounter: Payer: Self-pay | Admitting: Internal Medicine

## 2023-06-15 ENCOUNTER — Encounter: Payer: Self-pay | Admitting: Internal Medicine

## 2023-06-15 MED ORDER — SULFAMETHOXAZOLE-TRIMETHOPRIM 400-80 MG PO TABS: 1.0000 | ORAL_TABLET | Freq: Two times a day (BID) | ORAL | 0 refills | Status: DC

## 2023-06-22 ENCOUNTER — Encounter: Payer: Self-pay | Admitting: Internal Medicine

## 2023-06-22 ENCOUNTER — Ambulatory Visit (INDEPENDENT_AMBULATORY_CARE_PROVIDER_SITE_OTHER): Payer: Self-pay | Admitting: Internal Medicine

## 2023-06-22 ENCOUNTER — Other Ambulatory Visit: Payer: Self-pay | Admitting: Internal Medicine

## 2023-06-22 VITALS — BP 112/62 | Ht 63.0 in | Wt 173.4 lb

## 2023-06-22 DIAGNOSIS — L608 Other nail disorders: Secondary | ICD-10-CM | POA: Diagnosis not present

## 2023-06-22 DIAGNOSIS — M810 Age-related osteoporosis without current pathological fracture: Secondary | ICD-10-CM

## 2023-06-22 DIAGNOSIS — I10 Essential (primary) hypertension: Secondary | ICD-10-CM

## 2023-06-22 DIAGNOSIS — E6609 Other obesity due to excess calories: Secondary | ICD-10-CM

## 2023-06-22 DIAGNOSIS — R739 Hyperglycemia, unspecified: Secondary | ICD-10-CM | POA: Diagnosis not present

## 2023-06-22 DIAGNOSIS — N390 Urinary tract infection, site not specified: Secondary | ICD-10-CM | POA: Insufficient documentation

## 2023-06-22 DIAGNOSIS — E66811 Obesity, class 1: Secondary | ICD-10-CM

## 2023-06-22 DIAGNOSIS — Z6830 Body mass index (BMI) 30.0-30.9, adult: Secondary | ICD-10-CM

## 2023-06-22 DIAGNOSIS — E782 Mixed hyperlipidemia: Secondary | ICD-10-CM

## 2023-06-22 NOTE — Assessment & Plan Note (Signed)
 Encouraged her to continue vitamin D and calcium OTC She declines treatment for osteoporosis at this time Encouraged daily weightbearing exercise

## 2023-06-22 NOTE — Assessment & Plan Note (Signed)
C-Met and lipid profile today Encouraged her to consume a low-fat diet Continue atorvastatin 

## 2023-06-22 NOTE — Progress Notes (Signed)
 Subjective:    Patient ID: Victoria Clarke, female    DOB: 1950/06/30, 73 y.o.   MRN: [id number]  HPI  Patient presents to clinic today for follow-up of chronic conditions.   HTN: Her BP today is 112/62  She is taking amlodipine-olmesartan as prescribed.  There is no ECG on file.   HLD: Her last LDL was 58, triglycerides 409, 09/2022.  She denies myalgias on atorvastatin.  She tries to consume low-fat diet.   Osteoporosis: She is taking calcium and vitamin D OTC.  She has never been treated for osteoporosis in the past and refused referral to endocrinology for Prolia injections.  She tries to get some weightbearing exercise in.  Recurrent UTI: She is not currently taking any medication for this. She has an upcoming appt with urology.  Review of Systems   Past Medical History:  Diagnosis Date   Colon polyp     Current Outpatient Medications  Medication Sig Dispense Refill   amLODipine-olmesartan (AZOR) 10-40 MG tablet TAKE 1 TABLET DAILY 90 tablet 2   atorvastatin (LIPITOR) 20 MG tablet Take 1 tablet (20 mg total) by mouth daily. 90 tablet 1   calcium citrate (CALCITRATE - DOSED IN MG ELEMENTAL CALCIUM) 950 (200 Ca) MG tablet Take 200 mg of elemental calcium by mouth daily.     ciprofloxacin (CIPRO) 500 MG tablet Take 1 tablet (500 mg total) by mouth 2 (two) times daily. One po bid x 7 days 14 tablet 0   sulfamethoxazole-trimethoprim (BACTRIM) 400-80 MG tablet Take 1 tablet by mouth 2 (two) times daily. 10 tablet 0   No current facility-administered medications for this visit.    No Known Allergies  Family History  Problem Relation Age of Onset   Breast cancer Mother 71       Low grade DCIS   Dementia Mother    Dementia Father    Colon polyps Father    Breast cancer Sister 55   Healthy Sister    Cancer Maternal Aunt        breast/great Aunt    Social History   Socioeconomic History   Marital status: Married    Spouse name: Not on file   Number of children: Not on  file   Years of education: Not on file   Highest education level: Not on file  Occupational History   Not on file  Tobacco Use   Smoking status: Former    Current packs/day: 0.00    Average packs/day: 1 pack/day for 30.0 years (30.0 ttl pk-yrs)    Types: Cigarettes    Start date: 04/28/1969    Quit date: 04/29/1999    Years since quitting: 24.1   Smokeless tobacco: Never  Vaping Use   Vaping status: Never Used  Substance and Sexual Activity   Alcohol use: No    Alcohol/week: 0.0 standard drinks of alcohol   Drug use: No   Sexual activity: Not on file  Other Topics Concern   Not on file  Social History Narrative   Not on file   Social Drivers of Health   Financial Resource Strain: Low Risk  (12/04/2022)   Overall Financial Resource Strain (CARDIA)    Difficulty of Paying Living Expenses: Not hard at all  Food Insecurity: No Food Insecurity (12/04/2022)   Hunger Vital Sign    Worried About Running Out of Food in the Last Year: Never true    Ran Out of Food in the Last Year: Never true  Transportation Needs: No  Transportation Needs (12/04/2022)   PRAPARE - Administrator, Civil Service (Medical): No    Lack of Transportation (Non-Medical): No  Physical Activity: Insufficiently Active (12/04/2022)   Exercise Vital Sign    Days of Exercise per Week: 3 days    Minutes of Exercise per Session: 30 min  Stress: No Stress Concern Present (12/04/2022)   [employer] of Occupational Health - Occupational Stress Questionnaire    Feeling of Stress : Only a little  Social Connections: Moderately Isolated (12/04/2022)   Social Connection and Isolation Panel [NHANES]    Frequency of Communication with Friends and Family: More than three times a week    Frequency of Social Gatherings with Friends and Family: Three times a week    Attends Religious Services: Never    Active Member of Clubs or Organizations: No    Attends Banker Meetings: Never    Marital Status:  Married  Catering manager Violence: Not At Risk (12/04/2022)   Humiliation, Afraid, Rape, and Kick questionnaire    Fear of Current or Ex-Partner: No    Emotionally Abused: No    Physically Abused: No    Sexually Abused: No     Constitutional: Denies fever, malaise, fatigue, headache or abrupt weight changes.  HEENT: Denies eye pain, eye redness, ear pain, ringing in the ears, wax buildup, runny nose, nasal congestion, bloody nose, or sore throat. Respiratory: Denies difficulty breathing, shortness of breath, cough or sputum production.   Cardiovascular: Denies chest pain, chest tightness, palpitations or swelling in the hands or feet.  Gastrointestinal: Denies abdominal pain, bloating, constipation, diarrhea or blood in the stool.  GU: Denies urgency, frequency, pain with urination, burning sensation, blood in urine, odor or discharge. Musculoskeletal: Denies decrease in range of motion, difficulty with gait, muscle pain or joint pain and swelling.  Skin: Pt reports thick discolored toenail. Denies redness, rashes, lesions or ulcercations.  Neurological: Denies dizziness, difficulty with memory, difficulty with speech or problems with balance and coordination.  Psych: Denies anxiety, depression, SI/HI.  No other specific complaints in a complete review of systems (except as listed in HPI above).      Objective:   Physical Exam  BP 112/62 (BP Location: Left Arm, Patient Position: Sitting, Cuff Size: Normal)   Ht 5\' 3"  (1.6 m)   Wt 173 lb 6.4 oz (78.7 kg)   BMI 30.72 kg/m   Wt Readings from Last 3 Encounters:  05/14/23 179 lb (81.2 kg)  05/05/23 176 lb 6.4 oz (80 kg)  12/18/22 187 lb (84.8 kg)    General: Appears her stated age, obese, in NAD. Skin: Warm, dry and intact.  Thick discolored right great toenail. HEENT: Head: normal shape and size; Eyes: sclera white, no icterus, conjunctiva pink, PERRLA and EOMs intact;  Cardiovascular: Normal rate and rhythm. S1,S2 noted.  No  murmur, rubs or gallops noted. No JVD or BLE edema. No carotid bruits noted. Pulmonary/Chest: Normal effort and positive vesicular breath sounds. No respiratory distress. No wheezes, rales or ronchi noted.  Musculoskeletal: No difficulty with gait.  Neurological: Alert and oriented. Coordination normal.  Psychiatric: Mood and affect normal. Behavior is normal. Judgment and thought content normal.    BMET    Component Value Date/Time   NA 141 10/16/2022 0809   K 5.0 10/16/2022 0809   CL 103 10/16/2022 0809   CO2 30 10/16/2022 0809   GLUCOSE 91 10/16/2022 0809   BUN 15 10/16/2022 0809   CREATININE 0.88 10/16/2022 0809  CALCIUM 9.8 10/16/2022 0809    Lipid Panel     Component Value Date/Time   CHOL 130 10/16/2022 0809   TRIG 171 (H) 10/16/2022 0809   HDL 47 (L) 10/16/2022 0809   CHOLHDL 2.8 10/16/2022 0809   LDLCALC 58 10/16/2022 0809    CBC    Component Value Date/Time   WBC 8.8 06/24/2022 1028   RBC 4.86 06/24/2022 1028   HGB 13.3 06/24/2022 1028   HCT 41.3 06/24/2022 1028   PLT 233 06/24/2022 1028   MCV 85.0 06/24/2022 1028   MCH 27.4 06/24/2022 1028   MCHC 32.2 06/24/2022 1028   RDW 14.2 06/24/2022 1028    Hgb A1C Lab Results  Component Value Date   HGBA1C 5.4 06/24/2022            Assessment & Plan:   Toenail deformity:  Referral to podiatry for further evaluation and treatment  RTC in 6 months, follow up chronic condition Nicki Reaper, NP

## 2023-06-22 NOTE — Assessment & Plan Note (Addendum)
Controlled on amlodipine-olmesartan Reinforced DASH diet and exercise for weight loss C-Met today 

## 2023-06-22 NOTE — Assessment & Plan Note (Signed)
 She has an upcoming appoint with urology

## 2023-06-22 NOTE — Patient Instructions (Signed)

## 2023-06-22 NOTE — Assessment & Plan Note (Signed)
 Encourage diet and exercise for weight loss

## 2023-06-23 ENCOUNTER — Encounter: Payer: Self-pay | Admitting: Internal Medicine

## 2023-06-23 ENCOUNTER — Other Ambulatory Visit: Payer: Self-pay | Admitting: Internal Medicine

## 2023-06-23 DIAGNOSIS — Z1231 Encounter for screening mammogram for malignant neoplasm of breast: Secondary | ICD-10-CM

## 2023-06-23 LAB — CBC
HCT: 39.7 % (ref 35.0–45.0)
Hemoglobin: 12.7 g/dL (ref 11.7–15.5)
MCH: 27.7 pg (ref 27.0–33.0)
MCHC: 32 g/dL (ref 32.0–36.0)
MCV: 86.7 fL (ref 80.0–100.0)
MPV: 11.3 fL (ref 7.5–12.5)
Platelets: 297 10*3/uL (ref 140–400)
RBC: 4.58 10*6/uL (ref 3.80–5.10)
RDW: 13.7 % (ref 11.0–15.0)
WBC: 9 10*3/uL (ref 3.8–10.8)

## 2023-06-23 LAB — HEMOGLOBIN A1C
Hgb A1c MFr Bld: 5.4 %{Hb} (ref <5.7)
Mean Plasma Glucose: 108 mg/dL
eAG (mmol/L): 6 mmol/L

## 2023-06-23 LAB — COMPLETE METABOLIC PANEL WITH GFR
AG Ratio: 1.1 (calc) (ref 1.0–2.5)
ALT: 18 U/L (ref 6–29)
AST: 20 U/L (ref 10–35)
Albumin: 3.8 g/dL (ref 3.6–5.1)
Alkaline phosphatase (APISO): 57 U/L (ref 37–153)
BUN/Creatinine Ratio: 19 (calc) (ref 6–22)
BUN: 22 mg/dL (ref 7–25)
CO2: 29 mmol/L (ref 20–32)
Calcium: 10 mg/dL (ref 8.6–10.4)
Chloride: 102 mmol/L (ref 98–110)
Creat: 1.13 mg/dL — ABNORMAL HIGH (ref 0.60–1.00)
Globulin: 3.6 g/dL (ref 1.9–3.7)
Glucose, Bld: 97 mg/dL (ref 65–99)
Potassium: 5.3 mmol/L (ref 3.5–5.3)
Sodium: 140 mmol/L (ref 135–146)
Total Bilirubin: 0.5 mg/dL (ref 0.2–1.2)
Total Protein: 7.4 g/dL (ref 6.1–8.1)
eGFR: 51 mL/min/{1.73_m2} — ABNORMAL LOW (ref >=60)

## 2023-06-23 LAB — LIPID PANEL
Cholesterol: 125 mg/dL (ref <200)
HDL: 34 mg/dL — ABNORMAL LOW (ref >=50)
LDL Cholesterol (Calc): 64 mg/dL
Non-HDL Cholesterol (Calc): 91 mg/dL (ref <130)
Total CHOL/HDL Ratio: 3.7 (calc) (ref <5.0)
Triglycerides: 198 mg/dL — ABNORMAL HIGH (ref <150)

## 2023-06-23 NOTE — Telephone Encounter (Signed)
 Requested by interface surescripts.  Requested Prescriptions  Pending Prescriptions Disp Refills   atorvastatin (LIPITOR) 20 MG tablet [Pharmacy Med Name: ATORVASTATIN TABS 20MG ] 90 tablet 2    Sig: TAKE 1 TABLET DAILY     Cardiovascular:  Antilipid - Statins Failed - 06/23/2023 10:36 AM      Failed - Lipid Panel in normal range within the last 12 months    Cholesterol  Date Value Ref Range Status  06/22/2023 125 <200 mg/dL Final   LDL Cholesterol (Calc)  Date Value Ref Range Status  06/22/2023 64 mg/dL (calc) Final    Comment:    Reference range: <100 . Desirable range <100 mg/dL for primary prevention;   <70 mg/dL for patients with CHD or diabetic patients  with > or = 2 CHD risk factors. Marland Kitchen LDL-C is now calculated using the Martin-Hopkins  calculation, which is a validated novel method providing  better accuracy than the Friedewald equation in the  estimation of LDL-C.  Horald Pollen et al. Lenox Ahr. 1610;960(45): 2061-2068  (http://education.[employer].com/faq/FAQ164)    HDL  Date Value Ref Range Status  06/22/2023 34 (L) > OR = 50 mg/dL Final   Triglycerides  Date Value Ref Range Status  06/22/2023 198 (H) <150 mg/dL Final         Passed - Patient is not pregnant      Passed - Valid encounter within last 12 months    Recent Outpatient Visits           1 month ago Recurrent UTI   [facility] [place], Salvadore Oxford, NP   1 month ago Recurrent UTI   [facility] [place], Salvadore Oxford, NP   1 month ago Urinary frequency   [place] [facility], [place] W, NP   2 months ago Acute cystitis without hematuria   [place] [facility] [place], Salvadore Oxford, NP   6 months ago Primary hypertension   [facility] [place], Salvadore Oxford, NP       Future Appointments             In 1 week Stoioff, Verna Czech, MD [facility]

## 2023-07-02 ENCOUNTER — Ambulatory Visit (INDEPENDENT_AMBULATORY_CARE_PROVIDER_SITE_OTHER): Payer: Self-pay | Admitting: Urology

## 2023-07-02 ENCOUNTER — Encounter: Payer: Self-pay | Admitting: Urology

## 2023-07-02 VITALS — BP 114/71 | HR 112 | Ht 63.0 in | Wt 172.0 lb

## 2023-07-02 DIAGNOSIS — N390 Urinary tract infection, site not specified: Secondary | ICD-10-CM

## 2023-07-02 LAB — URINALYSIS, COMPLETE
Bilirubin, UA: NEGATIVE
Glucose, UA: NEGATIVE
Ketones, UA: NEGATIVE
Nitrite, UA: NEGATIVE
Protein,UA: NEGATIVE
Specific Gravity, UA: 1.01 (ref 1.005–1.030)
Urobilinogen, Ur: 0.2 mg/dL (ref 0.2–1.0)
pH, UA: 6 (ref 5.0–7.5)

## 2023-07-02 LAB — MICROSCOPIC EXAMINATION: WBC, UA: >30 /HPF — AB (ref 0–5)

## 2023-07-02 LAB — BLADDER SCAN AMB NON-IMAGING: Scan Result: 19

## 2023-07-02 NOTE — Progress Notes (Signed)
 I, Maysun Anabel Bene, acting as a scribe for Riki Altes, MD., have documented all relevant documentation on the behalf of Riki Altes, MD, as directed by Riki Altes, MD while in the presence of Riki Altes, MD.  07/02/2023 1:27 PM   Victoria Clarke 04-09-1951 [id number]  Referring provider: Lorre Munroe, NP [address]  [place] [address]  Chief Complaint  Patient presents with   Recurrent UTI    HPI: Victoria Clarke is a 73 y.o. female referred for evaluation of recurrent UTIs.   States she has been treated for approximately 6 UTIs since the summer of 2023. On record review, she had positive urine cultures for E. coli August 2023, January 2024, 05/05/2023, 05/14/2023. She had a negative urine culture on 05/21/2023. Her typical symptoms include left back pain, chills without fever, frequency, urgency, and mild dysuria,  Is presently asymptomatic,  Is not sexually active. No previous history of urologic problems, period.  No recent upper tract imaging.    PMH: Past Medical History:  Diagnosis Date   Colon polyp     Surgical History: Past Surgical History:  Procedure Laterality Date   BREAST BIOPSY Left 04-26-14   ductal hyperplasia, 1 mm papilloma.   BREAST SURGERY Right 10/25/2009   milk duct excision in [place], atypical duct epithelial proliferation l   CESAREAN SECTION  1981   CHOLECYSTECTOMY  2006   COLONOSCOPY  2016   Dr Mechele Collin   COLONOSCOPY WITH PROPOFOL N/A 07/30/2022   Procedure: COLONOSCOPY WITH PROPOFOL;  Surgeon: Wyline Mood, MD;  Location: [facility] ENDOSCOPY;  Service: Gastroenterology;  Laterality: N/A;   EXCISION / BIOPSY BREAST / NIPPLE / DUCT Right 2011   duct excision    Home Medications:  Allergies as of 07/02/2023   No Known Allergies      Medication List        Accurate as of July 02, 2023  1:27 PM. If you have any questions, ask your nurse or doctor.          STOP taking these medications    calcium citrate 950 (200  Ca) MG tablet Commonly known as: CALCITRATE - dosed in mg elemental calcium       TAKE these medications    amLODipine-olmesartan 10-40 MG tablet Commonly known as: AZOR TAKE 1 TABLET DAILY   atorvastatin 20 MG tablet Commonly known as: LIPITOR TAKE 1 TABLET DAILY        Allergies: No Known Allergies  Family History: Family History  Problem Relation Age of Onset   Breast cancer Mother 51       Low grade DCIS   Dementia Mother    Dementia Father    Colon polyps Father    Breast cancer Sister 31   Healthy Sister    Cancer Maternal Aunt        breast/great Aunt    Social History:  reports that she quit smoking about 24 years ago. Her smoking use included cigarettes. She started smoking about 54 years ago. She has a 30 pack-year smoking history. She has never used smokeless tobacco. She reports that she does not drink alcohol and does not use drugs.   Physical Exam: BP 114/71   Pulse (!) 112   Ht 5\' 3"  (1.6 m)   Wt 172 lb (78 kg)   BMI 30.47 kg/m   Constitutional:  Alert and oriented, No acute distress. HEENT: [place] AT Respiratory: Normal respiratory effort, no increased work of breathing.  Psychiatric: Normal mood and affect.  Urinalysis Dipstick 1+ blood/3+leukocytes, microscopy >30 WBC/3-10 RBC.   Assessment & Plan:    1. Recurrent UTIs Meets the American Urological Association criteria for recurrent UTI in women.  PVR today 19 mL.  Since she does have left back pain as presenting symptoms, a renal ultrasound was ordered for upper tract screening.  We discussed possible preventative agents including probiotics and cranberry.  Also discussed the use of low-dose vaginal estrogen for potential prevention of recurrent UTIs and for refractory cases, considering low-dose antibiotic suppression.  She does have pyuria, however presently is asymptomatic, and we discussed the treatment of asymptomatic bacteriuria is not recommended. Urine culture is ordered in the event  she develops symptoms. Will be notified with the renal ultrasound results and further follow-up recommendations at that time.  I have reviewed the above documentation for accuracy and completeness, and I agree with the above.   Riki Altes, MD  [facility] [address] [place], [place] [address] [phone]

## 2023-07-05 LAB — CULTURE, URINE COMPREHENSIVE

## 2023-07-06 ENCOUNTER — Ambulatory Visit
Admission: RE | Admit: 2023-07-06 | Discharge: 2023-07-06 | Disposition: A | Discharge status: Home / Self Care | Source: Ambulatory Visit | Attending: Urology | Admitting: Urology

## 2023-07-06 DIAGNOSIS — N390 Urinary tract infection, site not specified: Secondary | ICD-10-CM | POA: Diagnosis present

## 2023-07-08 ENCOUNTER — Other Ambulatory Visit: Payer: Self-pay | Admitting: *Deleted

## 2023-07-08 MED ORDER — CEFUROXIME AXETIL 250 MG PO TABS: 250.0000 mg | ORAL_TABLET | Freq: Two times a day (BID) | ORAL | 0 refills | Status: AC

## 2023-07-13 ENCOUNTER — Encounter: Payer: Self-pay | Admitting: Urology

## 2023-07-17 ENCOUNTER — Encounter: Payer: Self-pay | Admitting: Urology

## 2023-07-17 DIAGNOSIS — N200 Calculus of kidney: Secondary | ICD-10-CM

## 2023-07-17 DIAGNOSIS — R109 Unspecified abdominal pain: Secondary | ICD-10-CM

## 2023-07-17 DIAGNOSIS — N390 Urinary tract infection, site not specified: Secondary | ICD-10-CM

## 2023-07-17 NOTE — Addendum Note (Signed)
 Addended by: Consuella Lose on: 07/17/2023 03:25 PM   Modules accepted: Orders

## 2023-07-22 ENCOUNTER — Other Ambulatory Visit: Payer: Self-pay

## 2023-07-22 ENCOUNTER — Encounter: Payer: Self-pay | Admitting: Intensive Care

## 2023-07-22 ENCOUNTER — Inpatient Hospital Stay

## 2023-07-22 ENCOUNTER — Emergency Department

## 2023-07-22 ENCOUNTER — Inpatient Hospital Stay: Admitting: Certified Registered"

## 2023-07-22 ENCOUNTER — Encounter
Admission: EM | Disposition: A | Discharge status: Home / Self Care | Payer: Self-pay | Source: Home / Self Care | Attending: Obstetrics and Gynecology

## 2023-07-22 ENCOUNTER — Inpatient Hospital Stay
Admission: EM | Admit: 2023-07-22 | Discharge: 2023-07-24 | DRG: 854 | Disposition: A | Discharge status: Home / Self Care | Attending: Internal Medicine | Admitting: Internal Medicine

## 2023-07-22 DIAGNOSIS — E6609 Other obesity due to excess calories: Secondary | ICD-10-CM | POA: Diagnosis present

## 2023-07-22 DIAGNOSIS — Z83719 Family history of colon polyps, unspecified: Secondary | ICD-10-CM

## 2023-07-22 DIAGNOSIS — I10 Essential (primary) hypertension: Secondary | ICD-10-CM | POA: Diagnosis present

## 2023-07-22 DIAGNOSIS — D649 Anemia, unspecified: Secondary | ICD-10-CM | POA: Diagnosis present

## 2023-07-22 DIAGNOSIS — N3289 Other specified disorders of bladder: Secondary | ICD-10-CM | POA: Diagnosis present

## 2023-07-22 DIAGNOSIS — M810 Age-related osteoporosis without current pathological fracture: Secondary | ICD-10-CM | POA: Diagnosis present

## 2023-07-22 DIAGNOSIS — B9620 Unspecified Escherichia coli [E. coli] as the cause of diseases classified elsewhere: Secondary | ICD-10-CM | POA: Diagnosis present

## 2023-07-22 DIAGNOSIS — Z87442 Personal history of urinary calculi: Secondary | ICD-10-CM | POA: Diagnosis not present

## 2023-07-22 DIAGNOSIS — N8110 Cystocele, unspecified: Secondary | ICD-10-CM | POA: Diagnosis present

## 2023-07-22 DIAGNOSIS — Z6827 Body mass index (BMI) 27.0-27.9, adult: Secondary | ICD-10-CM

## 2023-07-22 DIAGNOSIS — Z87440 Personal history of urinary (tract) infections: Secondary | ICD-10-CM

## 2023-07-22 DIAGNOSIS — Z87891 Personal history of nicotine dependence: Secondary | ICD-10-CM | POA: Diagnosis not present

## 2023-07-22 DIAGNOSIS — Z6830 Body mass index (BMI) 30.0-30.9, adult: Secondary | ICD-10-CM

## 2023-07-22 DIAGNOSIS — Z803 Family history of malignant neoplasm of breast: Secondary | ICD-10-CM

## 2023-07-22 DIAGNOSIS — R197 Diarrhea, unspecified: Secondary | ICD-10-CM | POA: Diagnosis present

## 2023-07-22 DIAGNOSIS — N201 Calculus of ureter: Secondary | ICD-10-CM

## 2023-07-22 DIAGNOSIS — N136 Pyonephrosis: Secondary | ICD-10-CM | POA: Diagnosis present

## 2023-07-22 DIAGNOSIS — N12 Tubulo-interstitial nephritis, not specified as acute or chronic: Principal | ICD-10-CM

## 2023-07-22 DIAGNOSIS — Z79899 Other long term (current) drug therapy: Secondary | ICD-10-CM

## 2023-07-22 DIAGNOSIS — N138 Other obstructive and reflux uropathy: Secondary | ICD-10-CM | POA: Diagnosis present

## 2023-07-22 DIAGNOSIS — E871 Hypo-osmolality and hyponatremia: Secondary | ICD-10-CM | POA: Diagnosis present

## 2023-07-22 DIAGNOSIS — N179 Acute kidney failure, unspecified: Secondary | ICD-10-CM | POA: Diagnosis present

## 2023-07-22 DIAGNOSIS — E66811 Obesity, class 1: Secondary | ICD-10-CM | POA: Diagnosis present

## 2023-07-22 DIAGNOSIS — Z860100 Personal history of colon polyps, unspecified: Secondary | ICD-10-CM | POA: Diagnosis not present

## 2023-07-22 DIAGNOSIS — E7800 Pure hypercholesterolemia, unspecified: Secondary | ICD-10-CM | POA: Diagnosis present

## 2023-07-22 DIAGNOSIS — N200 Calculus of kidney: Secondary | ICD-10-CM

## 2023-07-22 DIAGNOSIS — A419 Sepsis, unspecified organism: Secondary | ICD-10-CM | POA: Diagnosis present

## 2023-07-22 DIAGNOSIS — N390 Urinary tract infection, site not specified: Secondary | ICD-10-CM | POA: Diagnosis not present

## 2023-07-22 DIAGNOSIS — R6520 Severe sepsis without septic shock: Secondary | ICD-10-CM | POA: Diagnosis present

## 2023-07-22 HISTORY — DX: Essential (primary) hypertension: I10

## 2023-07-22 HISTORY — PX: CYSTOSCOPY W/ URETERAL STENT PLACEMENT: SHX1429

## 2023-07-22 HISTORY — DX: Pure hypercholesterolemia, unspecified: E78.00

## 2023-07-22 LAB — COMPREHENSIVE METABOLIC PANEL
ALT: 18 U/L (ref 0–44)
AST: 25 U/L (ref 15–41)
Albumin: 2.8 g/dL — ABNORMAL LOW (ref 3.5–5.0)
Alkaline Phosphatase: 68 U/L (ref 38–126)
Anion gap: 12 (ref 5–15)
BUN: 36 mg/dL — ABNORMAL HIGH (ref 8–23)
CO2: 24 mmol/L (ref 22–32)
Calcium: 8.9 mg/dL (ref 8.9–10.3)
Chloride: 96 mmol/L — ABNORMAL LOW (ref 98–111)
Creatinine, Ser: 1.91 mg/dL — ABNORMAL HIGH (ref 0.44–1.00)
GFR, Estimated: 27 mL/min — ABNORMAL LOW (ref >60)
Glucose, Bld: 100 mg/dL — ABNORMAL HIGH (ref 70–99)
Potassium: 4.3 mmol/L (ref 3.5–5.1)
Sodium: 132 mmol/L — ABNORMAL LOW (ref 135–145)
Total Bilirubin: 0.8 mg/dL (ref 0.0–1.2)
Total Protein: 7.7 g/dL (ref 6.5–8.1)

## 2023-07-22 LAB — URINALYSIS, W/ REFLEX TO CULTURE (INFECTION SUSPECTED)
Bilirubin Urine: NEGATIVE
Glucose, UA: NEGATIVE mg/dL
Ketones, ur: NEGATIVE mg/dL
Nitrite: NEGATIVE
Protein, ur: 30 mg/dL — AB
Specific Gravity, Urine: 1.014 (ref 1.005–1.030)
WBC, UA: >50 WBC/hpf (ref 0–5)
pH: 5 (ref 5.0–8.0)

## 2023-07-22 LAB — CBC WITH DIFFERENTIAL/PLATELET
Abs Immature Granulocytes: 0.16 10*3/uL — ABNORMAL HIGH (ref 0.00–0.07)
Basophils Absolute: 0.1 10*3/uL (ref 0.0–0.1)
Basophils Relative: 0 %
Eosinophils Absolute: 0 10*3/uL (ref 0.0–0.5)
Eosinophils Relative: 0 %
HCT: 38.3 % (ref 36.0–46.0)
Hemoglobin: 12.5 g/dL (ref 12.0–15.0)
Immature Granulocytes: 1 %
Lymphocytes Relative: 10 %
Lymphs Abs: 2.4 10*3/uL (ref 0.7–4.0)
MCH: 28 pg (ref 26.0–34.0)
MCHC: 32.6 g/dL (ref 30.0–36.0)
MCV: 85.9 fL (ref 80.0–100.0)
Monocytes Absolute: 1.1 10*3/uL — ABNORMAL HIGH (ref 0.1–1.0)
Monocytes Relative: 5 %
Neutro Abs: 19.9 10*3/uL — ABNORMAL HIGH (ref 1.7–7.7)
Neutrophils Relative %: 84 %
Platelets: 395 10*3/uL (ref 150–400)
RBC: 4.46 MIL/uL (ref 3.87–5.11)
RDW: 15.2 % (ref 11.5–15.5)
WBC: 23.6 10*3/uL — ABNORMAL HIGH (ref 4.0–10.5)
nRBC: 0 % (ref 0.0–0.2)

## 2023-07-22 LAB — LACTIC ACID, PLASMA: Lactic Acid, Venous: 1.7 mmol/L (ref 0.5–1.9)

## 2023-07-22 SURGERY — CYSTOSCOPY, WITH RETROGRADE PYELOGRAM AND URETERAL STENT INSERTION
Anesthesia: General | Site: Ureter | Laterality: Left

## 2023-07-22 MED ORDER — DROPERIDOL 2.5 MG/ML IJ SOLN: 0.6250 mg | Freq: Once | INTRAMUSCULAR | Status: DC | PRN

## 2023-07-22 MED ORDER — SODIUM CHLORIDE 0.9 % IV SOLN
2.0000 g | INTRAVENOUS | Status: DC
  Administered 2023-07-22: 2 g via INTRAVENOUS
  Filled 2023-07-22: qty 12.5

## 2023-07-22 MED ORDER — FENTANYL CITRATE (PF) 100 MCG/2ML IJ SOLN
INTRAMUSCULAR | Status: DC | PRN
  Administered 2023-07-22 (×3): 25 ug via INTRAVENOUS

## 2023-07-22 MED ORDER — ONDANSETRON HCL 4 MG/2ML IJ SOLN
INTRAMUSCULAR | Status: AC
  Filled 2023-07-22: qty 2

## 2023-07-22 MED ORDER — ACETAMINOPHEN 650 MG RE SUPP: 650.0000 mg | Freq: Four times a day (QID) | RECTAL | Status: DC | PRN

## 2023-07-22 MED ORDER — SODIUM CHLORIDE 0.9% FLUSH
3.0000 mL | Freq: Two times a day (BID) | INTRAVENOUS | Status: DC
  Administered 2023-07-23 (×2): 3 mL via INTRAVENOUS

## 2023-07-22 MED ORDER — HYDROMORPHONE HCL 1 MG/ML IJ SOLN: 0.5000 mg | INTRAMUSCULAR | Status: DC | PRN

## 2023-07-22 MED ORDER — SODIUM CHLORIDE 0.9 % IV BOLUS
1000.0000 mL | Freq: Once | INTRAVENOUS | Status: AC
  Administered 2023-07-22: 1000 mL via INTRAVENOUS

## 2023-07-22 MED ORDER — PHENYLEPHRINE 80 MCG/ML (10ML) SYRINGE FOR IV PUSH (FOR BLOOD PRESSURE SUPPORT)
PREFILLED_SYRINGE | INTRAVENOUS | Status: DC | PRN
  Administered 2023-07-22: 160 ug via INTRAVENOUS
  Administered 2023-07-22 (×2): 80 ug via INTRAVENOUS

## 2023-07-22 MED ORDER — DEXAMETHASONE SODIUM PHOSPHATE 10 MG/ML IJ SOLN
INTRAMUSCULAR | Status: DC | PRN
  Administered 2023-07-22: 8 mg via INTRAVENOUS

## 2023-07-22 MED ORDER — SODIUM CHLORIDE 0.9 % IR SOLN
Status: DC | PRN
  Administered 2023-07-22: 3000 mL

## 2023-07-22 MED ORDER — OXYCODONE HCL 5 MG PO TABS
5.0000 mg | ORAL_TABLET | ORAL | Status: DC | PRN
  Administered 2023-07-23: 5 mg via ORAL
  Filled 2023-07-22: qty 1

## 2023-07-22 MED ORDER — DEXAMETHASONE SODIUM PHOSPHATE 10 MG/ML IJ SOLN
INTRAMUSCULAR | Status: AC
  Filled 2023-07-22: qty 1

## 2023-07-22 MED ORDER — FENTANYL CITRATE (PF) 100 MCG/2ML IJ SOLN: 25.0000 ug | INTRAMUSCULAR | Status: DC | PRN

## 2023-07-22 MED ORDER — ONDANSETRON HCL 4 MG/2ML IJ SOLN
4.0000 mg | Freq: Once | INTRAMUSCULAR | Status: AC
  Administered 2023-07-22: 4 mg via INTRAVENOUS
  Filled 2023-07-22: qty 2

## 2023-07-22 MED ORDER — OXYCODONE HCL 5 MG PO TABS: 5.0000 mg | ORAL_TABLET | Freq: Once | ORAL | Status: DC | PRN

## 2023-07-22 MED ORDER — ACETAMINOPHEN 10 MG/ML IV SOLN: 1000.0000 mg | Freq: Once | INTRAVENOUS | Status: DC | PRN

## 2023-07-22 MED ORDER — ONDANSETRON HCL 4 MG PO TABS: 4.0000 mg | ORAL_TABLET | Freq: Four times a day (QID) | ORAL | Status: DC | PRN

## 2023-07-22 MED ORDER — SODIUM CHLORIDE 0.9 % IV SOLN
2.0000 g | Freq: Once | INTRAVENOUS | Status: AC
  Administered 2023-07-22: 2 g via INTRAVENOUS
  Filled 2023-07-22: qty 20

## 2023-07-22 MED ORDER — FENTANYL CITRATE (PF) 100 MCG/2ML IJ SOLN
INTRAMUSCULAR | Status: AC
Start: 2023-07-22 — End: ?
  Filled 2023-07-22: qty 2

## 2023-07-22 MED ORDER — ATORVASTATIN CALCIUM 20 MG PO TABS
20.0000 mg | ORAL_TABLET | Freq: Every day | ORAL | Status: DC
  Administered 2023-07-23 – 2023-07-24 (×2): 20 mg via ORAL
  Filled 2023-07-22 (×2): qty 1

## 2023-07-22 MED ORDER — STERILE WATER FOR IRRIGATION IR SOLN
Status: DC | PRN
  Administered 2023-07-22: 500 mL

## 2023-07-22 MED ORDER — IOHEXOL 180 MG/ML  SOLN
INTRAMUSCULAR | Status: DC | PRN
  Administered 2023-07-22: 10 mL

## 2023-07-22 MED ORDER — ONDANSETRON HCL 4 MG/2ML IJ SOLN
INTRAMUSCULAR | Status: DC | PRN
  Administered 2023-07-22: 4 mg via INTRAVENOUS

## 2023-07-22 MED ORDER — OXYCODONE HCL 5 MG/5ML PO SOLN: 5.0000 mg | Freq: Once | ORAL | Status: DC | PRN

## 2023-07-22 MED ORDER — ACETAMINOPHEN 325 MG PO TABS
650.0000 mg | ORAL_TABLET | Freq: Four times a day (QID) | ORAL | Status: DC | PRN
  Administered 2023-07-23: 650 mg via ORAL
  Filled 2023-07-22: qty 2

## 2023-07-22 MED ORDER — PROPOFOL 10 MG/ML IV BOLUS
INTRAVENOUS | Status: AC
  Filled 2023-07-22: qty 20

## 2023-07-22 MED ORDER — PROPOFOL 10 MG/ML IV BOLUS
INTRAVENOUS | Status: DC | PRN
  Administered 2023-07-22: 30 mg via INTRAVENOUS
  Administered 2023-07-22: 100 mg via INTRAVENOUS

## 2023-07-22 MED ORDER — SODIUM CHLORIDE 0.9 % IV BOLUS
500.0000 mL | Freq: Once | INTRAVENOUS | Status: AC
  Administered 2023-07-22: 500 mL via INTRAVENOUS

## 2023-07-22 MED ORDER — LIDOCAINE HCL (CARDIAC) PF 100 MG/5ML IV SOSY
PREFILLED_SYRINGE | INTRAVENOUS | Status: DC | PRN
  Administered 2023-07-22: 100 mg via INTRAVENOUS

## 2023-07-22 MED ORDER — ONDANSETRON HCL 4 MG/2ML IJ SOLN: 4.0000 mg | Freq: Four times a day (QID) | INTRAMUSCULAR | Status: DC | PRN

## 2023-07-22 MED ORDER — LOPERAMIDE HCL 2 MG PO CAPS
2.0000 mg | ORAL_CAPSULE | Freq: Once | ORAL | Status: AC
  Administered 2023-07-22: 2 mg via ORAL
  Filled 2023-07-22: qty 1

## 2023-07-22 MED ORDER — LIDOCAINE HCL (PF) 2 % IJ SOLN
INTRAMUSCULAR | Status: AC
  Filled 2023-07-22: qty 5

## 2023-07-22 MED ORDER — LACTATED RINGERS IV SOLN
150.0000 mL/h | INTRAVENOUS | Status: DC
  Administered 2023-07-22 – 2023-07-23 (×3): 150 mL/h via INTRAVENOUS

## 2023-07-22 SURGICAL SUPPLY — 20 items
BAG DRAIN SIEMENS DORNER NS (MISCELLANEOUS) ×2 IMPLANT
BAG URO DRAIN 4000ML (MISCELLANEOUS) ×1 IMPLANT
BRUSH SCRUB EZ 4% CHG (MISCELLANEOUS) ×2 IMPLANT
CATH FOL 2WAY LX 16X30 (CATHETERS) ×1 IMPLANT
CATH URETL OPEN 5X70 (CATHETERS) ×2 IMPLANT
GLOVE BIO SURGEON STRL SZ 6.5 (GLOVE) ×2 IMPLANT
GOWN STRL REUS W/ TWL LRG LVL3 (GOWN DISPOSABLE) ×4 IMPLANT
GUIDEWIRE ANG ZIPWIRE 035X150 (WIRE) ×1 IMPLANT
GUIDEWIRE STR DUAL SENSOR (WIRE) ×2 IMPLANT
KIT TURNOVER CYSTO (KITS) ×2 IMPLANT
PACK CYSTO AR (MISCELLANEOUS) ×2 IMPLANT
SET CYSTO W/LG BORE CLAMP LF (SET/KITS/TRAYS/PACK) ×2 IMPLANT
SOL .9 NS 3000ML IRR UROMATIC (IV SOLUTION) ×2 IMPLANT
STENT URET 6FRX24 CONTOUR (STENTS) IMPLANT
STENT URET 6FRX26 CONTOUR (STENTS) IMPLANT
SURGILUBE 2OZ TUBE FLIPTOP (MISCELLANEOUS) ×2 IMPLANT
SYR TOOMEY IRRIG 70ML (MISCELLANEOUS) ×2 IMPLANT
SYRINGE TOOMEY IRRIG 70ML (MISCELLANEOUS) ×2 IMPLANT
WATER STERILE IRR 1000ML POUR (IV SOLUTION) ×2 IMPLANT
WATER STERILE IRR 500ML POUR (IV SOLUTION) ×2 IMPLANT

## 2023-07-22 NOTE — ED Provider Notes (Signed)
 [facility] Provider Note    Event Date/Time   First MD Initiated Contact with Patient 07/22/23 1046     (approximate)   History   Chief Complaint Recurrent UTI   HPI  Victoria Clarke is a 73 y.o. female with past medical history of hypertension and hyperlipidemia who presents to the ED complaining of flank pain.  Patient reports that she has been dealing with intermittent UTI symptoms since December, has been on multiple rounds of antibiotics without significant relief.  She most recently completed a course of cefuroxime about 2 weeks ago, has had increasing pain in her left flank with nausea, vomiting, and diarrhea since then.  She reports "low-grade fevers" into the past few nights with maximum temperature of 99.9.  She denies any dysuria or hematuria, does not have any pain in her abdomen.  She has been following with Dr. Lonna Cobb of urology for this problem, had been scheduled for CT imaging in 2 days but presented to the ED due to worsening symptoms.     Physical Exam   Triage Vital Signs: ED Triage Vitals  Encounter Vitals Group     BP 07/22/23 1010 106/65     Systolic BP Percentile --      Diastolic BP Percentile --      Pulse Rate 07/22/23 1010 (!) 119     Resp 07/22/23 1010 17     Temp 07/22/23 1010 97.8 F (36.6 C)     Temp Source 07/22/23 1010 Oral     SpO2 07/22/23 1010 94 %     Weight 07/22/23 1024 164 lb (74.4 kg)     Height 07/22/23 1024 5\' 3"  (1.6 m)     Head Circumference --      Peak Flow --      Pain Score 07/22/23 1024 5     Pain Loc --      Pain Education --      Exclude from Growth Chart --     Most recent vital signs: Vitals:   07/22/23 1010 07/22/23 1405  BP: 106/65 90/75  Pulse: (!) 119 (!) 57  Resp: 17 16  Temp: 97.8 F (36.6 C)   SpO2: 94% 99%    Constitutional: Alert and oriented. Eyes: Conjunctivae are normal. Head: Atraumatic. Nose: No congestion/rhinnorhea. Mouth/Throat: Mucous membranes are moist.   Cardiovascular: Normal rate, regular rhythm. Grossly normal heart sounds.  2+ radial pulses bilaterally. Respiratory: Normal respiratory effort.  No retractions. Lungs CTAB. Gastrointestinal: Soft and nontender.  Left CVA tenderness noted.  No distention. Musculoskeletal: No lower extremity tenderness nor edema.  Neurologic:  Normal speech and language. No gross focal neurologic deficits are appreciated.    ED Results / Procedures / Treatments   Labs (all labs ordered are listed, but only abnormal results are displayed) Labs Reviewed  COMPREHENSIVE METABOLIC PANEL - Abnormal; Notable for the following components:      Result Value   Sodium 132 (*)    Chloride 96 (*)    Glucose, Bld 100 (*)    BUN 36 (*)    Creatinine, Ser 1.91 (*)    Albumin 2.8 (*)    GFR, Estimated 27 (*)    All other components within normal limits  CBC WITH DIFFERENTIAL/PLATELET - Abnormal; Notable for the following components:   WBC 23.6 (*)    Neutro Abs 19.9 (*)    Monocytes Absolute 1.1 (*)    Abs Immature Granulocytes 0.16 (*)    All other components within  normal limits  URINALYSIS, W/ REFLEX TO CULTURE (INFECTION SUSPECTED) - Abnormal; Notable for the following components:   Color, Urine AMBER (*)    APPearance CLOUDY (*)    Hgb urine dipstick SMALL (*)    Protein, ur 30 (*)    Leukocytes,Ua LARGE (*)    Bacteria, UA MANY (*)    All other components within normal limits  URINE CULTURE  CULTURE, BLOOD (ROUTINE X 2)  CULTURE, BLOOD (ROUTINE X 2)  GASTROINTESTINAL PANEL BY PCR, STOOL (REPLACES STOOL CULTURE)  C DIFFICILE QUICK SCREEN W PCR REFLEX    LACTIC ACID, PLASMA    RADIOLOGY CT renal protocol reviewed and interpreted by me with large left-sided proximal ureteral stone.  PROCEDURES:  Critical Care performed: Yes, see critical care procedure note(s)  .Critical Care  Performed by: Chesley Noon, MD Authorized by: Chesley Noon, MD   Critical care provider statement:     Critical care time (minutes):  30   Critical care time was exclusive of:  Separately billable procedures and treating other patients and teaching time   Critical care was necessary to treat or prevent imminent or life-threatening deterioration of the following conditions:  Sepsis and renal failure   Critical care was time spent personally by me on the following activities:  Development of treatment plan with patient or surrogate, discussions with consultants, evaluation of patient's response to treatment, examination of patient, ordering and review of laboratory studies, ordering and review of radiographic studies, ordering and performing treatments and interventions, pulse oximetry, re-evaluation of patient's condition and review of old charts   I assumed direction of critical care for this patient from another provider in my specialty: no     Care discussed with: admitting provider      MEDICATIONS ORDERED IN ED: Medications  cefTRIAXone (ROCEPHIN) 2 g in sodium chloride 0.9 % 100 mL IVPB (0 g Intravenous Stopped 07/22/23 1309)  sodium chloride 0.9 % bolus 1,000 mL (0 mLs Intravenous Stopped 07/22/23 1309)  ondansetron (ZOFRAN) injection 4 mg (4 mg Intravenous Given 07/22/23 1142)  loperamide (IMODIUM) capsule 2 mg (2 mg Oral Given 07/22/23 1401)  sodium chloride 0.9 % bolus 1,000 mL (1,000 mLs Intravenous New Bag/Given 07/22/23 1423)  sodium chloride 0.9 % bolus 500 mL (500 mLs Intravenous New Bag/Given 07/22/23 1423)     IMPRESSION / MDM / ASSESSMENT AND PLAN / ED COURSE  I reviewed the triage vital signs and the nursing notes.                              73 y.o. female with past medical history of hypertension and hyperlipidemia who presents to the ED complaining of recurrent UTI over the past few months with increasing flank pain and nausea over the past week.  Patient's presentation is most consistent with acute presentation with potential threat to life or bodily  function.  Differential diagnosis includes, but is not limited to, sepsis, UTI, pyelonephritis, AKI, anemia, electrolyte abnormality, gastroenteritis.  Patient nontoxic-appearing and in no acute distress, vital signs remarkable for tachycardia but otherwise reassuring.  She does have left CVA tenderness, I reviewed recent renal ultrasound that showed approximately 16 mm stone in the left kidney but no associated hydronephrosis.  We will further assess with CT imaging, presentation concerning for sepsis given tachycardia and significant leukocytosis.  We will start on IV Rocephin as prior urine culture showed pan sensitive E. coli.  Labs without significant anemia or electrolyte  abnormality, patient does have AKI, LFTs are unremarkable and lactic acid within normal limits.  Patient declines pain medication, will treat with IV Zofran and hydrate with IV fluids.  CT renal protocol shows 2.3 cm stone at the left UPJ with associated severe hydronephrosis.  Urinalysis also concerning for infection, was sent for culture and patient was given sepsis dose of IV Rocephin.  Case discussed with Dr. Apolinar Junes of urology, who will plan for ureteral stenting later this afternoon.  Case discussed with hospitalist for admission.      FINAL CLINICAL IMPRESSION(S) / ED DIAGNOSES   Final diagnoses:  Pyelonephritis  Kidney stone     Rx / DC Orders   ED Discharge Orders     None        Note:  This document was prepared using Dragon voice recognition software and may include unintentional dictation errors.   Chesley Noon, MD 07/22/23 1440

## 2023-07-22 NOTE — Consult Note (Signed)
 Pharmacy Antibiotic Note  Victoria Clarke is a 73 y.o. female admitted on 07/22/2023 with UTI. CT renal protocol shows 2.3 cm stone at the left UPJ with associated severe hydronephrosis. WBC 23.6, Scr 1.91 with BL ~ 1 (AKI). Pharmacy has been consulted for Cefepime dosing.  Plan: Cefepime 2gm IV q 24hrs  Height: 5\' 3"  (160 cm) Weight: 74.4 kg (164 lb) IBW/kg (Calculated) : 52.4  Temp (24hrs), Avg:97.8 F (36.6 C), Min:97.8 F (36.6 C), Max:97.8 F (36.6 C)  Recent Labs  Lab 07/22/23 1004 07/22/23 1011  WBC 23.6*  --   CREATININE 1.91*  --   LATICACIDVEN  --  1.7    Estimated Creatinine Clearance: 25.3 mL/min (A) (by C-G formula based on SCr of 1.91 mg/dL (H)).    No Known Allergies  Antimicrobials this admission: 3/26 ceftriaxone x 1 3/26 cefepime >  Microbiology results: 3/6: urine culture Ecoli; pan-sensitive (Rx cefuroxime 250mg  po BID) 3/26 BCx: pending   Thank you for allowing pharmacy to be a part of this patient's care. Salisa Broz Rodriguez-Guzman PharmD, BCPS 07/22/2023 3:35 PM

## 2023-07-22 NOTE — Consult Note (Signed)
 Urology Consult  I have been asked to see the patient by Dr. Larinda Buttery, for evaluation and management of obstructing left ureteral stone.  Chief Complaint: left flank pain  History of Present Illness: Victoria Clarke is a 73 y.o. year old female with a personal history of recurrent UTIs recently evaluated by Dr. Lonna Cobb on to have left-sided hydronephrosis.  A CT scan was ordered.  In the interim, she presented to the emergency room today with concerns for sepsis.  She underwent a CT stone protocol that shows severe left hydronephrosis with a 2.3 cm left UPJ stone along with cortical thinning of the kidney representing longstanding obstruction.  At home, she been having some low-grade fevers Tmax of 99.9.  Is not having any lower urinary tract symptoms however.  In the emergency room, she was tachycardic to 119 with a soft blood pressure 106 of 65.  She also had markedly elevated white blood cell count to 23.6, Cr 1.91 and a grossly positive urinalysis.  Meeting sepsis criteria, she was resuscitated with fluids, given IV ceftriaxone, admitted to the medicine service and urology was to evaluate for source control.    Past Medical History:  Diagnosis Date   Colon polyp    High cholesterol    Hypertension     Past Surgical History:  Procedure Laterality Date   BREAST BIOPSY Left 04-26-14   ductal hyperplasia, 1 mm papilloma.   BREAST SURGERY Right 10/25/2009   milk duct excision in [place], atypical duct epithelial proliferation l   CESAREAN SECTION  1981   CHOLECYSTECTOMY  2006   COLONOSCOPY  2016   Dr Mechele Collin   COLONOSCOPY WITH PROPOFOL N/A 07/30/2022   Procedure: COLONOSCOPY WITH PROPOFOL;  Surgeon: Wyline Mood, MD;  Location: [facility] ENDOSCOPY;  Service: Gastroenterology;  Laterality: N/A;   EXCISION / BIOPSY BREAST / NIPPLE / DUCT Right 2011   duct excision    Home Medications:  No outpatient medications have been marked as taking for the 07/22/23 encounter [facility] Encounter).     Allergies: No Known Allergies  Family History  Problem Relation Age of Onset   Breast cancer Mother 66       Low grade DCIS   Dementia Mother    Dementia Father    Colon polyps Father    Breast cancer Sister 93   Healthy Sister    Cancer Maternal Aunt        breast/great Aunt    Social History:  reports that she quit smoking about 24 years ago. Her smoking use included cigarettes. She started smoking about 54 years ago. She has a 30 pack-year smoking history. She has never used smokeless tobacco. She reports that she does not drink alcohol and does not use drugs.  ROS: A complete review of systems was performed.  All systems are negative except for pertinent findings as noted.  Physical Exam:  Vital signs in last 24 hours: Temp:  [97.8 F (36.6 C)] 97.8 F (36.6 C) (03/26 1010) Pulse Rate:  [57-119] 92 (03/26 1530) Resp:  [16-17] 16 (03/26 1405) BP: (90-114)/(58-75) 114/58 (03/26 1530) SpO2:  [94 %-100 %] 100 % (03/26 1530) Weight:  [74.4 kg] 74.4 kg (03/26 1024) Constitutional:  Alert and oriented, No acute distress HEENT: [place] AT, moist mucus membranes.  Trachea midline, no masses GI: Abdomen is soft, nontender, nondistended, no abdominal masses GU: No CVA tenderness Skin: No rashes, bruises or suspicious lesions Neurologic: Grossly intact, no focal deficits, moving all 4 extremities Psychiatric:  Normal mood and affect   Laboratory Data:  Recent Labs    07/22/23 1004  WBC 23.6*  HGB 12.5  HCT 38.3   Recent Labs    07/22/23 1004  NA 132*  K 4.3  CL 96*  CO2 24  GLUCOSE 100*  BUN 36*  CREATININE 1.91*  CALCIUM 8.9   No results for input(s): "LABPT", "INR" in the last 72 hours. No results for input(s): "LABURIN" in the last 72 hours. Results for orders placed or performed in visit on 07/02/23  Microscopic Examination     Status: Abnormal   Collection Time: 07/02/23 10:35 AM   Urine  Result Value Ref Range Status   WBC, UA >30 (A) 0 - 5 /hpf Final    RBC, Urine 3-10 (A) 0 - 2 /hpf Final   Epithelial Cells (non renal) 0-10 0 - 10 /hpf Final   Bacteria, UA Many (A) None seen/Few Final  CULTURE, URINE COMPREHENSIVE     Status: Abnormal   Collection Time: 07/02/23  3:31 PM   Specimen: Urine   UR  Result Value Ref Range Status   Urine Culture, Comprehensive Final report (A)  Final   Organism ID, Bacteria Escherichia coli (A)  Final    Comment: Cefazolin with an MIC <=16 predicts susceptibility to the oral agents cefaclor, cefdinir, cefpodoxime, cefprozil, cefuroxime, cephalexin, and loracarbef when used for therapy of uncomplicated urinary tract infections due to E. coli, Klebsiella pneumoniae, and Proteus mirabilis. Greater than 100,000 colony forming units per mL    ANTIMICROBIAL SUSCEPTIBILITY Comment  Final    Comment:       ** S = Susceptible; I = Intermediate; R = Resistant **                    P = Positive; N = Negative             MICS are expressed in micrograms per mL    Antibiotic                 RSLT#1    RSLT#2    RSLT#3    RSLT#4 Amoxicillin/Clavulanic Acid    S Ampicillin                     S Cefazolin                      S Cefepime                       S Cefoxitin                      S Cefpodoxime                    S Ceftriaxone                    S Ciprofloxacin                  S Ertapenem                      S Gentamicin                     S Levofloxacin                   S Meropenem  S Nitrofurantoin                 S Piperacillin/Tazobactam        S Tetracycline                   S Tobramycin                     S Trimethoprim/Sulfa             S      Radiologic Imaging: CT Renal Stone Study Result Date: 07/22/2023 CLINICAL DATA:  Flank pain, fever. History of multiple urinary tract infections. EXAM: CT ABDOMEN AND PELVIS WITHOUT CONTRAST TECHNIQUE: Multidetector CT imaging of the abdomen and pelvis was performed following the standard protocol without IV contrast.  RADIATION DOSE REDUCTION: This exam was performed according to the departmental dose-optimization program which includes automated exposure control, adjustment of the mA and/or kV according to patient size and/or use of iterative reconstruction technique. COMPARISON:  None Available. FINDINGS: Lower chest: No acute abnormality. Hepatobiliary: Status post cholecystectomy. No biliary dilatation is noted. Slightly nodular hepatic contours are noted suggesting possible hepatic cirrhosis. Pancreas: Unremarkable. No pancreatic ductal dilatation or surrounding inflammatory changes. Spleen: Normal in size without focal abnormality. Adrenals/Urinary Tract: Adrenal glands appear normal. Severe left hydronephrosis is noted secondary to 2.3 cm calculus at left ureteropelvic junction. There does appear to be cortical thinning involving the left kidney suggesting longstanding obstruction. Multiple smaller calculi are noted throughout the left intrarenal collecting system. Probable small right renal cyst is noted for which no further follow-up is required. Urinary bladder is decompressed. Stomach/Bowel: Stomach is within normal limits. Appendix appears normal. No evidence of bowel wall thickening, distention, or inflammatory changes. Vascular/Lymphatic: Aortic atherosclerosis. No enlarged abdominal or pelvic lymph nodes. Reproductive: Uterus and bilateral adnexa are unremarkable. Other: No abdominal wall hernia or abnormality. No abdominopelvic ascites. Musculoskeletal: No acute or significant osseous findings. IMPRESSION: Severe left hydronephrosis is noted secondary to 2.3 cm calculus at left ureteropelvic junction. Cortical thinning of left kidney is noted suggesting longstanding obstruction. Innumerable smaller other calculi are noted throughout the left intrarenal collecting system. Slightly nodular hepatic contours are noted suggesting possible hepatic cirrhosis. Aortic Atherosclerosis (ICD10-I70.0). Electronically Signed    By: Lupita Raider M.D.   On: 07/22/2023 13:31    Impression/ Plan:  1.  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  We discussed ureteral stenting along with staged procedure.  Risk of the surgery were discussed in detail occluding stent discomfort, need for further procedures amongst others.  All questions were answered.  She has been admitted to the medicine service for supportive care and IV antibiotics.  Will see her in the morning and discuss definitive management at that point.  2.  Acute kidney injury-although she has left renal atrophy, her creatinine was normal up until a month ago.  Obstruction is chronic.  Acute kidney injury likely related to prerenal causes.  Avoid nephrotoxic agents and hydrate adequately.  07/22/2023, 4:07 PM  Vanna Scotland,  MD

## 2023-07-22 NOTE — Anesthesia Postprocedure Evaluation (Signed)
 Anesthesia Post Note  Patient: Victoria Clarke  Procedure(s) Performed: CYSTOSCOPY, WITH RETROGRADE PYELOGRAM AND URETERAL STENT INSERTION (Left: Ureter)  Patient location during evaluation: PACU Anesthesia Type: General Level of consciousness: awake and alert Pain management: pain level controlled Vital Signs Assessment: post-procedure vital signs reviewed and stable Respiratory status: spontaneous breathing, nonlabored ventilation and respiratory function stable Cardiovascular status: blood pressure returned to baseline and stable Postop Assessment: no apparent nausea or vomiting Anesthetic complications: no   No notable events documented.   Last Vitals:  Vitals:   07/22/23 1845 07/22/23 1900  BP: 117/61 107/63  Pulse: 88 92  Resp: 10 11  Temp:    SpO2: 97% 95%    Last Pain:  Vitals:   07/22/23 1845  TempSrc:   PainSc: 0-No pain                 Foye Deer

## 2023-07-22 NOTE — Assessment & Plan Note (Signed)
 Large nephrolithiasis with severe hydronephrosis and AKI.  - Urology consulted; appreciate their recommendations - IV fluids - Repeat BMP in the a.m. - Hold home nephrotoxic agents

## 2023-07-22 NOTE — Anesthesia Preprocedure Evaluation (Addendum)
 Anesthesia Evaluation  Patient identified by MRN, date of birth, ID band Patient awake    Reviewed: Allergy & Precautions, H&P , NPO status , Patient's Chart, lab work & pertinent test results  Airway Mallampati: II  TM Distance: >3 FB Neck ROM: full    Dental  (+) Loose,  Extremely loose bridge at the lateral connection to tooth. There is obvious space between the gums and the bridge with movement. She was informed of the risk of further loosening or accidental removal of bridge. We discussed that our team will be very diligent in managing the airway. :   Pulmonary former smoker   Pulmonary exam normal        Cardiovascular hypertension, Normal cardiovascular exam     Neuro/Psych negative neurological ROS  negative psych ROS   GI/Hepatic negative GI ROS, Neg liver ROS,,,  Endo/Other  negative endocrine ROS    Renal/GU Renal diseaseAKI with severe hydronephrosis      Musculoskeletal   Abdominal   Peds  Hematology negative hematology ROS (+)   Anesthesia Other Findings Past Medical History: No date: Colon polyp No date: High cholesterol No date: Hypertension  Past Surgical History: 04-26-14: BREAST BIOPSY; Left     Comment:  ductal hyperplasia, 1 mm papilloma. 10/25/2009: BREAST SURGERY; Right     Comment:  milk duct excision in [place], atypical duct epithelial              proliferation l 1981: CESAREAN SECTION 2006: CHOLECYSTECTOMY 2016: COLONOSCOPY     Comment:  Dr Mechele Collin 07/30/2022: COLONOSCOPY WITH PROPOFOL; N/A     Comment:  Procedure: COLONOSCOPY WITH PROPOFOL;  Surgeon: Wyline Mood, MD;  Location: [facility] ENDOSCOPY;  Service:               Gastroenterology;  Laterality: N/A; 2011: EXCISION / BIOPSY BREAST / NIPPLE / DUCT; Right     Comment:  duct excision  BMI    Body Mass Index: 29.05 kg/m      Reproductive/Obstetrics negative OB ROS                              Anesthesia Physical Anesthesia Plan  ASA: 3  Anesthesia Plan: General   Post-op Pain Management: Ofirmev IV (intra-op)* and Minimal or no pain anticipated   Induction: Intravenous  PONV Risk Score and Plan: 3 and Dexamethasone and Ondansetron  Airway Management Planned: LMA  Additional Equipment:   Intra-op Plan:   Post-operative Plan: Extubation in OR  Informed Consent: I have reviewed the patients History and Physical, chart, labs and discussed the procedure including the risks, benefits and alternatives for the proposed anesthesia with the patient or authorized representative who has indicated his/her understanding and acceptance.     Dental Advisory Given  Plan Discussed with: Anesthesiologist, CRNA and Surgeon  Anesthesia Plan Comments:         Anesthesia Quick Evaluation

## 2023-07-22 NOTE — Sepsis Progress Note (Signed)
 eLink is following this Code Sepsis.

## 2023-07-22 NOTE — Assessment & Plan Note (Signed)
-   Hold home antihypertensives due to high risk for hypotension

## 2023-07-22 NOTE — Assessment & Plan Note (Signed)
 Patient is presenting with tachycardia, leukocytosis, complicated by AKI secondary to complicated UTI and obstructive nephropathy.  I suspect her recurrent UTIs in the past may have been due to kidney stone acting as a nidus.  - Urology consulted; appreciate their recommendations - Telemetry monitoring - S/p 2.5 L bolus - Continue maintenance fluids - Urine and blood cultures - Given multiple rounds of antibiotics recently, cefepime per pharmacy dosing

## 2023-07-22 NOTE — ED Triage Notes (Signed)
 C/o little urine output, chills, and low grade fever. Reports multiple UTIs since December. Weakness in legs.    Reports she currently has a 16mm kidney stone and has appointment scheduled for Friday

## 2023-07-22 NOTE — H&P (Signed)
 History and Physical    Patient: Victoria Clarke FAO:[id number] DOB: May 18, 1950 DOA: 07/22/2023 DOS: the patient was seen and examined on 07/22/2023 PCP: Lorre Munroe, NP  Patient coming from: Home  Chief Complaint:  Chief Complaint  Patient presents with   Recurrent UTI   HPI: Victoria Clarke is a 73 y.o. female with medical history significant of Recurrent UTI, recurrent nephrolithiasis, hypertension, hyperlipidemia, osteoporosis, who presents to the ED due to nausea vomiting.  [name] states that she has been suffering with recurrent UTIs for quite some time now, but she feels like this 1 started approximately 3 weeks to 1 month ago.  She has been on antibiotics but has not seem to help at all.  She states that she usually has severe nausea, vomiting, diarrhea when she has a UTI that has been ongoing since her symptoms started 3 weeks ago.  However in the last 2 days, her urine output has significantly decreased.  She endorses diaphoresis and chills.  ED course: On arrival to the ED, patient was normotensive at 106/65 with heart rate of 119.  She was saturating at 94% on room air.  She was afebrile at 97.8.  Initial workup notable for WBC 23.6, sodium 132, creatinine 1.91 with GFR 27.  Lactic acid 1.7.  Urinalysis with large leukocytes, many bacteria.  CT renal stone study with severe left hydronephrosis with 2.3 cm calculus.  Urology consulted with plans to go to the OR.  [facility] contacted for admission.   Review of Systems: As mentioned in the history of present illness. All other systems reviewed and are negative.  Past Medical History:  Diagnosis Date   Colon polyp    High cholesterol    Hypertension    Past Surgical History:  Procedure Laterality Date   BREAST BIOPSY Left 04-26-14   ductal hyperplasia, 1 mm papilloma.   BREAST SURGERY Right 10/25/2009   milk duct excision in [place], atypical duct epithelial proliferation l   CESAREAN SECTION  1981   CHOLECYSTECTOMY  2006    COLONOSCOPY  2016   Dr Mechele Collin   COLONOSCOPY WITH PROPOFOL N/A 07/30/2022   Procedure: COLONOSCOPY WITH PROPOFOL;  Surgeon: Wyline Mood, MD;  Location: [facility] ENDOSCOPY;  Service: Gastroenterology;  Laterality: N/A;   EXCISION / BIOPSY BREAST / NIPPLE / DUCT Right 2011   duct excision   Social History:  reports that she quit smoking about 24 years ago. Her smoking use included cigarettes. She started smoking about 54 years ago. She has a 30 pack-year smoking history. She has never used smokeless tobacco. She reports that she does not drink alcohol and does not use drugs.  No Known Allergies  Family History  Problem Relation Age of Onset   Breast cancer Mother 78       Low grade DCIS   Dementia Mother    Dementia Father    Colon polyps Father    Breast cancer Sister 65   Healthy Sister    Cancer Maternal Aunt        breast/great Aunt    Prior to Admission medications   Medication Sig Start Date End Date Taking? Authorizing Provider  amLODipine-olmesartan (AZOR) 10-40 MG tablet TAKE 1 TABLET DAILY 12/24/22   Lorre Munroe, NP  atorvastatin (LIPITOR) 20 MG tablet TAKE 1 TABLET DAILY 06/23/23   Lorre Munroe, NP    Physical Exam: Vitals:   07/22/23 1024 07/22/23 1405 07/22/23 1530 07/22/23 1642  BP:  90/75 (!) 114/58 (!) 106/58  Pulse:  Marland Kitchen)  57 92 (!) 104  Resp:  16  16  Temp:    97.8 F (36.6 C)  TempSrc:    Temporal  SpO2:  99% 100% 96%  Weight: 74.4 kg   74.4 kg  Height: 5\' 3"  (1.6 m)   5\' 3"  (1.6 m)   Physical Exam Vitals and nursing note reviewed.  Constitutional:      Appearance: She is normal weight. She is ill-appearing.  HENT:     Head: Normocephalic and atraumatic.     Mouth/Throat:     Mouth: Mucous membranes are dry.  Eyes:     Conjunctiva/sclera: Conjunctivae normal.     Pupils: Pupils are equal, round, and reactive to light.  Cardiovascular:     Rate and Rhythm: Regular rhythm. Tachycardia present.     Heart sounds: No murmur heard. Pulmonary:      Effort: Pulmonary effort is normal. No respiratory distress.     Breath sounds: Normal breath sounds. No wheezing or rales.  Abdominal:     Tenderness: There is no abdominal tenderness. There is left CVA tenderness. There is no guarding.  Musculoskeletal:     Right lower leg: No edema.     Left lower leg: No edema.  Skin:    General: Skin is warm and dry.  Neurological:     General: No focal deficit present.     Mental Status: She is alert and oriented to person, place, and time. Mental status is at baseline.  Psychiatric:        Mood and Affect: Mood normal.        Behavior: Behavior normal.    Data Reviewed: CBC with WBC 23.6, hemoglobin 12.5, platelets 395 CMP with sodium of 132, potassium 4.3, bicarb 24, glucose 100, BUN 36, creatinine 1.91, AST 25, ALT 18, GFR 27 Lactic acid 1.7 Urinalysis with small hematuria, large leukocytes, proteinuria many bacteria  Urine culture obtained on 07/02/2023 with pansensitive E. coli.  DG OR UROLOGY CYSTO IMAGE ([facility] ONLY) Result Date: 07/22/2023 There is no interpretation for this exam.  This order is for images obtained during a surgical procedure.  Please See "Surgeries" Tab for more information regarding the procedure.   CT Renal Stone Study Result Date: 07/22/2023 CLINICAL DATA:  Flank pain, fever. History of multiple urinary tract infections. EXAM: CT ABDOMEN AND PELVIS WITHOUT CONTRAST TECHNIQUE: Multidetector CT imaging of the abdomen and pelvis was performed following the standard protocol without IV contrast. RADIATION DOSE REDUCTION: This exam was performed according to the departmental dose-optimization program which includes automated exposure control, adjustment of the mA and/or kV according to patient size and/or use of iterative reconstruction technique. COMPARISON:  None Available. FINDINGS: Lower chest: No acute abnormality. Hepatobiliary: Status post cholecystectomy. No biliary dilatation is noted. Slightly nodular hepatic contours  are noted suggesting possible hepatic cirrhosis. Pancreas: Unremarkable. No pancreatic ductal dilatation or surrounding inflammatory changes. Spleen: Normal in size without focal abnormality. Adrenals/Urinary Tract: Adrenal glands appear normal. Severe left hydronephrosis is noted secondary to 2.3 cm calculus at left ureteropelvic junction. There does appear to be cortical thinning involving the left kidney suggesting longstanding obstruction. Multiple smaller calculi are noted throughout the left intrarenal collecting system. Probable small right renal cyst is noted for which no further follow-up is required. Urinary bladder is decompressed. Stomach/Bowel: Stomach is within normal limits. Appendix appears normal. No evidence of bowel wall thickening, distention, or inflammatory changes. Vascular/Lymphatic: Aortic atherosclerosis. No enlarged abdominal or pelvic lymph nodes. Reproductive: Uterus and bilateral adnexa are unremarkable.  Other: No abdominal wall hernia or abnormality. No abdominopelvic ascites. Musculoskeletal: No acute or significant osseous findings. IMPRESSION: Severe left hydronephrosis is noted secondary to 2.3 cm calculus at left ureteropelvic junction. Cortical thinning of left kidney is noted suggesting longstanding obstruction. Innumerable smaller other calculi are noted throughout the left intrarenal collecting system. Slightly nodular hepatic contours are noted suggesting possible hepatic cirrhosis. Aortic Atherosclerosis (ICD10-I70.0). Electronically Signed   By: Lupita Raider M.D.   On: 07/22/2023 13:31   There are no new results to review at this time.  Assessment and Plan:  * Sepsis (HCC) Patient is presenting with tachycardia, leukocytosis, complicated by AKI secondary to complicated UTI and obstructive nephropathy.  I suspect her recurrent UTIs in the past may have been due to kidney stone acting as a nidus.  - Urology consulted; appreciate their recommendations - Telemetry  monitoring - S/p 2.5 L bolus - Continue maintenance fluids - Urine and blood cultures - Given multiple rounds of antibiotics recently, cefepime per pharmacy dosing  Obstructive nephropathy Large nephrolithiasis with severe hydronephrosis and AKI.  - Urology consulted; appreciate their recommendations - IV fluids - Repeat BMP in the a.m. - Hold home nephrotoxic agents  Primary hypertension - Hold home antihypertensives due to high risk for hypotension  Advance Care Planning:   Code Status: Full Code   Consults: Urology  Family Communication: No family at bedside  Severity of Illness: The appropriate patient status for this patient is INPATIENT. Inpatient status is judged to be reasonable and necessary in order to provide the required intensity of service to ensure the patient's safety. The patient's presenting symptoms, physical exam findings, and initial radiographic and laboratory data in the context of their chronic comorbidities is felt to place them at high risk for further clinical deterioration. Furthermore, it is not anticipated that the patient will be medically stable for discharge from the hospital within 2 midnights of admission.   * I certify that at the point of admission it is my clinical judgment that the patient will require inpatient hospital care spanning beyond 2 midnights from the point of admission due to high intensity of service, high risk for further deterioration and high frequency of surveillance required.*  Author: Verdene Lennert, MD 07/22/2023 5:14 PM  For on call review [email or web address].

## 2023-07-22 NOTE — Op Note (Signed)
 Date of procedure: 07/22/23  Preoperative diagnosis:  Severe left hydronephrosis Left obstructing ureteral stone Sepsis of presumed urinary source  Postoperative diagnosis:  Same as above Left pyonephrosis   Procedure: Left retrograde pyelogram Left ureteral stent placemet  Surgeon: Vanna Scotland, MD  Anesthesia: General  Complications: None  Intraoperative findings: Challenging left ureteral stent placement secondary to chronic very large obstructing stone.  Ultimately successful in stent placement.  Copious amounts of thick purulent material, shaving cream quality refluxing from left orifice upon stent placement.  EBL: Minimal  Specimens: None  Drains: 6 x 24 French double-J ureteral stent on left, 16 French Foley catheter with 30 cc balloon  Indication: Victoria Clarke is a 73 y.o. patient with chronic obstructing left ureteral stone presenting with sepsis criteria.  After reviewing the management options for treatment, she elected to proceed with the above surgical procedure(s). We have discussed the potential benefits and risks of the procedure, side effects of the proposed treatment, the likelihood of the patient achieving the goals of the procedure, and any potential problems that might occur during the procedure or recuperation. Informed consent has been obtained.  Description of procedure:  The patient was taken to the operating room and general anesthesia was induced.  The patient was placed in the dorsal lithotomy position, prepped and draped in the usual sterile fashion, and preoperative antibiotics were administered. A preoperative time-out was performed.   A 21 French cystoscope was advanced per urethra into the bladder.  Attention was turned to the left ureteral orifice.  She did have some descent of her bladder neck secondary to mild cystocele/prolapse.  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  There was some mild sediment in the urine but  the bladder itself was not particularly erythematous.  I then intubated the left UO with a 5 [place] open-ended ureteral catheter.  Contrast was injected that showed a delicate somewhat torturous ureter proximally up to the level of the stone and no contrast above this level consistent with high-grade obstruction.  I then attempted to place a sensor wire around the level of the stone unsuccessfully.  I advance the 5 [place] open-ended ureteral catheter up to the level of the proximal ureter and then exchanged the wire for an angled Glidewire which with some manipulation, ultimately was able to get around the stone presumably to the level of the kidney.  At this point, I advanced the 5 [place] open-ended ureteral catheter up to this level and remove the stent.  Upon doing so, adjacent to the stent, there was copious amount of purulent material, very thick and shaving cream type quality rapidly effluxing from the left UO.  I injected contrast in the 5 [place] open-ended ureteral catheter to confirm that this was in fact within the renal pelvic collecting system.  I then replaced the sensor wire and remove the 5 [place] open-ended ureteral catheter.  A 6 x 24 French stent was then deployed over the wire around the stone which is a little bit tight.  Upon completion, there was a full coil noted presumably at the level of the renal pelvis as well as within the bladder.  I did confirm that there was purulent material effluxing from the stent as well.  I elected to go ahead and place a 16 French Foley catheter due to the degree of purulence in order to maximize urinary decompression overnight.  She was cleaned and dried, repositioned in the supine position, reversed of anesthesia and taken  to the PACU in stable condition.  Plan: Ultimately she may need definitive management of her stone but on further review, the left kidney does appear to have significant atrophy and as such, question whether or not to proceed with simple  nephrectomy versus ureteroscopy.  Will plan for nuclear medicine study to assess function as an outpatient and discuss definitive management that point in time.  Foley catheter may be removed in the morning if her clinical status is stable or improving.  Vanna Scotland, M.D.

## 2023-07-22 NOTE — Anesthesia Procedure Notes (Signed)
 Procedure Name: LMA Insertion Date/Time: 07/22/2023 5:12 PM  Performed by: Clydia Llano, CRNAPre-anesthesia Checklist: Patient identified, Patient being monitored, Timeout performed, Emergency Drugs available and Suction available Patient Re-evaluated:Patient Re-evaluated prior to induction Oxygen Delivery Method: Circle system utilized Preoxygenation: Pre-oxygenation with 100% oxygen Induction Type: IV induction Ventilation: Mask ventilation without difficulty LMA: LMA inserted LMA Size: 4.0 Tube type: Oral Number of attempts: 1 Placement Confirmation: positive ETCO2 and breath sounds checked- equal and bilateral Tube secured with: Tape Dental Injury: Teeth and Oropharynx as per pre-operative assessment  Comments: Upper bridge loose. Patient aware of risk of damage prior to airway insertion. Bridge as per preop after LMA insertion

## 2023-07-22 NOTE — Transfer of Care (Signed)
 Immediate Anesthesia Transfer of Care Note  Patient: Victoria Clarke  Procedure(s) Performed: CYSTOSCOPY, WITH RETROGRADE PYELOGRAM AND URETERAL STENT INSERTION (Left: Ureter)  Patient Location: PACU  Anesthesia Type:General  Level of Consciousness: awake, alert , and oriented  Airway & Oxygen Therapy: Patient Spontanous Breathing  Post-op Assessment: Report given to RN and Post -op Vital signs reviewed and stable  Post vital signs: Reviewed and stable  Last Vitals:  Vitals Value Taken Time  BP 93/60 07/22/23 1738  Temp    Pulse 93 07/22/23 1745  Resp 18 07/22/23 1745  SpO2 100 % 07/22/23 1745  Vitals shown include unfiled device data.  Last Pain:  Vitals:   07/22/23 1642  TempSrc: Temporal  PainSc: 2          Complications: No notable events documented.

## 2023-07-22 NOTE — Consult Note (Signed)
 CODE SEPSIS - PHARMACY COMMUNICATION  **Broad Spectrum Antibiotics should be administered within 1 hour of Sepsis diagnosis**  Time Code Sepsis Called/Page Received: 1122  Antibiotics Ordered: ceftriaxone  Time of 1st antibiotic administration: 1142  Additional action taken by pharmacy: none  If necessary, Name of Provider/Nurse Contacted: n/a   Yazmeen Woolf Rodriguez-Guzman PharmD, BCPS 07/22/2023 11:52 AM

## 2023-07-23 ENCOUNTER — Other Ambulatory Visit: Payer: Self-pay | Admitting: Physician Assistant

## 2023-07-23 ENCOUNTER — Encounter: Payer: Self-pay | Admitting: Urology

## 2023-07-23 DIAGNOSIS — A419 Sepsis, unspecified organism: Secondary | ICD-10-CM | POA: Diagnosis not present

## 2023-07-23 DIAGNOSIS — N179 Acute kidney failure, unspecified: Secondary | ICD-10-CM | POA: Diagnosis not present

## 2023-07-23 DIAGNOSIS — N132 Hydronephrosis with renal and ureteral calculous obstruction: Secondary | ICD-10-CM

## 2023-07-23 DIAGNOSIS — N201 Calculus of ureter: Secondary | ICD-10-CM | POA: Diagnosis not present

## 2023-07-23 DIAGNOSIS — N390 Urinary tract infection, site not specified: Secondary | ICD-10-CM | POA: Diagnosis not present

## 2023-07-23 DIAGNOSIS — R6520 Severe sepsis without septic shock: Secondary | ICD-10-CM | POA: Diagnosis not present

## 2023-07-23 LAB — BASIC METABOLIC PANEL WITH GFR
Anion gap: 11 (ref 5–15)
BUN: 29 mg/dL — ABNORMAL HIGH (ref 8–23)
CO2: 21 mmol/L — ABNORMAL LOW (ref 22–32)
Calcium: 8.4 mg/dL — ABNORMAL LOW (ref 8.9–10.3)
Chloride: 103 mmol/L (ref 98–111)
Creatinine, Ser: 1.1 mg/dL — ABNORMAL HIGH (ref 0.44–1.00)
GFR, Estimated: 53 mL/min — ABNORMAL LOW (ref >60)
Glucose, Bld: 103 mg/dL — ABNORMAL HIGH (ref 70–99)
Potassium: 4.9 mmol/L (ref 3.5–5.1)
Sodium: 135 mmol/L (ref 135–145)

## 2023-07-23 LAB — CBC
HCT: 29.4 % — ABNORMAL LOW (ref 36.0–46.0)
Hemoglobin: 9.6 g/dL — ABNORMAL LOW (ref 12.0–15.0)
MCH: 27.5 pg (ref 26.0–34.0)
MCHC: 32.7 g/dL (ref 30.0–36.0)
MCV: 84.2 fL (ref 80.0–100.0)
Platelets: 276 10*3/uL (ref 150–400)
RBC: 3.49 MIL/uL — ABNORMAL LOW (ref 3.87–5.11)
RDW: 15.2 % (ref 11.5–15.5)
WBC: 11.2 10*3/uL — ABNORMAL HIGH (ref 4.0–10.5)
nRBC: 0 % (ref 0.0–0.2)

## 2023-07-23 LAB — URINE CULTURE: Culture: <10000 — AB

## 2023-07-23 MED ORDER — SODIUM CHLORIDE 0.9 % IV SOLN
2.0000 g | Freq: Two times a day (BID) | INTRAVENOUS | Status: DC
  Administered 2023-07-23 (×2): 2 g via INTRAVENOUS
  Filled 2023-07-23 (×3): qty 12.5

## 2023-07-23 MED ORDER — ENOXAPARIN SODIUM 40 MG/0.4ML IJ SOSY
40.0000 mg | PREFILLED_SYRINGE | INTRAMUSCULAR | Status: DC
  Administered 2023-07-23: 40 mg via SUBCUTANEOUS
  Filled 2023-07-23: qty 0.4

## 2023-07-23 MED ORDER — TAMSULOSIN HCL 0.4 MG PO CAPS
0.4000 mg | ORAL_CAPSULE | Freq: Every day | ORAL | Status: DC
Start: 2023-07-23 — End: 2023-07-24
  Administered 2023-07-23 – 2023-07-24 (×2): 0.4 mg via ORAL
  Filled 2023-07-23 (×2): qty 1

## 2023-07-23 MED ORDER — OXYCODONE HCL 5 MG PO TABS: 5.0000 mg | ORAL_TABLET | ORAL | Status: DC | PRN

## 2023-07-23 NOTE — Progress Notes (Signed)
 PROGRESS NOTE    Victoria Clarke  ZOX:[id number] DOB: [birth year] DOA: 07/22/2023 PCP: Lorre Munroe, NP    Brief Narrative:   From admission h and p    Assessment & Plan:   Principal Problem:   Sepsis (HCC) Active Problems:   Obstructive nephropathy   Primary hypertension  # Obstructive nephrolithiasis Large 2.3 cm obstructing left stone. POD1 from stent placement by urology - outpt f/u for definitive management - flomax for stent discomfort - d/c foley  # Urosepsis 2/2 above process. Clinically much improved. Recent cultures growing pan-sensitive e coli but is s/p multiple rounds abx. Sepsis by leukocytosis, tachycardia. Much improved - will continue cefepime pending results of urine culture  # Diarrhea Likely 2/2 uti and/or abx - check c diff and gi pathogen panel  # AKI 2/2 sepsis, much improved with fluids - d/c fluids and monitor  # Normocytic anemia Hgb 9.6 today from 12s baseline, no bleeding, suspect dilutional from fluids - monitor  # Obesity Noted  # HTN Normotensive in setting of sepsis - home amlodipine/olmesartan on hold  DVT prophylaxis: lovenox Code Status: full Family Communication: none at bedside  Level of care: Progressive Status is: Inpatient Remains inpatient appropriate because: need for IV abx    Consultants:  urology  Procedures: Cystoscopy with stent placement 3/26  Antimicrobials:  cefepime    Subjective: Feeling much improved, tolerating breakfast  Objective: Vitals:   07/22/23 2023 07/22/23 2325 07/23/23 0458 07/23/23 0817  BP: 124/61 126/62 (!) 109/57 121/60  Pulse: 96 100 87 97  Resp: 16 20 18    Temp: 98 F (36.7 C) 98.3 F (36.8 C) 97.8 F (36.6 C) 98.1 F (36.7 C)  TempSrc: Oral Oral    SpO2: 96% 96% 97% 97%  Weight: 77.2 kg     Height: 5\' 3"  (1.6 m)       Intake/Output Summary (Last 24 hours) at 07/23/2023 0910 Last data filed at 07/23/2023 0700 Gross per 24 hour  Intake 100 ml  Output 1650 ml   Net -1550 ml   Filed Weights   07/22/23 1024 07/22/23 1642 07/22/23 2023  Weight: 74.4 kg 74.4 kg 77.2 kg    Examination:  General exam: Appears calm and comfortable  Respiratory system: Clear to auscultation. Respiratory effort normal. Cardiovascular system: S1 & S2 heard, RRR. No JVD, murmurs, rubs, gallops or clicks. No pedal edema. Gastrointestinal system: Abdomen is nondistended, soft and nontender. Mild left flank tenderness Central nervous system: Alert and oriented. No focal neurological deficits. Extremities: Symmetric 5 x 5 power. Skin: No rashes, lesions or ulcers Psychiatry: Judgement and insight appear normal. Mood & affect appropriate.     Data Reviewed: I have personally reviewed following labs and imaging studies  CBC: Recent Labs  Lab 07/22/23 1004 07/23/23 0522  WBC 23.6* 11.2*  NEUTROABS 19.9*  --   HGB 12.5 9.6*  HCT 38.3 29.4*  MCV 85.9 84.2  PLT 395 276   Basic Metabolic Panel: Recent Labs  Lab 07/22/23 1004 07/23/23 0522  NA 132* 135  K 4.3 4.9  CL 96* 103  CO2 24 21*  GLUCOSE 100* 103*  BUN 36* 29*  CREATININE 1.91* 1.10*  CALCIUM 8.9 8.4*   GFR: Estimated Creatinine Clearance: 44.8 mL/min (A) (by C-G formula based on SCr of 1.1 mg/dL (H)). Liver Function Tests: Recent Labs  Lab 07/22/23 1004  AST 25  ALT 18  ALKPHOS 68  BILITOT 0.8  PROT 7.7  ALBUMIN 2.8*   No results for input(s): "  LIPASE", "AMYLASE" in the last 168 hours. No results for input(s): "AMMONIA" in the last 168 hours. Coagulation Profile: No results for input(s): "INR", "PROTIME" in the last 168 hours. Cardiac Enzymes: No results for input(s): "CKTOTAL", "CKMB", "CKMBINDEX", "TROPONINI" in the last 168 hours. BNP (last 3 results) No results for input(s): "PROBNP" in the last 8760 hours. HbA1C: No results for input(s): "HGBA1C" in the last 72 hours. CBG: No results for input(s): "GLUCAP" in the last 168 hours. Lipid Profile: No results for input(s):  "CHOL", "HDL", "LDLCALC", "TRIG", "CHOLHDL", "LDLDIRECT" in the last 72 hours. Thyroid Function Tests: No results for input(s): "TSH", "T4TOTAL", "FREET4", "T3FREE", "THYROIDAB" in the last 72 hours. Anemia Panel: No results for input(s): "VITAMINB12", "FOLATE", "FERRITIN", "TIBC", "IRON", "RETICCTPCT" in the last 72 hours. Urine analysis:    Component Value Date/Time   COLORURINE AMBER (A) 07/22/2023 1135   APPEARANCEUR CLOUDY (A) 07/22/2023 1135   APPEARANCEUR Hazy (A) 07/02/2023 1035   LABSPEC 1.014 07/22/2023 1135   PHURINE 5.0 07/22/2023 1135   GLUCOSEU NEGATIVE 07/22/2023 1135   HGBUR SMALL (A) 07/22/2023 1135   BILIRUBINUR NEGATIVE 07/22/2023 1135   BILIRUBINUR Negative 07/02/2023 1035   KETONESUR NEGATIVE 07/22/2023 1135   PROTEINUR 30 (A) 07/22/2023 1135   UROBILINOGEN 0.2 05/21/2023 1342   NITRITE NEGATIVE 07/22/2023 1135   LEUKOCYTESUR LARGE (A) 07/22/2023 1135   Sepsis Labs: @LABRCNTIP (procalcitonin:4,lacticidven:4)  ) Recent Results (from the past 240 hours)  Culture, blood (routine x 2)     Status: None (Preliminary result)   Collection Time: 07/22/23 11:36 AM   Specimen: BLOOD  Result Value Ref Range Status   Specimen Description BLOOD LEFT ARM  Final   Special Requests   Final    BOTTLES DRAWN AEROBIC AND ANAEROBIC Blood Culture results may not be optimal due to an inadequate volume of blood received in culture bottles   Culture   Final    NO GROWTH < 24 HOURS Performed at [facility], [address] [place], [place] [address]    Report Status PENDING  Incomplete  Culture, blood (routine x 2)     Status: None (Preliminary result)   Collection Time: 07/22/23 11:36 AM   Specimen: BLOOD  Result Value Ref Range Status   Specimen Description BLOOD RIGHT ARM  Final   Special Requests   Final    BOTTLES DRAWN AEROBIC AND ANAEROBIC Blood Culture results may not be optimal due to an inadequate volume of blood received in culture bottles   Culture    Final    NO GROWTH < 24 HOURS Performed at [facility], [address] [place], [place] [address]    Report Status PENDING  Incomplete         Radiology Studies: DG OR UROLOGY CYSTO IMAGE ([facility] ONLY) Result Date: 07/22/2023 There is no interpretation for this exam.  This order is for images obtained during a surgical procedure.  Please See "Surgeries" Tab for more information regarding the procedure.   CT Renal Stone Study Result Date: 07/22/2023 CLINICAL DATA:  Flank pain, fever. History of multiple urinary tract infections. EXAM: CT ABDOMEN AND PELVIS WITHOUT CONTRAST TECHNIQUE: Multidetector CT imaging of the abdomen and pelvis was performed following the standard protocol without IV contrast. RADIATION DOSE REDUCTION: This exam was performed according to the departmental dose-optimization program which includes automated exposure control, adjustment of the mA and/or kV according to patient size and/or use of iterative reconstruction technique. COMPARISON:  None Available. FINDINGS: Lower chest: No acute abnormality. Hepatobiliary:  Status post cholecystectomy. No biliary dilatation is noted. Slightly nodular hepatic contours are noted suggesting possible hepatic cirrhosis. Pancreas: Unremarkable. No pancreatic ductal dilatation or surrounding inflammatory changes. Spleen: Normal in size without focal abnormality. Adrenals/Urinary Tract: Adrenal glands appear normal. Severe left hydronephrosis is noted secondary to 2.3 cm calculus at left ureteropelvic junction. There does appear to be cortical thinning involving the left kidney suggesting longstanding obstruction. Multiple smaller calculi are noted throughout the left intrarenal collecting system. Probable small right renal cyst is noted for which no further follow-up is required. Urinary bladder is decompressed. Stomach/Bowel: Stomach is within normal limits. Appendix appears normal. No evidence of bowel wall thickening, distention,  or inflammatory changes. Vascular/Lymphatic: Aortic atherosclerosis. No enlarged abdominal or pelvic lymph nodes. Reproductive: Uterus and bilateral adnexa are unremarkable. Other: No abdominal wall hernia or abnormality. No abdominopelvic ascites. Musculoskeletal: No acute or significant osseous findings. IMPRESSION: Severe left hydronephrosis is noted secondary to 2.3 cm calculus at left ureteropelvic junction. Cortical thinning of left kidney is noted suggesting longstanding obstruction. Innumerable smaller other calculi are noted throughout the left intrarenal collecting system. Slightly nodular hepatic contours are noted suggesting possible hepatic cirrhosis. Aortic Atherosclerosis (ICD10-I70.0). Electronically Signed   By: Lupita Raider M.D.   On: 07/22/2023 13:31        Scheduled Meds:  atorvastatin  20 mg Oral Daily   sodium chloride flush  3 mL Intravenous Q12H   Continuous Infusions:  ceFEPime (MAXIPIME) IV 2 g (07/23/23 0902)   lactated ringers 150 mL/hr (07/23/23 0859)     LOS: 1 day     Silvano Bilis, MD [facility]   If 7PM-7AM, please contact night-coverage [email or web address] Password [email or web address] 07/23/2023, 9:10 AM

## 2023-07-23 NOTE — Progress Notes (Signed)
 Urology Inpatient Progress Note  Subjective: No acute events overnight. She is afebrile, VSS. WBC count down, 11.2. Creatinine down, 1.10. Urine culture pending, blood culture pending with no growth at <24 hours; on antibiotics as below. Foley catheter in place draining clear yellow urine. Today she reports bothersome bladder spasms but overall feels well.  Anti-infectives: Anti-infectives (From admission, onward)    Start     Dose/Rate Route Frequency Ordered Stop   07/23/23 0815  ceFEPIme (MAXIPIME) 2 g in sodium chloride 0.9 % 100 mL IVPB        2 g 200 mL/hr over 30 Minutes Intravenous Every 12 hours 07/23/23 0813     07/22/23 1545  ceFEPIme (MAXIPIME) 2 g in sodium chloride 0.9 % 100 mL IVPB  Status:  Discontinued        2 g 200 mL/hr over 30 Minutes Intravenous Every 24 hours 07/22/23 1537 07/23/23 0813   07/22/23 1130  cefTRIAXone (ROCEPHIN) 2 g in sodium chloride 0.9 % 100 mL IVPB        2 g 200 mL/hr over 30 Minutes Intravenous Once 07/22/23 1122 07/22/23 1309       Current Facility-Administered Medications  Medication Dose Route Frequency Provider Last Rate Last Admin   acetaminophen (TYLENOL) tablet 650 mg  650 mg Oral Q6H PRN Verdene Lennert, MD   650 mg at 07/23/23 0335   Or   acetaminophen (TYLENOL) suppository 650 mg  650 mg Rectal Q6H PRN Verdene Lennert, MD       atorvastatin (LIPITOR) tablet 20 mg  20 mg Oral Daily Verdene Lennert, MD   20 mg at 07/23/23 0857   ceFEPIme (MAXIPIME) 2 g in sodium chloride 0.9 % 100 mL IVPB  2 g Intravenous Q12H Kathrynn Running, MD 200 mL/hr at 07/23/23 0902 2 g at 07/23/23 0902   enoxaparin (LOVENOX) injection 40 mg  40 mg Subcutaneous Q24H Wouk, Wilfred Curtis, MD       HYDROmorphone (DILAUDID) injection 0.5 mg  0.5 mg Intravenous Q3H PRN Verdene Lennert, MD       ondansetron (ZOFRAN) tablet 4 mg  4 mg Oral Q6H PRN Verdene Lennert, MD       Or   ondansetron (ZOFRAN) injection 4 mg  4 mg Intravenous Q6H PRN Verdene Lennert, MD        oxyCODONE (Oxy IR/ROXICODONE) immediate release tablet 5 mg  5 mg Oral Q4H PRN Verdene Lennert, MD   5 mg at 07/23/23 0857   sodium chloride flush (NS) 0.9 % injection 3 mL  3 mL Intravenous Q12H Verdene Lennert, MD   3 mL at 07/23/23 0906   tamsulosin (FLOMAX) capsule 0.4 mg  0.4 mg Oral QPC breakfast Kathrynn Running, MD        Objective: Vital signs in last 24 hours: Temp:  [97.8 F (36.6 C)-98.7 F (37.1 C)] 98.1 F (36.7 C) (03/27 0817) Pulse Rate:  [57-119] 97 (03/27 0817) Resp:  [8-27] 18 (03/27 0458) BP: (90-126)/(54-75) 121/60 (03/27 0817) SpO2:  [93 %-100 %] 97 % (03/27 0817) Weight:  [74.4 kg-77.2 kg] 77.2 kg (03/26 2023)  Intake/Output from previous day: 03/26 0701 - 03/27 0700 In: 100 [IV Piggyback:100] Out: 1650 [Urine:1650] Intake/Output this shift: No intake/output data recorded.  Physical Exam Vitals and nursing note reviewed.  Constitutional:      General: She is not in acute distress.    Appearance: She is not ill-appearing, toxic-appearing or diaphoretic.  HENT:     Head: Normocephalic and atraumatic.  Pulmonary:  Effort: Pulmonary effort is normal. No respiratory distress.  Skin:    General: Skin is warm and dry.  Neurological:     Mental Status: She is alert and oriented to person, place, and time.  Psychiatric:        Mood and Affect: Mood normal.        Behavior: Behavior normal.    Lab Results:  Recent Labs    07/22/23 1004 07/23/23 0522  WBC 23.6* 11.2*  HGB 12.5 9.6*  HCT 38.3 29.4*  PLT 395 276   BMET Recent Labs    07/22/23 1004 07/23/23 0522  NA 132* 135  K 4.3 4.9  CL 96* 103  CO2 24 21*  GLUCOSE 100* 103*  BUN 36* 29*  CREATININE 1.91* 1.10*  CALCIUM 8.9 8.4*   Studies/Results: DG OR UROLOGY CYSTO IMAGE ([facility] ONLY) Result Date: 07/22/2023 There is no interpretation for this exam.  This order is for images obtained during a surgical procedure.  Please See "Surgeries" Tab for more information regarding the  procedure.   CT Renal Stone Study Result Date: 07/22/2023 CLINICAL DATA:  Flank pain, fever. History of multiple urinary tract infections. EXAM: CT ABDOMEN AND PELVIS WITHOUT CONTRAST TECHNIQUE: Multidetector CT imaging of the abdomen and pelvis was performed following the standard protocol without IV contrast. RADIATION DOSE REDUCTION: This exam was performed according to the departmental dose-optimization program which includes automated exposure control, adjustment of the mA and/or kV according to patient size and/or use of iterative reconstruction technique. COMPARISON:  None Available. FINDINGS: Lower chest: No acute abnormality. Hepatobiliary: Status post cholecystectomy. No biliary dilatation is noted. Slightly nodular hepatic contours are noted suggesting possible hepatic cirrhosis. Pancreas: Unremarkable. No pancreatic ductal dilatation or surrounding inflammatory changes. Spleen: Normal in size without focal abnormality. Adrenals/Urinary Tract: Adrenal glands appear normal. Severe left hydronephrosis is noted secondary to 2.3 cm calculus at left ureteropelvic junction. There does appear to be cortical thinning involving the left kidney suggesting longstanding obstruction. Multiple smaller calculi are noted throughout the left intrarenal collecting system. Probable small right renal cyst is noted for which no further follow-up is required. Urinary bladder is decompressed. Stomach/Bowel: Stomach is within normal limits. Appendix appears normal. No evidence of bowel wall thickening, distention, or inflammatory changes. Vascular/Lymphatic: Aortic atherosclerosis. No enlarged abdominal or pelvic lymph nodes. Reproductive: Uterus and bilateral adnexa are unremarkable. Other: No abdominal wall hernia or abnormality. No abdominopelvic ascites. Musculoskeletal: No acute or significant osseous findings. IMPRESSION: Severe left hydronephrosis is noted secondary to 2.3 cm calculus at left ureteropelvic junction.  Cortical thinning of left kidney is noted suggesting longstanding obstruction. Innumerable smaller other calculi are noted throughout the left intrarenal collecting system. Slightly nodular hepatic contours are noted suggesting possible hepatic cirrhosis. Aortic Atherosclerosis (ICD10-I70.0). Electronically Signed   By: Lupita Raider M.D.   On: 07/22/2023 13:31   Assessment & Plan: 73 y.o. female with PMH recurrent UTI now s/p left ureteral stent placement with Dr. Apolinar Junes for management of a chronic, obstructing left UPJ stone with urosepsis.  She is clinically improving on empiric antibiotics.  She is tolerating her stent and Foley with bothersome bladder spasms.  We discussed that her bladder spasms may improve with Foley removal.  We discussed that she will require 14 days of culture-appropriate antibiotics to treat her infection, followed by outpatient Lasix renogram and office follow-up with Dr. Apolinar Junes to discuss management options moving forward.  She understands that there may be little to no function remaining from her left kidney  to the longstanding obstruction.  Recommendations: -Continue empiric antibiotics and follow cultures for a total of 14 days of culture-appropriate therapy -Discontinue Foley catheter -Continue Flomax 0.4mg  daily and consider oxybutynin 5mg  every 8 hours as needed for stent discomfort. -Outpatient Lasix renogram with office follow-up with Dr. Apolinar Junes to discuss treatment options  Carman Ching, PA-C 07/23/2023

## 2023-07-23 NOTE — Plan of Care (Signed)

## 2023-07-24 ENCOUNTER — Ambulatory Visit

## 2023-07-24 DIAGNOSIS — A419 Sepsis, unspecified organism: Secondary | ICD-10-CM | POA: Diagnosis not present

## 2023-07-24 DIAGNOSIS — N138 Other obstructive and reflux uropathy: Secondary | ICD-10-CM | POA: Diagnosis not present

## 2023-07-24 DIAGNOSIS — I10 Essential (primary) hypertension: Secondary | ICD-10-CM | POA: Diagnosis not present

## 2023-07-24 DIAGNOSIS — N390 Urinary tract infection, site not specified: Secondary | ICD-10-CM

## 2023-07-24 LAB — CBC
HCT: 31.3 % — ABNORMAL LOW (ref 36.0–46.0)
Hemoglobin: 9.9 g/dL — ABNORMAL LOW (ref 12.0–15.0)
MCH: 27.5 pg (ref 26.0–34.0)
MCHC: 31.6 g/dL (ref 30.0–36.0)
MCV: 86.9 fL (ref 80.0–100.0)
Platelets: 305 10*3/uL (ref 150–400)
RBC: 3.6 MIL/uL — ABNORMAL LOW (ref 3.87–5.11)
RDW: 15.4 % (ref 11.5–15.5)
WBC: 13.2 10*3/uL — ABNORMAL HIGH (ref 4.0–10.5)
nRBC: 0 % (ref 0.0–0.2)

## 2023-07-24 LAB — BASIC METABOLIC PANEL WITH GFR
Anion gap: 7 (ref 5–15)
BUN: 28 mg/dL — ABNORMAL HIGH (ref 8–23)
CO2: 25 mmol/L (ref 22–32)
Calcium: 8.3 mg/dL — ABNORMAL LOW (ref 8.9–10.3)
Chloride: 104 mmol/L (ref 98–111)
Creatinine, Ser: 0.99 mg/dL (ref 0.44–1.00)
GFR, Estimated: >60 mL/min (ref >60)
Glucose, Bld: 113 mg/dL — ABNORMAL HIGH (ref 70–99)
Potassium: 4 mmol/L (ref 3.5–5.1)
Sodium: 136 mmol/L (ref 135–145)

## 2023-07-24 MED ORDER — CEFADROXIL 500 MG PO CAPS
1000.0000 mg | ORAL_CAPSULE | Freq: Two times a day (BID) | ORAL | Status: DC
  Administered 2023-07-24: 1000 mg via ORAL
  Filled 2023-07-24: qty 2

## 2023-07-24 MED ORDER — TAMSULOSIN HCL 0.4 MG PO CAPS: 0.4000 mg | ORAL_CAPSULE | Freq: Every day | ORAL | 0 refills | Status: DC

## 2023-07-24 MED ORDER — CEFADROXIL 500 MG PO CAPS
500.0000 mg | ORAL_CAPSULE | Freq: Two times a day (BID) | ORAL | Status: DC
  Filled 2023-07-24: qty 1

## 2023-07-24 MED ORDER — CEFADROXIL 500 MG PO CAPS: 1000.0000 mg | ORAL_CAPSULE | Freq: Two times a day (BID) | ORAL | 0 refills | Status: AC

## 2023-07-24 NOTE — Discharge Instructions (Signed)
 Pt to get Lasix renogram on 07/30/2023

## 2023-07-24 NOTE — Discharge Summary (Signed)
 Physician Discharge Summary   Patient: Victoria Clarke MRN: [id number] DOB: May 31, 1950  Admit date:     07/22/2023  Discharge date: 07/24/23  Discharge Physician: Enedina Finner   PCP: Lorre Munroe, NP   Recommendations at discharge:   patient will follow-up April 3 for Lasix renogram per urology recommendation follow-up urology Dr. Apolinar Junes on May 14.  Discharge Diagnoses: Principal Problem:   Sepsis (HCC) Active Problems:   Obstructive nephropathy   Primary hypertension   Class 1 obesity due to excess calories with body mass index (BMI) of 30.0 to 30.9 in adult   Complicated UTI (urinary tract infection)   Victoria Clarke is a 73 y.o. female with medical history significant of Recurrent UTI, recurrent nephrolithiasis, hypertension, hyperlipidemia, osteoporosis, who presents to the ED due to nausea vomiting.  Pt states that she has been suffering with recurrent UTIs for quite some time now, but she feels like this 1 started approximately 3 weeks to 1 month ago.    CT renal stone study with severe left hydronephrosis with 2.3 cm calculus.   Urology was consulted  Obstructive nephrolithiasis left --Large 2.3 cm obstructing left stone. POD2 from stent placement by urology - outpt f/u for definitive management - flomax for stent discomfort - d/ced foley -- patient scheduled to get Lasix renogram on 07/30/23-- and thereby follow-up results with Dr. Apolinar Junes as outpatient.   Urosepsis --2/2 above process. Clinically much improved. -- Recent cultures growing pan-sensitive e coli but is s/p multiple rounds abx. Sepsis by leukocytosis, tachycardia. Much improved -urine culture this admission insignificant growth. Will switch to oral cephalosporin for total of 14 days per urology recommendation    Diarrhea Likely 2/2 uti and/or abx --resolved--unable to obtain sample   AKI 2/2 sepsis, much improved with fluids - d/c fluids and monitor-- resolved    HTN Normotensive in setting of  sepsis - home amlodipine/olmesartan resumed at discharge  Overall doing well. Will discharge to home. Patient agreeable   DVT prophylaxis: lovenox Code Status: full Family Communication: none at bedside     Consultants: urology Procedures performed: Left retrograde Pyelogram, left ureteral stent placement  Disposition: Home Diet recommendation:  Discharge Diet Orders (From admission, onward)     Start     Ordered   07/24/23 0000  Diet - low sodium heart healthy        07/24/23 0952           Cardiac diet DISCHARGE MEDICATION: Allergies as of 07/24/2023   No Known Allergies      Medication List     TAKE these medications    amLODipine-olmesartan 10-40 MG tablet Commonly known as: AZOR TAKE 1 TABLET DAILY   atorvastatin 20 MG tablet Commonly known as: LIPITOR TAKE 1 TABLET DAILY   cefadroxil 500 MG capsule Commonly known as: DURICEF Take 2 capsules (1,000 mg total) by mouth 2 (two) times daily for 12 days.   tamsulosin 0.4 MG Caps capsule Commonly known as: FLOMAX Take 1 capsule (0.4 mg total) by mouth daily after breakfast. Start taking on: July 25, 2023        Follow-up Information     Lorre Munroe, NP. Schedule an appointment as soon as possible for a visit in 1 week(s).   Specialties: Internal Medicine, Emergency Medicine Why: Hospital Follow-Up appointment: 07-27-2023 11:20 AM. Arrive 15 minutes early. Bring your insurance card and photo ID. Contact information: [address] [place] [address] [phone]         Vanna Scotland,  MD Follow up on 09/09/2023.   Specialty: Urology Why: Hospital Follow-Up appointment: please have patient book appointment office didnt answer. Contact information: [address] [place] [address] [phone]                Discharge Exam: Ceasar Mons Weights   07/22/23 1024 07/22/23 1642 07/22/23 2023  Weight: 74.4 kg 74.4 kg 77.2 kg   Alert and oriented times  three respiratory clear to auscultation cardiovascular both heart sounds normal no murmur abdomen soft benign nontender neuro- grossly intact  Condition at discharge: fair  The results of significant diagnostics from this hospitalization (including imaging, microbiology, ancillary and laboratory) are listed below for reference.   Imaging Studies: DG OR UROLOGY CYSTO IMAGE ([facility] ONLY) Result Date: 07/22/2023 There is no interpretation for this exam.  This order is for images obtained during a surgical procedure.  Please See "Surgeries" Tab for more information regarding the procedure.   CT Renal Stone Study Result Date: 07/22/2023 CLINICAL DATA:  Flank pain, fever. History of multiple urinary tract infections. EXAM: CT ABDOMEN AND PELVIS WITHOUT CONTRAST TECHNIQUE: Multidetector CT imaging of the abdomen and pelvis was performed following the standard protocol without IV contrast. RADIATION DOSE REDUCTION: This exam was performed according to the departmental dose-optimization program which includes automated exposure control, adjustment of the mA and/or kV according to patient size and/or use of iterative reconstruction technique. COMPARISON:  None Available. FINDINGS: Lower chest: No acute abnormality. Hepatobiliary: Status post cholecystectomy. No biliary dilatation is noted. Slightly nodular hepatic contours are noted suggesting possible hepatic cirrhosis. Pancreas: Unremarkable. No pancreatic ductal dilatation or surrounding inflammatory changes. Spleen: Normal in size without focal abnormality. Adrenals/Urinary Tract: Adrenal glands appear normal. Severe left hydronephrosis is noted secondary to 2.3 cm calculus at left ureteropelvic junction. There does appear to be cortical thinning involving the left kidney suggesting longstanding obstruction. Multiple smaller calculi are noted throughout the left intrarenal collecting system. Probable small right renal cyst is noted for which no further  follow-up is required. Urinary bladder is decompressed. Stomach/Bowel: Stomach is within normal limits. Appendix appears normal. No evidence of bowel wall thickening, distention, or inflammatory changes. Vascular/Lymphatic: Aortic atherosclerosis. No enlarged abdominal or pelvic lymph nodes. Reproductive: Uterus and bilateral adnexa are unremarkable. Other: No abdominal wall hernia or abnormality. No abdominopelvic ascites. Musculoskeletal: No acute or significant osseous findings. IMPRESSION: Severe left hydronephrosis is noted secondary to 2.3 cm calculus at left ureteropelvic junction. Cortical thinning of left kidney is noted suggesting longstanding obstruction. Innumerable smaller other calculi are noted throughout the left intrarenal collecting system. Slightly nodular hepatic contours are noted suggesting possible hepatic cirrhosis. Aortic Atherosclerosis (ICD10-I70.0). Electronically Signed   By: Lupita Raider M.D.   On: 07/22/2023 13:31   Ultrasound renal complete Result Date: 07/16/2023 CLINICAL DATA:  Recurrent UTIs EXAM: RENAL / URINARY TRACT ULTRASOUND COMPLETE COMPARISON:  None Available. FINDINGS: Right Kidney: Renal measurements: 11 x 4.3 x 3.9 cm = volume: 97.4 mL. Simple cyst mid upper pole medially 1.1 x 1.2 cm mild fullness of the renal pelvis Left Kidney: Renal measurements: 11.5 x 4.1 x 4.2 cm = volume: 103.3 mL. Simple cyst in the upper pole left kidney measuring 1.3 x 1.5 cm and 1.3 x 1.3 cm. Large calcification in the mid lower pole region of the kidney measures about 16 mm consistent with renal stone. Without significant hydronephrosis. Bladder: Appears normal for degree of bladder distention. Other: None. IMPRESSION: *Bilateral renal cysts. *Left renal stone.  Left nephrolithiasis, no  hydronephrosis *Mild fullness of the right renal pelvis. Electronically Signed   By: Shaaron Adler M.D.   On: 07/16/2023 22:00    Microbiology: Results for orders placed or performed during the  hospital encounter of 07/22/23  Urine Culture     Status: Abnormal   Collection Time: 07/22/23 11:35 AM   Specimen: Urine, Clean Catch  Result Value Ref Range Status   Specimen Description   Final    URINE, CLEAN CATCH Performed at [facility], [address], [place], [place] [address]    Special Requests   Final    NONE Performed at [facility], [address], [place], [place] [address]    Culture (A)  Final    <10,000 COLONIES/mL INSIGNIFICANT GROWTH Performed at [facility], [address], [place], [place] [address]    Report Status 07/23/2023 FINAL  Final  Culture, blood (routine x 2)     Status: None (Preliminary result)   Collection Time: 07/22/23 11:36 AM   Specimen: BLOOD  Result Value Ref Range Status   Specimen Description BLOOD LEFT ARM  Final   Special Requests   Final    BOTTLES DRAWN AEROBIC AND ANAEROBIC Blood Culture results may not be optimal due to an inadequate volume of blood received in culture bottles   Culture   Final    NO GROWTH 2 DAYS Performed at [facility] [place], [address] [place], [place] [address]    Report Status PENDING  Incomplete  Culture, blood (routine x 2)     Status: None (Preliminary result)   Collection Time: 07/22/23 11:36 AM   Specimen: BLOOD  Result Value Ref Range Status   Specimen Description BLOOD RIGHT ARM  Final   Special Requests   Final    BOTTLES DRAWN AEROBIC AND ANAEROBIC Blood Culture results may not be optimal due to an inadequate volume of blood received in culture bottles   Culture   Final    NO GROWTH 2 DAYS Performed at [facility], [address] [place], [place] [address]    Report Status PENDING  Incomplete    Labs: CBC: Recent Labs  Lab 07/22/23 1004 07/23/23 0522 07/24/23 0434  WBC 23.6* 11.2* 13.2*  NEUTROABS 19.9*  --   --   HGB 12.5 9.6* 9.9*  HCT 38.3 29.4* 31.3*  MCV 85.9 84.2 86.9  PLT 395 276 305   Basic Metabolic Panel: Recent Labs   Lab 07/22/23 1004 07/23/23 0522 07/24/23 0434  NA 132* 135 136  K 4.3 4.9 4.0  CL 96* 103 104  CO2 24 21* 25  GLUCOSE 100* 103* 113*  BUN 36* 29* 28*  CREATININE 1.91* 1.10* 0.99  CALCIUM 8.9 8.4* 8.3*   Liver Function Tests: Recent Labs  Lab 07/22/23 1004  AST 25  ALT 18  ALKPHOS 68  BILITOT 0.8  PROT 7.7  ALBUMIN 2.8*    Discharge time spent: greater than 30 minutes.  Signed: Enedina Finner, MD [facility] 07/24/2023

## 2023-07-27 ENCOUNTER — Encounter: Payer: Self-pay | Admitting: Internal Medicine

## 2023-07-27 ENCOUNTER — Ambulatory Visit (INDEPENDENT_AMBULATORY_CARE_PROVIDER_SITE_OTHER): Admitting: Internal Medicine

## 2023-07-27 VITALS — BP 118/62 | Ht 63.0 in

## 2023-07-27 DIAGNOSIS — Z960 Presence of urogenital implants: Secondary | ICD-10-CM

## 2023-07-27 DIAGNOSIS — R6521 Severe sepsis with septic shock: Secondary | ICD-10-CM

## 2023-07-27 DIAGNOSIS — N132 Hydronephrosis with renal and ureteral calculous obstruction: Secondary | ICD-10-CM | POA: Diagnosis not present

## 2023-07-27 DIAGNOSIS — A4151 Sepsis due to Escherichia coli [E. coli]: Secondary | ICD-10-CM

## 2023-07-27 DIAGNOSIS — N179 Acute kidney failure, unspecified: Secondary | ICD-10-CM

## 2023-07-27 LAB — CULTURE, BLOOD (ROUTINE X 2)
Culture: NO GROWTH
Culture: NO GROWTH

## 2023-07-27 NOTE — Patient Instructions (Signed)
 Hydronephrosis  Hydronephrosis is the swelling of one or both kidneys due to a blockage that stops urine from flowing out of the body. Kidneys filter waste from the blood and produce urine. This condition can lead to kidney failure and may become life-threatening if not treated promptly. What are the causes? In infants and children, common causes include problems that occur when a baby is developing in the womb. These can include problems in the kidneys or in the tubes that drain urine into the bladder (ureters). In adults, common causes include: Kidney stones. Pregnancy. A tumor or cyst in the abdomen or pelvis. An enlarged prostate gland. Other causes include: Bladder infection. Scar tissue from a previous surgery or injury. A blood clot. Cancer of the prostate, bladder, uterus, ovary, or colon. What are the signs or symptoms? Symptoms of this condition include: Pain or discomfort in your side (flank) or abdomen. Swelling in your abdomen. Nausea and vomiting. Fever. Pain when passing urine. Feelings of urgency when you need to urinate. Urinating more often than normal. In some cases, you may not have any symptoms. How is this diagnosed? This condition may be diagnosed based on: Your symptoms and medical history. A physical exam. Blood and urine tests. Imaging tests, such as an ultrasound, CT scan, or MRI. A procedure to look at your urinary tract and bladder by inserting a scope into the urethra (cystoscopy). How is this treated? Treatment for this condition depends on where the blockage is, how long it has been there, and what caused it. The goal of treatment is to remove the blockage. Treatment may include: Antibiotic medicines to treat or prevent infection. A procedure to place a small, thin tube (stent) into a blocked ureter. The stent will keep the ureter open so that urine can drain through it. A nonsurgical procedure that crushes kidney stones with shock waves  (extracorporeal shock wave lithotripsy). If kidney failure occurs, treatment may include dialysis or a kidney transplant. Follow these instructions at home:  Take over-the-counter and prescription medicines only as told by your health care provider. If you were prescribed an antibiotic medicine, take it exactly as told by your health care provider. Do not stop taking the antibiotic even if you start to feel better. Rest and return to your normal activities as told by your health care provider. Ask your health care provider what activities are safe for you. Drink enough fluid to keep your urine pale yellow. Keep all follow-up visits. This is important. Contact a health care provider if: You continue to have symptoms after treatment. You develop new symptoms. Your urine becomes cloudy or bloody. You have a fever. Get help right away if: You have severe flank or abdominal pain. You cannot drink fluids without vomiting. Summary Hydronephrosis is the swelling of one or both kidneys due to a blockage that stops urine from flowing out of the body. Hydronephrosis can lead to kidney failure and may become life-threatening if not treated promptly. The goal of treatment is to remove the blockage. It may include a procedure to insert a stent into a blocked ureter, a procedure to break up kidney stones, or taking antibiotic medicines. Follow your health care provider's instructions for taking care of yourself at home, including instructions about drinking fluids, taking medicines, and limiting activities. This information is not intended to replace advice given to you by your health care provider. Make sure you discuss any questions you have with your health care provider. Document Revised: 01/13/2023 Document Reviewed: 01/13/2023 Elsevier  Patient Education  2024 [employer].

## 2023-07-27 NOTE — Progress Notes (Signed)
 Subjective:    Patient ID: Victoria Clarke, female    DOB: 17-Jun-1950, 73 y.o.   MRN: [id number]  HPI  Patient presents the clinic today for hospital follow-up.  She presented to the ER 3/26 with complaint of nausea, vomiting and diarrhea.  CBC showed elevated WBC count of 23.6.  Metabolic panel showed elevated creatinine of 1.91 with a GFR of 27.  Urinalysis was concerning for infection.  Urine culture grew E. coli.  CT renal stone study showed:  IMPRESSION: Severe left hydronephrosis is noted secondary to 2.3 cm calculus at left ureteropelvic junction. Cortical thinning of left kidney is noted suggesting longstanding obstruction. Innumerable smaller other calculi are noted throughout the left intrarenal collecting system. Slightly nodular hepatic contours are noted suggesting possible hepatic cirrhosis. Aortic Atherosclerosis .  Urology was consulted.  She was started on IV antibiotics.  She underwent stent placement.  She was started on flomax for stent discomfort.  Her antibiotics were changed to oral cefadroxil.  She was discharged 3/28, advised to follow-up with urology and plan to have a lasix renogram on 4/1.  Since discharge, she is currently not having any symptoms. She is taking her antibiotic as prescribed, keifer and eating yogurt daily. She is taking flomax as prescribed. She denies fever, chills, nausea, vomiting, diarrhea, urinary urgency, frequency. She has occassional dysuria but denies flank pain.   Review of Systems   Past Medical History:  Diagnosis Date  . Colon polyp   . High cholesterol   . Hypertension     Current Outpatient Medications  Medication Sig Dispense Refill  . amLODipine-olmesartan (AZOR) 10-40 MG tablet TAKE 1 TABLET DAILY 90 tablet 2  . atorvastatin (LIPITOR) 20 MG tablet TAKE 1 TABLET DAILY 90 tablet 2  . cefadroxil (DURICEF) 500 MG capsule Take 2 capsules (1,000 mg total) by mouth 2 (two) times daily for 12 days. 48 capsule 0  . tamsulosin (FLOMAX)  0.4 MG CAPS capsule Take 1 capsule (0.4 mg total) by mouth daily after breakfast. 30 capsule 0   No current facility-administered medications for this visit.    No Known Allergies  Family History  Problem Relation Age of Onset  . Breast cancer Mother 1       Low grade DCIS  . Dementia Mother   . Dementia Father   . Colon polyps Father   . Breast cancer Sister 67  . Healthy Sister   . Cancer Maternal Aunt        breast/great Aunt    Social History   Socioeconomic History  . Marital status: Married    Spouse name: Not on file  . Number of children: Not on file  . Years of education: Not on file  . Highest education level: Not on file  Occupational History  . Not on file  Tobacco Use  . Smoking status: Former    Current packs/day: 0.00    Average packs/day: 1 pack/day for 30.0 years (30.0 ttl pk-yrs)    Types: Cigarettes    Start date: 04/28/1969    Quit date: 04/29/1999    Years since quitting: 24.2  . Smokeless tobacco: Never  Vaping Use  . Vaping status: Never Used  Substance and Sexual Activity  . Alcohol use: No    Alcohol/week: 0.0 standard drinks of alcohol  . Drug use: No  . Sexual activity: Not on file  Other Topics Concern  . Not on file  Social History Narrative  . Not on file   Social Drivers  of Health   Financial Resource Strain: Low Risk  (12/04/2022)   Overall Financial Resource Strain (CARDIA)   . Difficulty of Paying Living Expenses: Not hard at all  Food Insecurity: No Food Insecurity (07/23/2023)   Hunger Vital Sign   . Worried About Programme researcher, broadcasting/film/video in the Last Year: Never true   . Ran Out of Food in the Last Year: Never true  Transportation Needs: No Transportation Needs (07/23/2023)   PRAPARE - Transportation   . Lack of Transportation (Medical): No   . Lack of Transportation (Non-Medical): No  Physical Activity: Insufficiently Active (12/04/2022)   Exercise Vital Sign   . Days of Exercise per Week: 3 days   . Minutes of Exercise per  Session: 30 min  Stress: No Stress Concern Present (12/04/2022)   [employer] of Occupational Health - Occupational Stress Questionnaire   . Feeling of Stress : Only a little  Social Connections: Moderately Isolated (07/23/2023)   Social Connection and Isolation Panel [NHANES]   . Frequency of Communication with Friends and Family: More than three times a week   . Frequency of Social Gatherings with Friends and Family: Three times a week   . Attends Religious Services: Never   . Active Member of Clubs or Organizations: No   . Attends Banker Meetings: Never   . Marital Status: Married  Catering manager Violence: Not At Risk (07/23/2023)   Humiliation, Afraid, Rape, and Kick questionnaire   . Fear of Current or Ex-Partner: No   . Emotionally Abused: No   . Physically Abused: No   . Sexually Abused: No     Constitutional: Denies fever, malaise, fatigue, headache or abrupt weight changes.  HEENT: Denies eye pain, eye redness, ear pain, ringing in the ears, wax buildup, runny nose, nasal congestion, bloody nose, or sore throat. Respiratory: Denies difficulty breathing, shortness of breath, cough or sputum production.   Cardiovascular: Denies chest pain, chest tightness, palpitations or swelling in the hands or feet.  Gastrointestinal: Denies abdominal pain, bloating, constipation, diarrhea or blood in the stool.  GU: Pt reports dysuria. Denies urgency, frequency, burning sensation, blood in urine, odor or discharge. Musculoskeletal: Denies decrease in range of motion, difficulty with gait, muscle pain or joint pain and swelling.  Skin: Denies redness, rashes, lesions or ulcercations.  Neurological: Denies dizziness, difficulty with memory, difficulty with speech or problems with balance and coordination.  Psych: Denies anxiety, depression, SI/HI.  No other specific complaints in a complete review of systems (except as listed in HPI above).      Objective:   Physical  Exam  BP 118/62 (BP Location: Left Arm, Patient Position: Sitting, Cuff Size: Normal)   Ht 5\' 3"  (1.6 m)   BMI 30.15 kg/m   Wt Readings from Last 3 Encounters:  07/22/23 170 lb 3.1 oz (77.2 kg)  07/02/23 172 lb (78 kg)  06/22/23 173 lb 6.4 oz (78.7 kg)    General: Appears her stated age, obese, in NAD. Skin: Warm, dry and intact. Bruising noted to bilateral antecubital spaces. Cardiovascular: Normal rate and rhythm.  Pulmonary/Chest: Normal effort and positive vesicular breath sounds. No respiratory distress. No wheezes, rales or ronchi noted.  Abdomen: Soft and nontender. No CVA tenderness noted. Musculoskeletal:  No difficulty with gait.  Neurological: Alert and oriented.    BMET    Component Value Date/Time   NA 136 07/24/2023 0434   K 4.0 07/24/2023 0434   CL 104 07/24/2023 0434   CO2 25 07/24/2023  0434   GLUCOSE 113 (H) 07/24/2023 0434   BUN 28 (H) 07/24/2023 0434   CREATININE 0.99 07/24/2023 0434   CREATININE 1.13 (H) 06/22/2023 0826   CALCIUM 8.3 (L) 07/24/2023 0434   GFRNONAA >60 07/24/2023 0434    Lipid Panel     Component Value Date/Time   CHOL 125 06/22/2023 0826   TRIG 198 (H) 06/22/2023 0826   HDL 34 (L) 06/22/2023 0826   CHOLHDL 3.7 06/22/2023 0826   LDLCALC 64 06/22/2023 0826    CBC    Component Value Date/Time   WBC 13.2 (H) 07/24/2023 0434   RBC 3.60 (L) 07/24/2023 0434   HGB 9.9 (L) 07/24/2023 0434   HCT 31.3 (L) 07/24/2023 0434   PLT 305 07/24/2023 0434   MCV 86.9 07/24/2023 0434   MCH 27.5 07/24/2023 0434   MCHC 31.6 07/24/2023 0434   RDW 15.4 07/24/2023 0434   LYMPHSABS 2.4 07/22/2023 1004   MONOABS 1.1 (H) 07/22/2023 1004   EOSABS 0.0 07/22/2023 1004   BASOSABS 0.1 07/22/2023 1004    Hgb A1C Lab Results  Component Value Date   HGBA1C 5.4 06/22/2023            Assessment & Plan:  Hospital follow-up for urosepsis secondary to obstructive kidney stone with hydronephrosis, AKI:  Hospital notes, labs and imaging  reviewed She will follow-up with urology as an outpatient She will continue the antibiotics and flomax as previously prescribed.   RTC in 5 months for annual exam

## 2023-07-28 ENCOUNTER — Ambulatory Visit
Admission: RE | Admit: 2023-07-28 | Discharge: 2023-07-28 | Disposition: A | Discharge status: Home / Self Care | Source: Ambulatory Visit | Attending: Physician Assistant | Admitting: Physician Assistant

## 2023-07-28 DIAGNOSIS — N201 Calculus of ureter: Secondary | ICD-10-CM | POA: Insufficient documentation

## 2023-07-28 DIAGNOSIS — N132 Hydronephrosis with renal and ureteral calculous obstruction: Secondary | ICD-10-CM | POA: Diagnosis present

## 2023-07-28 MED ORDER — FUROSEMIDE 10 MG/ML IJ SOLN
39.0000 mg | Freq: Once | INTRAMUSCULAR | Status: DC
  Filled 2023-07-28: qty 3.9

## 2023-07-28 MED ORDER — TECHNETIUM TC 99M MERTIATIDE
5.0000 | Freq: Once | INTRAVENOUS | Status: AC | PRN
  Administered 2023-07-28: 5.37 via INTRAVENOUS

## 2023-07-29 ENCOUNTER — Ambulatory Visit: Admitting: Urology

## 2023-07-30 ENCOUNTER — Other Ambulatory Visit

## 2023-08-06 ENCOUNTER — Other Ambulatory Visit: Payer: Self-pay

## 2023-08-06 ENCOUNTER — Ambulatory Visit (INDEPENDENT_AMBULATORY_CARE_PROVIDER_SITE_OTHER): Admitting: Urology

## 2023-08-06 VITALS — BP 102/58 | HR 103 | Ht 64.0 in | Wt 168.4 lb

## 2023-08-06 DIAGNOSIS — N390 Urinary tract infection, site not specified: Secondary | ICD-10-CM | POA: Diagnosis not present

## 2023-08-06 DIAGNOSIS — N132 Hydronephrosis with renal and ureteral calculous obstruction: Secondary | ICD-10-CM | POA: Diagnosis not present

## 2023-08-06 DIAGNOSIS — N2889 Other specified disorders of kidney and ureter: Secondary | ICD-10-CM

## 2023-08-06 DIAGNOSIS — N1330 Unspecified hydronephrosis: Secondary | ICD-10-CM

## 2023-08-06 DIAGNOSIS — N261 Atrophy of kidney (terminal): Secondary | ICD-10-CM

## 2023-08-06 DIAGNOSIS — N135 Crossing vessel and stricture of ureter without hydronephrosis: Secondary | ICD-10-CM

## 2023-08-06 NOTE — Progress Notes (Signed)
 Marcelle Overlie Plume,acting as a scribe for Vanna Scotland, MD.,have documented all relevant documentation on the behalf of Vanna Scotland, MD,as directed by  Vanna Scotland, MD while in the presence of Vanna Scotland, MD.  08/06/23 10:58 AM   Victoria Clarke Feb 28, 1951 [id number]  Referring provider: Lorre Munroe, NP [address] [place],  [place] [address]  Chief Complaint  Patient presents with   Follow-up    HPI: 73 year old female with a personal history of severe left hydroureteronephrosis secondary to obstructing stones, initially presented with sepsis with obstruction. She underwent left ureteral stent placement, which drained a significant amount of thick purulent material. With stent placement, her creatinine trended downwards.   Despite a positive urinalysis, her urine culture was negative, although she had previously grown E. coli pan-sensitive.   In the interim, a Lasix renal study completed on 07-29-2023 demonstrated minimal function on the left side, only 6%, with significantly delayed uptake.    PMH: Past Medical History:  Diagnosis Date   Colon polyp    High cholesterol    Hypertension     Surgical History: Past Surgical History:  Procedure Laterality Date   BREAST BIOPSY Left 04-26-14   ductal hyperplasia, 1 mm papilloma.   BREAST SURGERY Right 10/25/2009   milk duct excision in [place], atypical duct epithelial proliferation l   CESAREAN SECTION  1981   CHOLECYSTECTOMY  2006   COLONOSCOPY  2016   Dr Mechele Collin   COLONOSCOPY WITH PROPOFOL N/A 07/30/2022   Procedure: COLONOSCOPY WITH PROPOFOL;  Surgeon: Wyline Mood, MD;  Location: [facility] ENDOSCOPY;  Service: Gastroenterology;  Laterality: N/A;   CYSTOSCOPY W/ URETERAL STENT PLACEMENT Left 07/22/2023   Procedure: CYSTOSCOPY, WITH RETROGRADE PYELOGRAM AND URETERAL STENT INSERTION;  Surgeon: Vanna Scotland, MD;  Location: [facility] ORS;  Service: Urology;  Laterality: Left;   EXCISION / BIOPSY BREAST / NIPPLE / DUCT Right  2011   duct excision    Home Medications:  Allergies as of 08/06/2023   No Known Allergies      Medication List        Accurate as of August 06, 2023 10:58 AM. If you have any questions, ask your nurse or doctor.          amLODipine-olmesartan 10-40 MG tablet Commonly known as: AZOR TAKE 1 TABLET DAILY   atorvastatin 20 MG tablet Commonly known as: LIPITOR TAKE 1 TABLET DAILY   tamsulosin 0.4 MG Caps capsule Commonly known as: FLOMAX Take 1 capsule (0.4 mg total) by mouth daily after breakfast.        Family History: Family History  Problem Relation Age of Onset   Breast cancer Mother 71       Low grade DCIS   Dementia Mother    Dementia Father    Colon polyps Father    Breast cancer Sister 73   Healthy Sister    Cancer Maternal Aunt        breast/great Aunt    Social History:  reports that she quit smoking about 24 years ago. Her smoking use included cigarettes. She started smoking about 54 years ago. She has a 30 pack-year smoking history. She has never used smokeless tobacco. She reports that she does not drink alcohol and does not use drugs.   Physical Exam: BP (!) 102/58   Pulse (!) 103   Ht 5\' 4"  (1.626 m)   Wt 168 lb 6 oz (76.4 kg)   BMI 28.90 kg/m   Constitutional:  Alert and oriented, No  acute distress. HEENT: [place] AT, moist mucus membranes.  Trachea midline, no masses. Neurologic: Grossly intact, no focal deficits, moving all 4 extremities. Psychiatric: Normal mood and affect.  This is renal study was personally reviewed.  Agree with radiologic interpretation.  Assessment & Plan:    1. Severe left hydronephrosis secondary to obstructing stones - She has undergone a Lasix renal scan showing only 6% function in the left kidney, indicating that the kidney is not salvageable. - The plan is to proceed with a left nephrectomy.  - The procedure will be performed laparoscopically with hand assistance, minimizing recovery time and surgical risks.   - She has been informed of the potential risks, including bleeding, infection, and other surgical complications.  - Pre-operative urine culture will be obtained to rule out infection.  - The stent will be removed during the nephrectomy.  2. History of recurrent urinary tract infections - She completed a course of antibiotics recently.  - She is advised to report any signs of infection immediately.  - A pre-operative urine culture will be considered if symptoms arise.  - She is encouraged to maintain hydration and monitor for symptoms of infection.  Return for left nephrectomy.  I have reviewed the above documentation for accuracy and completeness, and I agree with the above.   Vanna Scotland, MD   [facility] [address], [address] [place], [place] [address] [phone]

## 2023-08-06 NOTE — Progress Notes (Signed)
 Surgical Physician Order Form [facility]  Dr. Vanna Scotland, MD  * Scheduling expectation : Next Available  *Length of Case:   *Clearance needed: no  *Anticoagulation Instructions: Hold all anticoagulants  *Aspirin Instructions: Hold Aspirin  *Post-op visit Date/Instructions:  1 month follow up  *Diagnosis: Left renal atrophy/hydronephrosis/obstructing stones  *Procedure: left laparoscopic hand-assisted simple nephrectomy, left ureteral stent removal   Additional orders: N/A  -Admit type: INpatient  -Anesthesia: General  -VTE Prophylaxis Standing Order SCD's       Other:   -Standing Lab Orders Per Anesthesia    Lab other: UA/urine culture, CBC, BMP, INR, type and screen  -Standing Test orders EKG/Chest x-ray per Anesthesia       Test other:   - Medications:  Ancef 2gm IV  -Other orders:  N/A

## 2023-08-06 NOTE — H&P (View-Only) (Signed)
 Marcelle Overlie Plume,acting as a scribe for Vanna Scotland, MD.,have documented all relevant documentation on the behalf of Vanna Scotland, MD,as directed by  Vanna Scotland, MD while in the presence of Vanna Scotland, MD.  08/06/23 10:58 AM   Victoria Clarke Feb 28, 1951 [id number]  Referring provider: Lorre Munroe, NP [address] [place],  [place] [address]  Chief Complaint  Patient presents with   Follow-up    HPI: 73 year old female with a personal history of severe left hydroureteronephrosis secondary to obstructing stones, initially presented with sepsis with obstruction. She underwent left ureteral stent placement, which drained a significant amount of thick purulent material. With stent placement, her creatinine trended downwards.   Despite a positive urinalysis, her urine culture was negative, although she had previously grown E. coli pan-sensitive.   In the interim, a Lasix renal study completed on 07-29-2023 demonstrated minimal function on the left side, only 6%, with significantly delayed uptake.    PMH: Past Medical History:  Diagnosis Date   Colon polyp    High cholesterol    Hypertension     Surgical History: Past Surgical History:  Procedure Laterality Date   BREAST BIOPSY Left 04-26-14   ductal hyperplasia, 1 mm papilloma.   BREAST SURGERY Right 10/25/2009   milk duct excision in [place], atypical duct epithelial proliferation l   CESAREAN SECTION  1981   CHOLECYSTECTOMY  2006   COLONOSCOPY  2016   Dr Mechele Collin   COLONOSCOPY WITH PROPOFOL N/A 07/30/2022   Procedure: COLONOSCOPY WITH PROPOFOL;  Surgeon: Wyline Mood, MD;  Location: [facility] ENDOSCOPY;  Service: Gastroenterology;  Laterality: N/A;   CYSTOSCOPY W/ URETERAL STENT PLACEMENT Left 07/22/2023   Procedure: CYSTOSCOPY, WITH RETROGRADE PYELOGRAM AND URETERAL STENT INSERTION;  Surgeon: Vanna Scotland, MD;  Location: [facility] ORS;  Service: Urology;  Laterality: Left;   EXCISION / BIOPSY BREAST / NIPPLE / DUCT Right  2011   duct excision    Home Medications:  Allergies as of 08/06/2023   No Known Allergies      Medication List        Accurate as of August 06, 2023 10:58 AM. If you have any questions, ask your nurse or doctor.          amLODipine-olmesartan 10-40 MG tablet Commonly known as: AZOR TAKE 1 TABLET DAILY   atorvastatin 20 MG tablet Commonly known as: LIPITOR TAKE 1 TABLET DAILY   tamsulosin 0.4 MG Caps capsule Commonly known as: FLOMAX Take 1 capsule (0.4 mg total) by mouth daily after breakfast.        Family History: Family History  Problem Relation Age of Onset   Breast cancer Mother 71       Low grade DCIS   Dementia Mother    Dementia Father    Colon polyps Father    Breast cancer Sister 73   Healthy Sister    Cancer Maternal Aunt        breast/great Aunt    Social History:  reports that she quit smoking about 24 years ago. Her smoking use included cigarettes. She started smoking about 54 years ago. She has a 30 pack-year smoking history. She has never used smokeless tobacco. She reports that she does not drink alcohol and does not use drugs.   Physical Exam: BP (!) 102/58   Pulse (!) 103   Ht 5\' 4"  (1.626 m)   Wt 168 lb 6 oz (76.4 kg)   BMI 28.90 kg/m   Constitutional:  Alert and oriented, No  acute distress. HEENT: [place] AT, moist mucus membranes.  Trachea midline, no masses. Neurologic: Grossly intact, no focal deficits, moving all 4 extremities. Psychiatric: Normal mood and affect.  This is renal study was personally reviewed.  Agree with radiologic interpretation.  Assessment & Plan:    1. Severe left hydronephrosis secondary to obstructing stones - She has undergone a Lasix renal scan showing only 6% function in the left kidney, indicating that the kidney is not salvageable. - The plan is to proceed with a left nephrectomy.  - The procedure will be performed laparoscopically with hand assistance, minimizing recovery time and surgical risks.   - She has been informed of the potential risks, including bleeding, infection, and other surgical complications.  - Pre-operative urine culture will be obtained to rule out infection.  - The stent will be removed during the nephrectomy.  2. History of recurrent urinary tract infections - She completed a course of antibiotics recently.  - She is advised to report any signs of infection immediately.  - A pre-operative urine culture will be considered if symptoms arise.  - She is encouraged to maintain hydration and monitor for symptoms of infection.  Return for left nephrectomy.  I have reviewed the above documentation for accuracy and completeness, and I agree with the above.   Vanna Scotland, MD   [facility] [address], [address] [place], [place] [address] [phone]

## 2023-08-06 NOTE — Patient Instructions (Signed)
 Laparoscopic Nephrectomy This information is designed to help you, your family and friends prepare for your surgery.   It will also help you plan how to take care of yourself in the weeks following discharge from hospital.  A laparoscopic nephrectomy is an operation to remove one of the two kidneys that sit at the back of the abdominal cavity. The kidneys make urine by filtering waste products and excess fluid from the blood. Urine drains from the kidneys, through the ureters into the bladder where it is stored until the person is ready to go to the toilet. A laparoscopic nephrectomy involves removing an entire kidney through keyhole incisions in the flank, the side of the body between the ribs and the hip.  A nephrectomy is usually done for one of two reasons, either for cancer of the kidney or because of a non- functioning kidney.   In the case of kidney cancer a radical laparoscopic nephrectomy is done. This is done in an attempt to rid the body of cancer by removing the entire kidney and adrenal gland, with its surrounding fat and attached vessels. In more advanced cases it may be done to stop continued bleeding from the effected kidney.  For non-functioning kidneys, which are either caused by large stones, a lack of blood supply or abnormal kidney structure, a simple laparoscopic nephrectomy is done. This is where only the kidney itself is taken and the adrenal gland and other structures are left behind. A simple nephrectomy is usually done to avoid recurrent infection and pain and the possibility of severe illness because of infection.   Treatment A laparoscopic nephrectomy removes the kidney by using laparoscopic equipment. Long thin instruments are passed through up to five small incisions made in the flank, each about 1cm in length. The abdomen is first filled with carbon dioxide, which separates the tissues to allow for vision during the surgery. A camera is then passed, giving the urologist a  detailed picture inside the abdomen. The other incisions are used to pass cutting and suturing instruments so the blood supply to the kidney can be isolated and tied off and the kidney removed either with or without its surrounding structures. A wound drain is then inserted to drain any wound ooze. This is usually stitched in place and stays in for 1 - 2 days.  When the operation is finished, the carbon dioxide air is removed from the abdomen and the cuts are stitched closed with dissolving stitches. The areas are then covered with small plasters. A catheter (drainage tube which drains urine from the bladder) is also inserted to monitor the urine output from the remaining kidney. The catheter usually stays in for 1 - 2 days, or until you are up and about.  Laparoscopic Nephrectomy takes approximately 2 - 4 hours to perform and involves a hospital stay of around 1 - 2 nights.  What are the risks involved with a nephrectomy?  With all surgery, there is a risk of infection and blood loss. The kidneys have a large blood supply and there is always some bleeding involved with surgery. Antibiotics are often given during the operation to prevent infection. In a small number of cases, a blood transfusion may be needed to compensate for blood loss during surgery. If you do need a blood transfusion and you want to refuse one, it is vital that you tell your surgeon and nurse prior to your operation. It is possible to donate some of your own blood prior to surgery. If  you would like to do this please discuss it with your surgeon.  In any laparoscopic surgery, there is also the risk that the surgeon may need to proceed to open surgery, requiring a single, larger incision in the flank. This happens rarely and can be due to equipment failure, excessive bleeding or other difficulties encountered during the surgery.  Your Consent We need your permission for your operation to go ahead. Before you sign the consent form it  is important that you understand the risks and effects of the operation and anaesthetic. Your doctor and the nurse will discuss this with you, should you have any questions, your nurse or doctor would be happy to answer these.  If you would like your kidney returned to you for personal reasons, please discuss this with your family and inform your nurse and surgeon before your operation.  About Your Anaesthetic You will NOT be allowed to eat or drink anything for at least six hours before your surgery. This includes chewing gum and sweets.  You will have a general anaesthetic where you will be asleep throughout the operation and remember nothing of it. Feel free to discuss the anaesthetic and any questions, with your anaesthetist.

## 2023-08-11 ENCOUNTER — Telehealth: Payer: Self-pay

## 2023-08-11 NOTE — Telephone Encounter (Signed)
 Per Dr. Ace Holder, Patient is to be scheduled for  Left Laparoscopic Hand Assisted Simple Nephrectomy and Left Ureteral Stent Removal   Victoria Clarke was contacted and possible surgical dates were discussed, Monday May 5th, 2025 was agreed upon for surgery.   Patient was directed to call [phone] between 1-3pm the day before surgery to find out surgical arrival time.  Instructions were given not to eat or drink from midnight on the night before surgery and have a driver for the day of surgery. On the surgery day patient was instructed to enter through the [facility] entrance of [facility] report the Same Day Surgery desk.   Pre-Admit Testing will be in contact via phone to set up an interview with the anesthesia team to review your history and medications prior to surgery.   Reminder of this information was sent via MyChart to the patient.

## 2023-08-11 NOTE — Progress Notes (Signed)
   [facility] Surgical Posting Form  Surgery Date: Date: 08/31/2023  Surgeon: Dr. [name], MD  Inpt ( Yes  )   Outpt (No)   Obs ( No  )   Diagnosis: N26.1 Left Renal Atrophy, N13.30 Left Hydronephrosis, N13.5 Left Ureteral Obstruction  -CPT: 16109, 52310  Surgery: Left Laparoscopic Hand Assisted Simple Nephrectomy and Left Ureteral Stent Removal  Stop Anticoagulations: Yes and also hold ASA  Cardiac/Medical/Pulmonary Clearance needed: no  *Orders entered into EPIC  Date: 08/11/23   *Case booked in EPIC  Date: 08/10/2023  *Notified pt of Surgery: Date: 08/10/2023  PRE-OP UA & CX: yes and will also obtain CBC, BMP, INR, Type and Screen  *Placed into Prior Authorization Work [place] Date: 08/11/23  Assistant/laser/rep:Yes, Dr. Estanislao Heimlich to Assist on case.

## 2023-08-14 ENCOUNTER — Encounter: Payer: Self-pay | Admitting: Urology

## 2023-08-15 ENCOUNTER — Other Ambulatory Visit: Payer: Self-pay

## 2023-08-15 ENCOUNTER — Emergency Department

## 2023-08-15 ENCOUNTER — Emergency Department
Admission: EM | Admit: 2023-08-15 | Discharge: 2023-08-15 | Disposition: A | Discharge status: Home / Self Care | Attending: Emergency Medicine | Admitting: Emergency Medicine

## 2023-08-15 DIAGNOSIS — N132 Hydronephrosis with renal and ureteral calculous obstruction: Secondary | ICD-10-CM | POA: Insufficient documentation

## 2023-08-15 DIAGNOSIS — B9689 Other specified bacterial agents as the cause of diseases classified elsewhere: Secondary | ICD-10-CM | POA: Insufficient documentation

## 2023-08-15 DIAGNOSIS — R109 Unspecified abdominal pain: Secondary | ICD-10-CM

## 2023-08-15 DIAGNOSIS — N390 Urinary tract infection, site not specified: Secondary | ICD-10-CM | POA: Diagnosis not present

## 2023-08-15 DIAGNOSIS — R10A0 Flank pain, unspecified side: Secondary | ICD-10-CM

## 2023-08-15 DIAGNOSIS — Z960 Presence of urogenital implants: Secondary | ICD-10-CM

## 2023-08-15 DIAGNOSIS — N1330 Unspecified hydronephrosis: Secondary | ICD-10-CM

## 2023-08-15 DIAGNOSIS — N200 Calculus of kidney: Secondary | ICD-10-CM

## 2023-08-15 LAB — CBC
HCT: 36.3 % (ref 36.0–46.0)
Hemoglobin: 11.8 g/dL — ABNORMAL LOW (ref 12.0–15.0)
MCH: 27.1 pg (ref 26.0–34.0)
MCHC: 32.5 g/dL (ref 30.0–36.0)
MCV: 83.4 fL (ref 80.0–100.0)
Platelets: 278 10*3/uL (ref 150–400)
RBC: 4.35 MIL/uL (ref 3.87–5.11)
RDW: 14.9 % (ref 11.5–15.5)
WBC: 15.8 10*3/uL — ABNORMAL HIGH (ref 4.0–10.5)
nRBC: 0 % (ref 0.0–0.2)

## 2023-08-15 LAB — URINALYSIS, W/ REFLEX TO CULTURE (INFECTION SUSPECTED)
Bacteria, UA: NONE SEEN
Bilirubin Urine: NEGATIVE
Glucose, UA: NEGATIVE mg/dL
Ketones, ur: NEGATIVE mg/dL
Nitrite: NEGATIVE
Protein, ur: NEGATIVE mg/dL
Specific Gravity, Urine: 1.036 — ABNORMAL HIGH (ref 1.005–1.030)
Squamous Epithelial / HPF: 0 /HPF (ref 0–5)
pH: 5 (ref 5.0–8.0)

## 2023-08-15 LAB — BASIC METABOLIC PANEL WITH GFR
Anion gap: 10 (ref 5–15)
BUN: 18 mg/dL (ref 8–23)
CO2: 26 mmol/L (ref 22–32)
Calcium: 9.5 mg/dL (ref 8.9–10.3)
Chloride: 96 mmol/L — ABNORMAL LOW (ref 98–111)
Creatinine, Ser: 1.04 mg/dL — ABNORMAL HIGH (ref 0.44–1.00)
GFR, Estimated: 57 mL/min — ABNORMAL LOW (ref >60)
Glucose, Bld: 140 mg/dL — ABNORMAL HIGH (ref 70–99)
Potassium: 4.6 mmol/L (ref 3.5–5.1)
Sodium: 132 mmol/L — ABNORMAL LOW (ref 135–145)

## 2023-08-15 LAB — HEPATIC FUNCTION PANEL
ALT: 14 U/L (ref 0–44)
AST: 17 U/L (ref 15–41)
Albumin: 3.2 g/dL — ABNORMAL LOW (ref 3.5–5.0)
Alkaline Phosphatase: 71 U/L (ref 38–126)
Bilirubin, Direct: 0.2 mg/dL (ref 0.0–0.2)
Indirect Bilirubin: 1.1 mg/dL — ABNORMAL HIGH (ref 0.3–0.9)
Total Bilirubin: 1.3 mg/dL — ABNORMAL HIGH (ref 0.0–1.2)
Total Protein: 7.7 g/dL (ref 6.5–8.1)

## 2023-08-15 LAB — LIPASE, BLOOD: Lipase: 26 U/L (ref 11–51)

## 2023-08-15 LAB — LACTIC ACID, PLASMA: Lactic Acid, Venous: 1.4 mmol/L (ref 0.5–1.9)

## 2023-08-15 MED ORDER — ONDANSETRON HCL 4 MG/2ML IJ SOLN
4.0000 mg | Freq: Once | INTRAMUSCULAR | Status: AC
  Administered 2023-08-15: 4 mg via INTRAVENOUS
  Filled 2023-08-15: qty 2

## 2023-08-15 MED ORDER — MORPHINE SULFATE (PF) 2 MG/ML IV SOLN
2.0000 mg | Freq: Once | INTRAVENOUS | Status: AC
  Administered 2023-08-15: 2 mg via INTRAVENOUS
  Filled 2023-08-15: qty 1

## 2023-08-15 MED ORDER — SODIUM CHLORIDE 0.9 % IV SOLN
1.0000 g | Freq: Once | INTRAVENOUS | Status: AC
  Administered 2023-08-15: 1 g via INTRAVENOUS
  Filled 2023-08-15: qty 10

## 2023-08-15 MED ORDER — MORPHINE SULFATE (PF) 4 MG/ML IV SOLN
4.0000 mg | Freq: Once | INTRAVENOUS | Status: AC
  Administered 2023-08-15: 4 mg via INTRAVENOUS
  Filled 2023-08-15: qty 1

## 2023-08-15 MED ORDER — CEFDINIR 300 MG PO CAPS
300.0000 mg | ORAL_CAPSULE | Freq: Two times a day (BID) | ORAL | 0 refills | Status: AC
Start: 2023-08-15 — End: 2023-08-29

## 2023-08-15 MED ORDER — IOHEXOL 300 MG/ML  SOLN
80.0000 mL | Freq: Once | INTRAMUSCULAR | Status: AC | PRN
  Administered 2023-08-15: 80 mL via INTRAVENOUS

## 2023-08-15 MED ORDER — LACTATED RINGERS IV BOLUS
1000.0000 mL | Freq: Once | INTRAVENOUS | Status: AC
  Administered 2023-08-15: 1000 mL via INTRAVENOUS

## 2023-08-15 NOTE — ED Triage Notes (Signed)
 Pt c/o severe pain in both left lower back and right lower back, and nausea since Thursday. States she ran a fever last night. Pt states she has a kidney stone in the left kidney, has a stent in place. States she has surgery scheduled for May 5th to have her left kidney removed.

## 2023-08-15 NOTE — Discharge Instructions (Addendum)
 Please take the antibiotics as prescribed for your urinary tract infection.  Please call urology on Monday to schedule follow-up.

## 2023-08-15 NOTE — ED Provider Notes (Signed)
 [facility] Provider Note    Event Date/Time   First MD Initiated Contact with Patient 08/15/23 1655     (approximate)   History   Flank Pain   HPI  Deneene Tarver is a 73 y.o. female with history of recurrent UTI, nephrolithiasis, atrophic left kidney, presenting with left back pain, flank pain, nausea vomiting since Thursday.  Also had subjective fevers yesterday.  States that she now feels some aching at her right lower back as well.  No chest pain, shortness of breath, diarrhea, hematuria, dysuria.  Has a stent in from her prior admission.  States that she completed her antibiotics for her prior septic stone admission last month.  On independent review, she was admitted at the end of March for sepsis thought secondary to UTI as well as obstructive left nephrolithiasis.  She was seen by urology and had a stent placed, she was switched to oral cephalosporins prior to discharge for which she states that she completed her course.     Physical Exam   Triage Vital Signs: ED Triage Vitals  Encounter Vitals Group     BP 08/15/23 1649 109/83     Systolic BP Percentile --      Diastolic BP Percentile --      Pulse Rate 08/15/23 1649 (!) 120     Resp 08/15/23 1649 20     Temp 08/15/23 1649 98.5 F (36.9 C)     Temp Source 08/15/23 1649 Oral     SpO2 --      Weight 08/15/23 1650 168 lb 3.4 oz (76.3 kg)     Height 08/15/23 1650 5\' 4"  (1.626 m)     Head Circumference --      Peak Flow --      Pain Score 08/15/23 1649 8     Pain Loc --      Pain Education --      Exclude from Growth Chart --     Most recent vital signs: Vitals:   08/15/23 1800 08/15/23 1957  BP: 134/64   Pulse: 91   Resp:    Temp:    SpO2: 100% 100%     General: Awake, no distress.  CV:  Good peripheral perfusion.  Tachycardic Resp:  Normal effort.  No respiratory distress or increased work of breathing Abd:  No distention.  Soft nontender Other:  Left CVA tenderness, left  flank tenderness without overlying rash, no CVA tenderness on the right, no midline spinal tenderness, does have mild discomfort to her right lower paralumbar region on palpation   ED Results / Procedures / Treatments   Labs (all labs ordered are listed, but only abnormal results are displayed) Labs Reviewed  CBC - Abnormal; Notable for the following components:      Result Value   WBC 15.8 (*)    Hemoglobin 11.8 (*)    All other components within normal limits  BASIC METABOLIC PANEL WITH GFR - Abnormal; Notable for the following components:   Sodium 132 (*)    Chloride 96 (*)    Glucose, Bld 140 (*)    Creatinine, Ser 1.04 (*)    GFR, Estimated 57 (*)    All other components within normal limits  HEPATIC FUNCTION PANEL - Abnormal; Notable for the following components:   Albumin 3.2 (*)    Total Bilirubin 1.3 (*)    Indirect Bilirubin 1.1 (*)    All other components within normal limits  URINALYSIS, W/ REFLEX TO  CULTURE (INFECTION SUSPECTED) - Abnormal; Notable for the following components:   Color, Urine YELLOW (*)    APPearance CLEAR (*)    Specific Gravity, Urine 1.036 (*)    Hgb urine dipstick MODERATE (*)    Leukocytes,Ua TRACE (*)    All other components within normal limits  LIPASE, BLOOD  LACTIC ACID, PLASMA  LACTIC ACID, PLASMA      RADIOLOGY CT imaging study on my independent interpretation shows left-sided hydro with a large stone.   PROCEDURES:  Critical Care performed: No  Procedures   MEDICATIONS ORDERED IN ED: Medications  lactated ringers  bolus 1,000 mL (0 mLs Intravenous Stopped 08/15/23 1835)  ondansetron  (ZOFRAN ) injection 4 mg (4 mg Intravenous Given 08/15/23 1714)  morphine  (PF) 2 MG/ML injection 2 mg (2 mg Intravenous Given 08/15/23 1713)  iohexol  (OMNIPAQUE ) 300 MG/ML solution 80 mL (80 mLs Intravenous Contrast Given 08/15/23 1753)  morphine  (PF) 4 MG/ML injection 4 mg (4 mg Intravenous Given 08/15/23 1829)  cefTRIAXone  (ROCEPHIN ) 1 g in  sodium chloride  0.9 % 100 mL IVPB (1 g Intravenous New Bag/Given 08/15/23 1951)     IMPRESSION / MDM / ASSESSMENT AND PLAN / ED COURSE  I reviewed the triage vital signs and the nursing notes.                              Differential diagnosis includes, but is not limited to, septic stone, nephrolithiasis, pyelonephritis, abscess, stent obstruction, UTI, electrolyte derangements, dehydration.  Will get labs, UA, lactic acid, CT abdomen pelvis, fluids, IV morphine , IV Zofran .  Patient's presentation is most consistent with acute presentation with potential threat to life or bodily function.  Independent review of labs and imaging below.  Clinical course as below.  Patient was given IV ceftriaxone .  On reassessment after antibiotics, she is feeling good, asking to discharge.  Does not want to stay.  Considered but no indication for inpatient admission at this time, I believe she is safe for outpatient management.  Will give her a 14-day course of cefdinir  for home, instructed her to call her urologist on Monday to schedule close follow-up.  She is agreeable with the plan.  Discharged with strict return precautions.  Clinical Course as of 08/15/23 2035  Sat Aug 15, 2023  1850 On reassessment patient still having moderate pain, will give her additional IV morphine . [TT]  1929 CT ABDOMEN PELVIS W CONTRAST IMPRESSION: 1. Stable positioning of a 2.1 mm obstructing renal calculus within the proximal left ureter, with stable left-sided hydronephrosis and hydroureter despite interval left-sided endo ureteral stent placement. 2. Interval increase in severity of left-sided perinephric and peripelvic inflammatory fat stranding. While this may be secondary to the previously noted obstructing renal calculus, sequelae associated with acute pyelonephritis cannot be excluded. 3. Colonic diverticulosis.   [TT]  1929 Urinalysis, w/ Reflex to Culture (Infection Suspected) -Urine, Clean Catch(!) UA shows  trace leuk esterase, WBCs are 6-10, CT scan showed interval increase in left perinephric and peripelvic stranding, given her increased pain and UA findings, would like to treat her for UTI and plan to have her admitted for pain control. [TT]  1930 On independent chart review, patient had prior cultures that were E. coli that was susceptible to cephalosporins.  Will give her a dose of IV ceftriaxone  here. [TT]  1938 On reassessment patient is feeling a lot better, discussed imaging as well as lab results with patient including the UTI and decision to  start her on antibiotics, will give her dose of IV ceftriaxone  here.  Offered admission but patient declined, stating that if we send her home with antibiotics she can follow-up with urology outpatient also given that her pain is more controlled now, she would like to go home. [TT]  1941 Given that she is not tachycardic, blood pressures are stable, lactate is not elevated, afebrile here, I believe that is reasonable for her to go home with p.o. antibiotics.  Instructed her to follow-up with her urologist, call the office on Monday to follow-up.  Strict turn precautions given.  She is agreeable with the plan. [TT]  1942 Independent review of labs, she is leukocytosis, electrolytes not severely deranged, creatinine is mildly elevated compared to prior, lipase is normal, lactate is normal. [TT]    Clinical Course User Index [TT] Drenda Gentle, Richard Champion, MD     FINAL CLINICAL IMPRESSION(S) / ED DIAGNOSES   Final diagnoses:  Flank pain  Kidney stones  Hydronephrosis, unspecified hydronephrosis type  Ureteral stent present  Urinary tract infection without hematuria, site unspecified     Rx / DC Orders   ED Discharge Orders          Ordered    cefdinir  (OMNICEF ) 300 MG capsule  2 times daily        08/15/23 2033             Note:  This document was prepared using Dragon voice recognition software and may include unintentional dictation errors.     Shane Darling, MD 08/15/23 [phone]

## 2023-08-17 ENCOUNTER — Encounter: Payer: Self-pay | Admitting: Urology

## 2023-08-17 ENCOUNTER — Other Ambulatory Visit: Payer: Self-pay | Admitting: Urology

## 2023-08-17 MED ORDER — ONDANSETRON 4 MG PO TBDP: 4.0000 mg | ORAL_TABLET | Freq: Three times a day (TID) | ORAL | 0 refills | Status: DC | PRN

## 2023-08-17 NOTE — Telephone Encounter (Signed)
 Patient ended up going to ER on 08/15/23 per chart notes.

## 2023-08-24 ENCOUNTER — Other Ambulatory Visit: Payer: Self-pay

## 2023-08-24 ENCOUNTER — Encounter
Admission: RE | Admit: 2023-08-24 | Discharge: 2023-08-24 | Disposition: A | Discharge status: Home / Self Care | Source: Ambulatory Visit | Attending: Urology | Admitting: Urology

## 2023-08-24 DIAGNOSIS — I10 Essential (primary) hypertension: Secondary | ICD-10-CM

## 2023-08-24 DIAGNOSIS — Z01810 Encounter for preprocedural cardiovascular examination: Secondary | ICD-10-CM

## 2023-08-24 HISTORY — DX: Obesity, unspecified: E66.9

## 2023-08-24 HISTORY — DX: Anemia, unspecified: D64.9

## 2023-08-24 HISTORY — DX: Personal history of nicotine dependence: Z87.891

## 2023-08-24 HISTORY — DX: Sepsis, unspecified organism: A41.9

## 2023-08-24 HISTORY — DX: Unspecified hydronephrosis: N13.30

## 2023-08-24 HISTORY — DX: Urinary tract infection, site not specified: N39.0

## 2023-08-24 HISTORY — DX: Crossing vessel and stricture of ureter without hydronephrosis: N13.5

## 2023-08-24 HISTORY — DX: Atrophy of kidney (terminal): N26.1

## 2023-08-24 NOTE — Patient Instructions (Signed)
 Your procedure is scheduled on:08-31-23 Monday Report to the Registration Desk on the 1st floor of the [facility].Then proceed to the 2nd floor Surgery Desk To find out your arrival time, please call [phone] between 1PM - 3PM on:08-28-23 Friday If your arrival time is 6:00 am, do not arrive before that time as the [facility] entrance doors do not open until 6:00 am.  REMEMBER: Instructions that are not followed completely may result in serious medical risk, up to and including death; or upon the discretion of your surgeon and anesthesiologist your surgery may need to be rescheduled.  Do not eat food OR drink any liquids after midnight the night before surgery.  No gum chewing or hard candies.  CLEAR LIQUIDS ONLY THE ENTIRE DAY PRIOR TO SURGERY PER DR. BRANDON [facility])  One week prior to surgery:Stop NOW (08-24-23) Stop Anti-inflammatories (NSAIDS) such as Advil, Aleve, Ibuprofen, Motrin, Naproxen, Naprosyn and Aspirin based products such as Excedrin, Goody's Powder, BC Powder. Stop ANY OVER THE COUNTER supplements until after surgery.  You may however, continue to take Tylenol  if needed for pain up until the day of surgery.  Continue taking all of your other prescription medications up until the day of surgery.  ON THE DAY OF SURGERY ONLY TAKE THESE MEDICATIONS WITH SIPS OF WATER : -atorvastatin  (LIPITOR)   No Alcohol for 24 hours before or after surgery.  No Smoking including e-cigarettes for 24 hours before surgery.  No chewable tobacco products for at least 6 hours before surgery.  No nicotine patches on the day of surgery.  Do not use any "recreational" drugs for at least a week (preferably 2 weeks) before your surgery.  Please be advised that the combination of cocaine and anesthesia may have negative outcomes, up to and including death. If you test positive for cocaine, your surgery will be cancelled.  On the morning of surgery brush your teeth with toothpaste and water ,  you may rinse your mouth with mouthwash if you wish. Do not swallow any toothpaste or mouthwash.  Use CHG Soap as directed on instruction sheet.  Do not wear jewelry, make-up, hairpins, clips or nail polish.  For welded (permanent) jewelry: bracelets, anklets, waist bands, etc.  Please have this removed prior to surgery.  If it is not removed, there is a chance that hospital personnel will need to cut it off on the day of surgery.  Do not wear lotions, powders, or perfumes.   Do not shave body hair from the neck down 48 hours before surgery.  Contact lenses, hearing aids and dentures may not be worn into surgery.  Do not bring valuables to the hospital. [facility] is not responsible for any missing/lost belongings or valuables.   Notify your doctor if there is any change in your medical condition (cold, fever, infection).  Wear comfortable clothing (specific to your surgery type) to the hospital.  After surgery, you can help prevent lung complications by doing breathing exercises.  Take deep breaths and cough every 1-2 hours. Your doctor may order a device called an Incentive Spirometer to help you take deep breaths. When coughing or sneezing, hold a pillow firmly against your incision with both hands. This is called "splinting." Doing this helps protect your incision. It also decreases belly discomfort.  If you are being admitted to the hospital overnight, leave your suitcase in the car. After surgery it may be brought to your room.  In case of increased patient census, it may be necessary for you, the  patient, to continue your postoperative care in the Same Day Surgery department.  If you are being discharged the day of surgery, you will not be allowed to drive home. You will need a responsible individual to drive you home and stay with you for 24 hours after surgery.   If you are taking public transportation, you will need to have a responsible individual with you.  Please call  the Pre-admissions Testing Dept. at [phone] if you have any questions about these instructions.  Surgery Visitation Policy:  Patients having surgery or a procedure may have two visitors.  Children under the age of 15 must have an adult with them who is not the patient.  Inpatient Visitation:    Visiting hours are 7 a.m. to 8 p.m. Up to four visitors are allowed at one time in a patient room. The visitors may rotate out with other people during the day.  One visitor age 58 or older may stay with the patient overnight and must be in the room by 8 p.m.     Preparing for Surgery with CHLORHEXIDINE GLUCONATE (CHG) Soap  Chlorhexidine Gluconate (CHG) Soap  o An antiseptic cleaner that kills germs and bonds with the skin to continue killing germs even after washing  o Used for showering the night before surgery and morning of surgery  Before surgery, you can play an important role by reducing the number of germs on your skin.  CHG (Chlorhexidine gluconate) soap is an antiseptic cleanser which kills germs and bonds with the skin to continue killing germs even after washing.  Please do not use if you have an allergy to CHG or antibacterial soaps. If your skin becomes reddened/irritated stop using the CHG.  1. Shower the NIGHT BEFORE SURGERY and the MORNING OF SURGERY with CHG soap.  2. If you choose to wash your hair, wash your hair first as usual with your normal shampoo.  3. After shampooing, rinse your hair and body thoroughly to remove the shampoo.  4. Use CHG as you would any other liquid soap. You can apply CHG directly to the skin and wash gently with a scrungie or a clean washcloth.  5. Apply the CHG soap to your body only from the neck down. Do not use on open wounds or open sores. Avoid contact with your eyes, ears, mouth, and genitals (private parts). Wash face and genitals (private parts) with your normal soap.  6. Wash thoroughly, paying special attention to the area  where your surgery will be performed.  7. Thoroughly rinse your body with warm water .  8. Do not shower/wash with your normal soap after using and rinsing off the CHG soap.  9. Pat yourself dry with a clean towel.  10. Wear clean pajamas to bed the night before surgery.  12. Place clean sheets on your bed the night of your first shower and do not sleep with pets.  13. Shower again with the CHG soap on the day of surgery prior to arriving at the hospital.  14. Do not apply any deodorants/lotions/powders.  15. Please wear clean clothes to the hospital.

## 2023-08-26 ENCOUNTER — Encounter
Admission: RE | Admit: 2023-08-26 | Discharge: 2023-08-26 | Disposition: A | Discharge status: Home / Self Care | Source: Ambulatory Visit | Attending: Urology | Admitting: Urology

## 2023-08-26 DIAGNOSIS — Z01818 Encounter for other preprocedural examination: Secondary | ICD-10-CM | POA: Insufficient documentation

## 2023-08-26 DIAGNOSIS — N261 Atrophy of kidney (terminal): Secondary | ICD-10-CM | POA: Diagnosis not present

## 2023-08-26 DIAGNOSIS — Z01810 Encounter for preprocedural cardiovascular examination: Secondary | ICD-10-CM

## 2023-08-26 DIAGNOSIS — I10 Essential (primary) hypertension: Secondary | ICD-10-CM | POA: Diagnosis not present

## 2023-08-26 DIAGNOSIS — B965 Pseudomonas (aeruginosa) (mallei) (pseudomallei) as the cause of diseases classified elsewhere: Secondary | ICD-10-CM | POA: Diagnosis not present

## 2023-08-26 DIAGNOSIS — N1330 Unspecified hydronephrosis: Secondary | ICD-10-CM

## 2023-08-26 DIAGNOSIS — N135 Crossing vessel and stricture of ureter without hydronephrosis: Secondary | ICD-10-CM

## 2023-08-26 DIAGNOSIS — N2889 Other specified disorders of kidney and ureter: Secondary | ICD-10-CM

## 2023-08-26 LAB — TYPE AND SCREEN
ABO/RH(D): A NEG
Antibody Screen: NEGATIVE

## 2023-08-26 LAB — PROTIME-INR
INR: 1.1 (ref 0.8–1.2)
Prothrombin Time: 14.2 s (ref 11.4–15.2)

## 2023-08-26 LAB — URINALYSIS, COMPLETE (UACMP) WITH MICROSCOPIC
Bilirubin Urine: NEGATIVE
Glucose, UA: NEGATIVE mg/dL
Ketones, ur: 5 mg/dL — AB
Nitrite: NEGATIVE
Protein, ur: 30 mg/dL — AB
Specific Gravity, Urine: 1.021 (ref 1.005–1.030)
pH: 5 (ref 5.0–8.0)

## 2023-08-28 ENCOUNTER — Telehealth: Payer: Self-pay

## 2023-08-28 ENCOUNTER — Encounter: Payer: Self-pay | Admitting: Urology

## 2023-08-28 MED ORDER — CIPROFLOXACIN HCL 500 MG PO TABS: 500.0000 mg | ORAL_TABLET | Freq: Two times a day (BID) | ORAL | 0 refills | Status: DC

## 2023-08-28 NOTE — Telephone Encounter (Signed)
 Spoke with pt. Pt. Advised to start taking Cipro  today. Medication sent in to [facility] in [place] per patient request. Patient verbalized understanding.

## 2023-08-28 NOTE — Telephone Encounter (Signed)
 Does patient need to start Cipro  instead of the one she is on still?

## 2023-08-28 NOTE — Telephone Encounter (Signed)
-----   Message from [name] sent at 08/28/2023 11:59 AM EDT ----- Preop urine culture was positive.  We need to get her started on Cipro  500 twice daily for 7 days starting today.  [name], MD

## 2023-08-28 NOTE — Telephone Encounter (Signed)
 Disregard see phone note.

## 2023-08-29 LAB — URINE CULTURE: Culture: 10000 — AB

## 2023-08-31 ENCOUNTER — Encounter
Admission: RE | Disposition: A | Discharge status: Home / Self Care | Payer: Self-pay | Source: Ambulatory Visit | Attending: Urology

## 2023-08-31 ENCOUNTER — Other Ambulatory Visit: Payer: Self-pay

## 2023-08-31 ENCOUNTER — Inpatient Hospital Stay
Admission: RE | Admit: 2023-08-31 | Discharge: 2023-09-06 | DRG: 659 | Disposition: A | Discharge status: Home / Self Care | Source: Ambulatory Visit | Attending: Urology | Admitting: Urology

## 2023-08-31 ENCOUNTER — Inpatient Hospital Stay: Admitting: Certified Registered Nurse Anesthetist

## 2023-08-31 ENCOUNTER — Inpatient Hospital Stay: Payer: Self-pay | Admitting: Urgent Care

## 2023-08-31 ENCOUNTER — Encounter: Payer: Self-pay | Admitting: Urology

## 2023-08-31 DIAGNOSIS — Z860100 Personal history of colon polyps, unspecified: Secondary | ICD-10-CM

## 2023-08-31 DIAGNOSIS — Z8619 Personal history of other infectious and parasitic diseases: Secondary | ICD-10-CM

## 2023-08-31 DIAGNOSIS — J9601 Acute respiratory failure with hypoxia: Secondary | ICD-10-CM | POA: Diagnosis not present

## 2023-08-31 DIAGNOSIS — Z9049 Acquired absence of other specified parts of digestive tract: Secondary | ICD-10-CM

## 2023-08-31 DIAGNOSIS — E785 Hyperlipidemia, unspecified: Secondary | ICD-10-CM | POA: Insufficient documentation

## 2023-08-31 DIAGNOSIS — N200 Calculus of kidney: Secondary | ICD-10-CM | POA: Diagnosis not present

## 2023-08-31 DIAGNOSIS — N1330 Unspecified hydronephrosis: Secondary | ICD-10-CM | POA: Diagnosis present

## 2023-08-31 DIAGNOSIS — N179 Acute kidney failure, unspecified: Secondary | ICD-10-CM | POA: Diagnosis not present

## 2023-08-31 DIAGNOSIS — Z905 Acquired absence of kidney: Secondary | ICD-10-CM | POA: Diagnosis not present

## 2023-08-31 DIAGNOSIS — I10 Essential (primary) hypertension: Secondary | ICD-10-CM | POA: Diagnosis present

## 2023-08-31 DIAGNOSIS — R6520 Severe sepsis without septic shock: Secondary | ICD-10-CM | POA: Diagnosis not present

## 2023-08-31 DIAGNOSIS — B965 Pseudomonas (aeruginosa) (mallei) (pseudomallei) as the cause of diseases classified elsewhere: Secondary | ICD-10-CM | POA: Diagnosis present

## 2023-08-31 DIAGNOSIS — N261 Atrophy of kidney (terminal): Secondary | ICD-10-CM

## 2023-08-31 DIAGNOSIS — Z79899 Other long term (current) drug therapy: Secondary | ICD-10-CM | POA: Diagnosis not present

## 2023-08-31 DIAGNOSIS — N390 Urinary tract infection, site not specified: Secondary | ICD-10-CM | POA: Diagnosis not present

## 2023-08-31 DIAGNOSIS — Z87440 Personal history of urinary (tract) infections: Secondary | ICD-10-CM

## 2023-08-31 DIAGNOSIS — N135 Crossing vessel and stricture of ureter without hydronephrosis: Secondary | ICD-10-CM

## 2023-08-31 DIAGNOSIS — N039 Chronic nephritic syndrome with unspecified morphologic changes: Secondary | ICD-10-CM | POA: Diagnosis not present

## 2023-08-31 DIAGNOSIS — Z87891 Personal history of nicotine dependence: Secondary | ICD-10-CM | POA: Diagnosis not present

## 2023-08-31 DIAGNOSIS — E872 Acidosis, unspecified: Secondary | ICD-10-CM | POA: Diagnosis present

## 2023-08-31 DIAGNOSIS — N2889 Other specified disorders of kidney and ureter: Secondary | ICD-10-CM | POA: Diagnosis present

## 2023-08-31 DIAGNOSIS — R197 Diarrhea, unspecified: Secondary | ICD-10-CM | POA: Diagnosis not present

## 2023-08-31 DIAGNOSIS — Z6830 Body mass index (BMI) 30.0-30.9, adult: Secondary | ICD-10-CM

## 2023-08-31 DIAGNOSIS — N009 Acute nephritic syndrome with unspecified morphologic changes: Secondary | ICD-10-CM | POA: Diagnosis not present

## 2023-08-31 DIAGNOSIS — E7800 Pure hypercholesterolemia, unspecified: Secondary | ICD-10-CM | POA: Diagnosis present

## 2023-08-31 DIAGNOSIS — D62 Acute posthemorrhagic anemia: Secondary | ICD-10-CM | POA: Diagnosis not present

## 2023-08-31 DIAGNOSIS — E871 Hypo-osmolality and hyponatremia: Secondary | ICD-10-CM | POA: Diagnosis not present

## 2023-08-31 DIAGNOSIS — E669 Obesity, unspecified: Secondary | ICD-10-CM | POA: Diagnosis present

## 2023-08-31 DIAGNOSIS — Z803 Family history of malignant neoplasm of breast: Secondary | ICD-10-CM

## 2023-08-31 DIAGNOSIS — E875 Hyperkalemia: Secondary | ICD-10-CM | POA: Diagnosis present

## 2023-08-31 DIAGNOSIS — Z83719 Family history of colon polyps, unspecified: Secondary | ICD-10-CM

## 2023-08-31 DIAGNOSIS — R0902 Hypoxemia: Secondary | ICD-10-CM | POA: Diagnosis not present

## 2023-08-31 DIAGNOSIS — A4150 Gram-negative sepsis, unspecified: Secondary | ICD-10-CM | POA: Diagnosis present

## 2023-08-31 DIAGNOSIS — Z9889 Other specified postprocedural states: Secondary | ICD-10-CM

## 2023-08-31 DIAGNOSIS — Z98891 History of uterine scar from previous surgery: Secondary | ICD-10-CM

## 2023-08-31 DIAGNOSIS — I9589 Other hypotension: Secondary | ICD-10-CM | POA: Diagnosis not present

## 2023-08-31 DIAGNOSIS — N136 Pyonephrosis: Principal | ICD-10-CM | POA: Diagnosis present

## 2023-08-31 DIAGNOSIS — Z820 Family history of epilepsy and other diseases of the nervous system: Secondary | ICD-10-CM

## 2023-08-31 HISTORY — PX: CYSTOSCOPY W/ URETERAL STENT REMOVAL: SHX1430

## 2023-08-31 HISTORY — PX: LAPAROSCOPIC NEPHRECTOMY, HAND ASSISTED: SHX1929

## 2023-08-31 LAB — ABO/RH: ABO/RH(D): A NEG

## 2023-08-31 SURGERY — NEPHRECTOMY, HAND-ASSISTED, LAPAROSCOPIC
Anesthesia: General | Site: Ureter | Laterality: Left

## 2023-08-31 MED ORDER — LIDOCAINE HCL (CARDIAC) PF 100 MG/5ML IV SOSY
PREFILLED_SYRINGE | INTRAVENOUS | Status: DC | PRN
  Administered 2023-08-31: 80 mg via INTRAVENOUS

## 2023-08-31 MED ORDER — DEXAMETHASONE SODIUM PHOSPHATE 10 MG/ML IJ SOLN
INTRAMUSCULAR | Status: AC
  Filled 2023-08-31: qty 1

## 2023-08-31 MED ORDER — ONDANSETRON HCL 4 MG/2ML IJ SOLN
4.0000 mg | INTRAMUSCULAR | Status: DC | PRN
  Administered 2023-08-31 – 2023-09-03 (×3): 4 mg via INTRAVENOUS
  Filled 2023-08-31 (×3): qty 2

## 2023-08-31 MED ORDER — AMLODIPINE BESYLATE 10 MG PO TABS
10.0000 mg | ORAL_TABLET | Freq: Every day | ORAL | Status: DC
  Administered 2023-09-01 – 2023-09-02 (×2): 10 mg via ORAL
  Filled 2023-08-31 (×2): qty 1

## 2023-08-31 MED ORDER — LACTATED RINGERS IV SOLN: INTRAVENOUS | Status: DC

## 2023-08-31 MED ORDER — ROCURONIUM BROMIDE 10 MG/ML (PF) SYRINGE
PREFILLED_SYRINGE | INTRAVENOUS | Status: AC
  Filled 2023-08-31: qty 10

## 2023-08-31 MED ORDER — ONDANSETRON 4 MG PO TBDP: 4.0000 mg | ORAL_TABLET | Freq: Three times a day (TID) | ORAL | Status: DC | PRN

## 2023-08-31 MED ORDER — FENTANYL CITRATE (PF) 100 MCG/2ML IJ SOLN
INTRAMUSCULAR | Status: DC | PRN
  Administered 2023-08-31 (×2): 50 ug via INTRAVENOUS

## 2023-08-31 MED ORDER — PHENYLEPHRINE 80 MCG/ML (10ML) SYRINGE FOR IV PUSH (FOR BLOOD PRESSURE SUPPORT)
PREFILLED_SYRINGE | INTRAVENOUS | Status: AC
  Filled 2023-08-31: qty 10

## 2023-08-31 MED ORDER — MORPHINE SULFATE (PF) 2 MG/ML IV SOLN
2.0000 mg | INTRAVENOUS | Status: DC | PRN
  Administered 2023-08-31: 4 mg via INTRAVENOUS
  Administered 2023-08-31: 2 mg via INTRAVENOUS
  Filled 2023-08-31: qty 1
  Filled 2023-08-31: qty 2

## 2023-08-31 MED ORDER — ACETAMINOPHEN 325 MG PO TABS
650.0000 mg | ORAL_TABLET | ORAL | Status: DC | PRN
  Administered 2023-09-01 – 2023-09-06 (×3): 650 mg via ORAL
  Filled 2023-08-31 (×3): qty 2

## 2023-08-31 MED ORDER — LIDOCAINE HCL (PF) 2 % IJ SOLN
INTRAMUSCULAR | Status: AC
  Filled 2023-08-31: qty 5

## 2023-08-31 MED ORDER — FENTANYL CITRATE (PF) 100 MCG/2ML IJ SOLN
25.0000 ug | INTRAMUSCULAR | Status: DC | PRN
  Administered 2023-08-31 (×4): 25 ug via INTRAVENOUS

## 2023-08-31 MED ORDER — BUPIVACAINE HCL (PF) 0.5 % IJ SOLN
INTRAMUSCULAR | Status: AC
Start: 2023-08-31 — End: ?
  Filled 2023-08-31: qty 30

## 2023-08-31 MED ORDER — ROCURONIUM BROMIDE 100 MG/10ML IV SOLN
INTRAVENOUS | Status: DC | PRN
  Administered 2023-08-31: 20 mg via INTRAVENOUS
  Administered 2023-08-31: 60 mg via INTRAVENOUS
  Administered 2023-08-31: 10 mg via INTRAVENOUS
  Administered 2023-08-31: 20 mg via INTRAVENOUS

## 2023-08-31 MED ORDER — CHLORHEXIDINE GLUCONATE 0.12 % MT SOLN
OROMUCOSAL | Status: AC
Start: 2023-08-31 — End: ?
  Filled 2023-08-31: qty 15

## 2023-08-31 MED ORDER — CHLORHEXIDINE GLUCONATE 4 % EX SOLN: 60.0000 mL | Freq: Once | CUTANEOUS | Status: DC

## 2023-08-31 MED ORDER — CEFAZOLIN SODIUM-DEXTROSE 2-4 GM/100ML-% IV SOLN
INTRAVENOUS | Status: AC
  Filled 2023-08-31: qty 100

## 2023-08-31 MED ORDER — OXYCODONE-ACETAMINOPHEN 5-325 MG PO TABS
1.0000 | ORAL_TABLET | ORAL | Status: DC | PRN
  Administered 2023-08-31 – 2023-09-01 (×3): 2 via ORAL
  Filled 2023-08-31 (×3): qty 2

## 2023-08-31 MED ORDER — ONDANSETRON HCL 4 MG/2ML IJ SOLN
INTRAMUSCULAR | Status: DC | PRN
  Administered 2023-08-31: 4 mg via INTRAVENOUS

## 2023-08-31 MED ORDER — OXYCODONE HCL 5 MG/5ML PO SOLN: 5.0000 mg | Freq: Once | ORAL | Status: DC | PRN

## 2023-08-31 MED ORDER — PROPOFOL 10 MG/ML IV BOLUS
INTRAVENOUS | Status: AC
  Filled 2023-08-31: qty 60

## 2023-08-31 MED ORDER — BUPIVACAINE LIPOSOME 1.3 % IJ SUSP
INTRAMUSCULAR | Status: AC
  Filled 2023-08-31: qty 20

## 2023-08-31 MED ORDER — STERILE WATER FOR IRRIGATION IR SOLN
Status: DC | PRN
  Administered 2023-08-31: 3000 mL
  Administered 2023-08-31: 1000 mL

## 2023-08-31 MED ORDER — DOCUSATE SODIUM 100 MG PO CAPS
100.0000 mg | ORAL_CAPSULE | Freq: Two times a day (BID) | ORAL | Status: DC
  Administered 2023-08-31 – 2023-09-03 (×7): 100 mg via ORAL
  Filled 2023-08-31 (×8): qty 1

## 2023-08-31 MED ORDER — CIPROFLOXACIN HCL 500 MG PO TABS
500.0000 mg | ORAL_TABLET | Freq: Two times a day (BID) | ORAL | Status: DC
  Administered 2023-08-31 – 2023-09-01 (×2): 500 mg via ORAL
  Filled 2023-08-31 (×2): qty 1

## 2023-08-31 MED ORDER — OXYBUTYNIN CHLORIDE 5 MG PO TABS: 5.0000 mg | ORAL_TABLET | Freq: Three times a day (TID) | ORAL | Status: DC | PRN

## 2023-08-31 MED ORDER — PHENYLEPHRINE HCL-NACL 20-0.9 MG/250ML-% IV SOLN
INTRAVENOUS | Status: AC
  Filled 2023-08-31: qty 250

## 2023-08-31 MED ORDER — SURGIFLO WITH THROMBIN (HEMOSTATIC MATRIX KIT) OPTIME
TOPICAL | Status: DC | PRN
  Administered 2023-08-31: 1 via TOPICAL

## 2023-08-31 MED ORDER — CHLORHEXIDINE GLUCONATE 0.12 % MT SOLN
15.0000 mL | Freq: Once | OROMUCOSAL | Status: AC
  Administered 2023-08-31: 15 mL via OROMUCOSAL

## 2023-08-31 MED ORDER — MANNITOL 25 % IV SOLN
INTRAVENOUS | Status: AC
  Filled 2023-08-31: qty 50

## 2023-08-31 MED ORDER — DIPHENHYDRAMINE HCL 50 MG/ML IJ SOLN: 12.5000 mg | Freq: Four times a day (QID) | INTRAMUSCULAR | Status: DC | PRN

## 2023-08-31 MED ORDER — HEPARIN SODIUM (PORCINE) 5000 UNIT/ML IJ SOLN
5000.0000 [IU] | Freq: Three times a day (TID) | INTRAMUSCULAR | Status: DC
  Administered 2023-08-31 – 2023-09-02 (×6): 5000 [IU] via SUBCUTANEOUS
  Filled 2023-08-31 (×6): qty 1

## 2023-08-31 MED ORDER — ACETAMINOPHEN 10 MG/ML IV SOLN: 1000.0000 mg | Freq: Once | INTRAVENOUS | Status: DC | PRN

## 2023-08-31 MED ORDER — GLYCOPYRROLATE 0.2 MG/ML IJ SOLN
INTRAMUSCULAR | Status: AC
  Filled 2023-08-31: qty 1

## 2023-08-31 MED ORDER — OXYCODONE HCL 5 MG PO TABS: 5.0000 mg | ORAL_TABLET | Freq: Once | ORAL | Status: DC | PRN

## 2023-08-31 MED ORDER — MIDAZOLAM HCL 2 MG/2ML IJ SOLN
INTRAMUSCULAR | Status: AC
  Filled 2023-08-31: qty 2

## 2023-08-31 MED ORDER — ONDANSETRON HCL 4 MG/2ML IJ SOLN: 4.0000 mg | Freq: Once | INTRAMUSCULAR | Status: DC | PRN

## 2023-08-31 MED ORDER — IRBESARTAN 150 MG PO TABS
300.0000 mg | ORAL_TABLET | Freq: Every day | ORAL | Status: DC
  Administered 2023-09-01 – 2023-09-02 (×2): 300 mg via ORAL
  Filled 2023-08-31 (×2): qty 2

## 2023-08-31 MED ORDER — CEFAZOLIN SODIUM-DEXTROSE 2-4 GM/100ML-% IV SOLN
2.0000 g | INTRAVENOUS | Status: AC
  Administered 2023-08-31: 2 g via INTRAVENOUS

## 2023-08-31 MED ORDER — SODIUM CHLORIDE 0.9 % IV SOLN: INTRAVENOUS | Status: AC

## 2023-08-31 MED ORDER — ACETAMINOPHEN 10 MG/ML IV SOLN
INTRAVENOUS | Status: AC
  Filled 2023-08-31: qty 100

## 2023-08-31 MED ORDER — DEXAMETHASONE SODIUM PHOSPHATE 10 MG/ML IJ SOLN
INTRAMUSCULAR | Status: DC | PRN
  Administered 2023-08-31: 10 mg via INTRAVENOUS

## 2023-08-31 MED ORDER — ATORVASTATIN CALCIUM 20 MG PO TABS
20.0000 mg | ORAL_TABLET | Freq: Every day | ORAL | Status: DC
  Administered 2023-09-01 – 2023-09-06 (×6): 20 mg via ORAL
  Filled 2023-08-31 (×6): qty 1

## 2023-08-31 MED ORDER — PROPOFOL 10 MG/ML IV BOLUS
INTRAVENOUS | Status: DC | PRN
  Administered 2023-08-31: 120 mg via INTRAVENOUS

## 2023-08-31 MED ORDER — ONDANSETRON HCL 4 MG/2ML IJ SOLN
INTRAMUSCULAR | Status: AC
  Filled 2023-08-31: qty 2

## 2023-08-31 MED ORDER — SODIUM CHLORIDE (PF) 0.9 % IJ SOLN
INTRAMUSCULAR | Status: AC
  Filled 2023-08-31: qty 50

## 2023-08-31 MED ORDER — PHENYLEPHRINE HCL-NACL 20-0.9 MG/250ML-% IV SOLN
INTRAVENOUS | Status: DC | PRN
  Administered 2023-08-31: 40 ug/min via INTRAVENOUS

## 2023-08-31 MED ORDER — CHLORHEXIDINE GLUCONATE 4 % EX SOLN
60.0000 mL | Freq: Once | CUTANEOUS | Status: AC
  Administered 2023-08-31: 4 via TOPICAL

## 2023-08-31 MED ORDER — DIPHENHYDRAMINE HCL 12.5 MG/5ML PO ELIX: 12.5000 mg | ORAL_SOLUTION | Freq: Four times a day (QID) | ORAL | Status: DC | PRN

## 2023-08-31 MED ORDER — ACETAMINOPHEN 10 MG/ML IV SOLN
INTRAVENOUS | Status: DC | PRN
  Administered 2023-08-31: 1000 mg via INTRAVENOUS

## 2023-08-31 MED ORDER — SODIUM CHLORIDE 0.9 % IV SOLN: INTRAVENOUS | Status: DC | PRN

## 2023-08-31 MED ORDER — MIDAZOLAM HCL 2 MG/2ML IJ SOLN
INTRAMUSCULAR | Status: DC | PRN
  Administered 2023-08-31: 1 mg via INTRAVENOUS

## 2023-08-31 MED ORDER — BUPIVACAINE LIPOSOME 1.3 % IJ SUSP
INTRAMUSCULAR | Status: DC | PRN
  Administered 2023-08-31: 50 mL via INTRAMUSCULAR

## 2023-08-31 MED ORDER — CEFAZOLIN SODIUM-DEXTROSE 1-4 GM/50ML-% IV SOLN
1.0000 g | Freq: Three times a day (TID) | INTRAVENOUS | Status: AC
  Administered 2023-08-31 – 2023-09-01 (×2): 1 g via INTRAVENOUS
  Filled 2023-08-31 (×2): qty 50

## 2023-08-31 MED ORDER — AMLODIPINE-OLMESARTAN 10-40 MG PO TABS
1.0000 | ORAL_TABLET | Freq: Every day | ORAL | Status: DC
Start: 2023-08-31 — End: 2023-08-31

## 2023-08-31 MED ORDER — FENTANYL CITRATE (PF) 100 MCG/2ML IJ SOLN
INTRAMUSCULAR | Status: AC
  Filled 2023-08-31: qty 2

## 2023-08-31 MED ORDER — SUGAMMADEX SODIUM 200 MG/2ML IV SOLN
INTRAVENOUS | Status: DC | PRN
  Administered 2023-08-31: 145.2 mg via INTRAVENOUS

## 2023-08-31 MED ORDER — ORAL CARE MOUTH RINSE: 15.0000 mL | Freq: Once | OROMUCOSAL | Status: AC

## 2023-08-31 MED ORDER — PHENYLEPHRINE HCL (PRESSORS) 10 MG/ML IV SOLN
0.0000 ug/min | INTRAVENOUS | Status: DC
  Administered 2023-08-31: 50 ug/min via INTRAVENOUS

## 2023-08-31 MED ORDER — PHENYLEPHRINE 80 MCG/ML (10ML) SYRINGE FOR IV PUSH (FOR BLOOD PRESSURE SUPPORT)
PREFILLED_SYRINGE | INTRAVENOUS | Status: DC | PRN
  Administered 2023-08-31: 160 ug via INTRAVENOUS
  Administered 2023-08-31: 80 ug via INTRAVENOUS
  Administered 2023-08-31: 160 ug via INTRAVENOUS
  Administered 2023-08-31 (×5): 80 ug via INTRAVENOUS
  Administered 2023-08-31: 160 ug via INTRAVENOUS

## 2023-08-31 SURGICAL SUPPLY — 70 items
APPLICATOR SURGIFLO ENDO (HEMOSTASIS) IMPLANT
BAG DRAIN SIEMENS DORNER NS (MISCELLANEOUS) ×2 IMPLANT
BAG LAPAROSCOPIC 12 15 PORT 16 (BASKET) ×2 IMPLANT
CHLORAPREP W/TINT 26 (MISCELLANEOUS) ×2 IMPLANT
CLEANER CAUTERY TIP PAD (MISCELLANEOUS) ×2 IMPLANT
CLIP APPLIE ROT 10 11.4 M/L (STAPLE) IMPLANT
CLIP LIGATING HEM O LOK PURPLE (MISCELLANEOUS) ×2 IMPLANT
DERMABOND ADVANCED .7 DNX12 (GAUZE/BANDAGES/DRESSINGS) ×4 IMPLANT
DRAIN CHANNEL JP 19F RND 3/16 (MISCELLANEOUS) IMPLANT
DRAPE INCISE IOBAN 66X45 STRL (DRAPES) ×2 IMPLANT
DRAPE STERI POUCH LG 24X46 STR (DRAPES) ×2 IMPLANT
DRAPE SURG 17X11 SM STRL (DRAPES) ×8 IMPLANT
DRSG TELFA 3X8 NADH STRL (GAUZE/BANDAGES/DRESSINGS) IMPLANT
ELECTRODE REM PT RTRN 9FT ADLT (ELECTROSURGICAL) ×2 IMPLANT
EVACUATOR SILICONE 100CC (DRAIN) IMPLANT
GLOVE BIO SURGEON STRL SZ 6.5 (GLOVE) ×4 IMPLANT
GLOVE BIOGEL PI IND STRL 7.5 (GLOVE) ×2 IMPLANT
GLOVE SURG UNDER LTX SZ6.5 (GLOVE) ×2 IMPLANT
GOWN STRL REUS W/ TWL LRG LVL3 (GOWN DISPOSABLE) ×4 IMPLANT
GOWN STRL REUS W/ TWL XL LVL3 (GOWN DISPOSABLE) ×2 IMPLANT
GRASPER SUT TROCAR 14GX15 (MISCELLANEOUS) ×2 IMPLANT
GUIDEWIRE STR DUAL SENSOR (WIRE) ×2 IMPLANT
HANDLE YANKAUER SUCT BULB TIP (MISCELLANEOUS) ×2 IMPLANT
HEMOSTAT SURGICEL 2X14 (HEMOSTASIS) IMPLANT
HOLDER FOLEY CATH W/STRAP (MISCELLANEOUS) ×2 IMPLANT
IRRIGATION STRYKERFLOW (MISCELLANEOUS) ×2 IMPLANT
KIT PINK PAD W/HEAD ARE REST (MISCELLANEOUS) ×2 IMPLANT
KIT PINK PAD W/HEAD ARM REST (MISCELLANEOUS) ×2 IMPLANT
KIT TURNOVER KIT A (KITS) ×2 IMPLANT
KITTNER LAPARASCOPIC 5X40 (MISCELLANEOUS) ×2 IMPLANT
LABEL OR SOLS (LABEL) ×2 IMPLANT
LHOOK LAP DISP 36CM (ELECTROSURGICAL) ×2 IMPLANT
LIGASURE LAP ATLAS 10MM 37CM (INSTRUMENTS) ×2 IMPLANT
LOOP VESSEL MAXI 1X406 RED (MISCELLANEOUS) IMPLANT
MANIFOLD NEPTUNE II (INSTRUMENTS) ×2 IMPLANT
NDL HYPO 21X1.5 SAFETY (NEEDLE) ×2 IMPLANT
NDL HYPO 25X1 1.5 SAFETY (NEEDLE) IMPLANT
NEEDLE HYPO 21X1.5 SAFETY (NEEDLE) ×2 IMPLANT
NEEDLE HYPO 25X1 1.5 SAFETY (NEEDLE) ×2 IMPLANT
PACK CYSTO AR (MISCELLANEOUS) ×2 IMPLANT
PACK LAP CHOLECYSTECTOMY (MISCELLANEOUS) ×2 IMPLANT
RELOAD STAPLE 60 2.6 WHT THN (STAPLE) IMPLANT
RELOAD STAPLER WHITE 60MM (STAPLE) ×4 IMPLANT
SCISSORS METZENBAUM CVD 33 (INSTRUMENTS) ×2 IMPLANT
SET CYSTO W/LG BORE CLAMP LF (SET/KITS/TRAYS/PACK) ×2 IMPLANT
SET TUBE SMOKE EVAC HIGH FLOW (TUBING) ×2 IMPLANT
SLEEVE Z-THREAD 12X100MM (TROCAR) ×2 IMPLANT
SOL .9 NS 3000ML IRR UROMATIC (IV SOLUTION) ×2 IMPLANT
SPONGE DRAIN TRACH 4X4 STRL 2S (GAUZE/BANDAGES/DRESSINGS) IMPLANT
SPONGE T-LAP 18X18 ~~LOC~~+RFID (SPONGE) ×2 IMPLANT
STAPLE ECHEON FLEX 60 POW ENDO (STAPLE) IMPLANT
STAPLER SKIN PROX 35W (STAPLE) ×2 IMPLANT
SURGIFLO W/THROMBIN 8M KIT (HEMOSTASIS) IMPLANT
SURGILUBE 2OZ TUBE FLIPTOP (MISCELLANEOUS) ×2 IMPLANT
SUT MNCRL AB 4-0 PS2 18 (SUTURE) ×4 IMPLANT
SUT PDS AB 1 CT1 36 (SUTURE) IMPLANT
SUT PDS AB 1 TP1 96 (SUTURE) ×2 IMPLANT
SUT VIC AB 0 CT1 36 (SUTURE) ×4 IMPLANT
SUT VIC AB 1 CT1 36 (SUTURE) IMPLANT
SUT VIC AB 2-0 SH 27XBRD (SUTURE) IMPLANT
SUT VICRYL 0 UR6 27IN ABS (SUTURE) ×2 IMPLANT
SUTURE EHLN 3-0 FS-10 30 BLK (SUTURE) IMPLANT
SYSTEM LAPSCP GELPORT 120MM (MISCELLANEOUS) ×2 IMPLANT
TRAP FLUID SMOKE EVACUATOR (MISCELLANEOUS) ×2 IMPLANT
TRAY FOLEY MTR SLVR 16FR STAT (SET/KITS/TRAYS/PACK) ×2 IMPLANT
TROCAR Z-THREAD FIOS 12X100MM (TROCAR) ×2 IMPLANT
TROCAR Z-THREAD FIOS 5X100MM (TROCAR) IMPLANT
WATER STERILE IRR 1000ML POUR (IV SOLUTION) ×2 IMPLANT
WATER STERILE IRR 3000ML UROMA (IV SOLUTION) ×2 IMPLANT
WATER STERILE IRR 500ML POUR (IV SOLUTION) ×2 IMPLANT

## 2023-08-31 NOTE — Anesthesia Postprocedure Evaluation (Signed)
 Anesthesia Post Note  Patient: Victoria Clarke  Procedure(s) Performed: NEPHRECTOMY, HAND-ASSISTED, LAPAROSCOPIC (Left: Abdomen) REMOVAL, STENT, URETER (Left: Ureter)  Patient location during evaluation: PACU Anesthesia Type: General Level of consciousness: awake and alert Pain management: pain level controlled Vital Signs Assessment: post-procedure vital signs reviewed and stable Respiratory status: spontaneous breathing, nonlabored ventilation, respiratory function stable and patient connected to nasal cannula oxygen Cardiovascular status: blood pressure returned to baseline and stable Postop Assessment: no apparent nausea or vomiting Anesthetic complications: no   No notable events documented.   Last Vitals:  Vitals:   08/31/23 1245 08/31/23 1316  BP: (!) 101/59 (!) 101/59  Pulse: 95 95  Resp:  14  Temp:  36.5 C  SpO2: 98% 95%    Last Pain:  Vitals:   08/31/23 1316  TempSrc: Oral  PainSc:                  [name]

## 2023-08-31 NOTE — Progress Notes (Signed)
 Patient hypotensive upon arrival to pacu from OR (SBP in 70's after multiple attempts). CRNA administered phenylephrine  by push, then phenylephrine  drip started at 50 mcg/min @1107 .  1126- titrated neo drip down to 40 mcg/min  1134-titrated down to 30 mcg/min

## 2023-08-31 NOTE — Anesthesia Procedure Notes (Signed)
 Procedure Name: Intubation Date/Time: 08/31/2023 8:08 AM  Performed by: Clementine Cutting, CRNAPre-anesthesia Checklist: Patient identified, Patient being monitored, Timeout performed, Emergency Drugs available and Suction available Patient Re-evaluated:Patient Re-evaluated prior to induction Oxygen Delivery Method: Circle system utilized Preoxygenation: Pre-oxygenation with 100% oxygen Induction Type: IV induction Ventilation: Mask ventilation without difficulty Laryngoscope Size: 3 and McGrath Grade View: Grade I Tube type: Oral Tube size: 6.5 mm Number of attempts: 1 Airway Equipment and Method: Stylet Placement Confirmation: ETT inserted through vocal cords under direct vision, positive ETCO2 and breath sounds checked- equal and bilateral Secured at: 21 cm Tube secured with: Tape Dental Injury: Teeth and Oropharynx as per pre-operative assessment  Comments: Blood noted to Right upper gum prior to laryngoscopy (as per pt report in pre-op), no trauma upon intubation.

## 2023-08-31 NOTE — Op Note (Signed)
 PREOP DIAGNOSIS: Left renal atrophy with chronic obstruction, pyonephrosis  POSTOPERATIVE DIAGNOSIS: Same as above  OPERATION PERFORMED: Hand-assisted laparoscopic left radical nephrectomy. Left ureteral stent removal   SURGEON: [name], MD   Assistant: Jay Meth, MD   ANESTHESIA: General.   ESTIMATED BLOOD LOSS: 100 cc   DRAINS: 16-French Foley catheter. 19 round JP  COMPLICATIONS: None.  Indications: 73 year old with minimally functioning chronically obstructed left atrophic kidney with pyelonephrosis  FINDINGS: Extremely inflamed left kidney with difficult plain differentiation secondary to perirenal pelvic rind.  Purulent drainage from a pinpoint opening in the anterior renal pelvis into the field.  Copious irrigation at the end.  Non -gonadal or adrenal vein sparing.  DESCRIPTION OF OPERATION: Informed consent was obtained. The patient was marked on the left side. IV antibiotics were given for bacterial prophylaxis on call to the operating room. SCDs were provided for DVT prophylaxis. The patient was taken to the operating room and placed supine on the operating table. General anesthesia was provided. A Foley catheter was placed to drain the bladder.  The patient was positioned in right lateral decubitus with the left flank elevated about 70 degrees and the table flexed slightly. The left arm was placed in a padded airplane for support.  No axillary roll was used as she was in sloppy flank position and there was no pressure on her axilla. The patient was secured to the table with soft straps and then prepped and draped sterilely.   We had a time-out confirming the patient identification, planned procedure, surgical site, and all present were in agreement. All present were in agreement.   A 8 cm incision was made for the hand port in the left lower quadrant. The anterior fascia was incised and elevated. The rectus belly was retracted medially  and the peritoneum was incised. An incisional block was provided with liposomal Marcaine. The GelPort was assembled. A 12 mm trocar was placed above the umbilicus, and then the abdomen was insufflated. Laparoscopic survey revealed no abnormalities or injuries. An additional 12 mm trocar was just below  the subxiphoid. All port sites were then infiltrated with liposomal Marcaine. Zero Vicryls were placed at the 12 mm trocar sites with the Carter-Thomason device for closure at the end of the case.   The white line of Toldt was incised.  The spleen was not particularly clear and the mesentery was quite adhesed to Gerota's fascia.  Ultimately was able to find the appropriate plane the colon was reflected from the spleen to the pelvis. Gerota's fascia was lifted up off the lower pole of the kidney and the ureter and gonadal vessels were identified.  Notably, there was a small mesenteric window created and paired artery and vein which appeared to be possibly accessory lower pole vessels were spared as they were in fact mesenteric vessels.  The gonadal vein was exposed and followed up to the main renal vein. The branch point of the gonadal vein and adrenal vein were exposed at the renal vein. The upper pole of the kidney was mobilized off of the quadratus lumborum and further mobilized away from the spleen. The lower pole and lateral attachments were freed.  Identified the ureter and made a small incision revealing the stent.  This was removed and passed off the field.  I then ligated the ureter and the tail of Gerota's using ligature.  The distal end of the ureter was clamped using Weck's x 2.  I also ended up taking the gonadal vein near the  tail of Gerota's as being able to create a plane between the renal vein distal to the adrenal and the left gonadal vein was not possible due to the amount of fibrosis and scarring.  The kidney was held laterally and the hilar dissection was completed.  At this  point in time, the hilum was ligated en bloc using 60 mL meter vascular stapler with both the renal artery and renal vein concomitantly.  This was on the proximal renal vein before the branches as described above.  Hemostasis was excellent.  I then addressed the medial upper pole by creating a plane at the edge of the kidney.  I ended up firing a staple load across this very fibrotic tissue and likely partially transected the adrenal gland.  Finally, I was ultimately able to completely mobilize the kidney such that I was free.   An endocatch bag was then used to bag the specimen.  The kidney was extracted through the gel port and passed off for pathological analysis.    Pneumoperitoneal pressure was reduced to 7 mmHg and the abdomen was inspected; hemostasis was confirmed.  I then irrigated the wound bed aggressively as well as the adjacent bowel several times given the spillage of purulent urine.  Surgicel and Surgiflo were placed in the adrenal bed as well as near the area of the hilum and tail of Gerota's.  I placed a 19 round Blake in the left lower quadrant under direct visualization given the concern for infection to maximize drainage.    The 12 mm trocars were removed and the port sites closed with previously placed 0 Vicryl. The Gelport was removed. The anterior fascia was closed with a running 1 PDS  The subcutaneous tissue was closed using 3-0 vicryl. All the incisions were irrigated, patted dry, and then the skin was reapproximated with 4-0 Monocryl in a subcuticular fashion. The wounds were cleaned and dried and covered with Dermabond. All sponge, needle, and instrument counts were reported correct x2.   The patient was awakened from anesthesia and transferred to recovery in stable condition. There were no complications. The patient tolerated the procedure well. ______________________________    [name], MD   An assistant was required for this surgical procedure. The duties  of the assistant included but were not limited to suctioning, passing suture, camera manipulation, retraction. This procedure would not be able to be performed without an Geophysicist/field seismologist.

## 2023-08-31 NOTE — Anesthesia Preprocedure Evaluation (Signed)
 Anesthesia Evaluation  Patient identified by MRN, date of birth, ID band Patient awake    Reviewed: Allergy & Precautions, NPO status , Patient's Chart, lab work & pertinent test results  History of Anesthesia Complications Negative for: history of anesthetic complications  Airway Mallampati: II  TM Distance: >3 FB Neck ROM: Full    Dental  (+) Edentulous Upper, Edentulous Lower Friable maxillary gums due to having teeth pulled last week.:   Pulmonary neg pulmonary ROS, neg sleep apnea, neg COPD, Patient abstained from smoking.Not current smoker, former smoker   Pulmonary exam normal breath sounds clear to auscultation       Cardiovascular Exercise Tolerance: Good METShypertension, Pt. on medications (-) CAD and (-) Past MI (-) dysrhythmias  Rhythm:Regular Rate:Normal - Systolic murmurs    Neuro/Psych negative neurological ROS  negative psych ROS   GI/Hepatic ,neg GERD  ,,(+)     (-) substance abuse    Endo/Other  neg diabetes    Renal/GU Renal disease     Musculoskeletal   Abdominal   Peds  Hematology  (+) Blood dyscrasia, anemia   Anesthesia Other Findings Past Medical History: No date: Colon polyp No date: Former smoker No date: High cholesterol No date: Hydronephrosis of left kidney No date: Hypertension No date: Left renal atrophy No date: Normocytic anemia No date: Obesity No date: Recurrent UTI (urinary tract infection) No date: Sepsis (HCC) No date: Ureteral obstruction, left  Reproductive/Obstetrics                             Anesthesia Physical Anesthesia Plan  ASA: 2  Anesthesia Plan: General   Post-op Pain Management: Ofirmev  IV (intra-op)*   Induction: Intravenous  PONV Risk Score and Plan: 4 or greater and Ondansetron , Dexamethasone  and Treatment may vary due to age or medical condition  Airway Management Planned: Oral ETT and Video Laryngoscope  Planned  Additional Equipment: None  Intra-op Plan:   Post-operative Plan: Extubation in OR  Informed Consent: I have reviewed the patients History and Physical, chart, labs and discussed the procedure including the risks, benefits and alternatives for the proposed anesthesia with the patient or authorized representative who has indicated his/her understanding and acceptance.     Dental advisory given  Plan Discussed with: CRNA and Surgeon  Anesthesia Plan Comments: (Discussed risks of anesthesia with patient, including PONV, sore throat, lip/dental/eye damage. Rare risks discussed as well, such as cardiorespiratory and neurological sequelae, and allergic reactions. Discussed the role of CRNA in patient's perioperative care. Patient understands.)       Anesthesia Quick Evaluation

## 2023-08-31 NOTE — Transfer of Care (Signed)
 Immediate Anesthesia Transfer of Care Note  Patient: Victoria Clarke  Procedure(s) Performed: NEPHRECTOMY, HAND-ASSISTED, LAPAROSCOPIC (Left: Abdomen) REMOVAL, STENT, URETER (Left: Ureter)  Patient Location: PACU  Anesthesia Type:General  Level of Consciousness: awake, alert , and oriented  Airway & Oxygen Therapy: Patient Spontanous Breathing and Patient connected to face mask oxygen  Post-op Assessment: Report given, Dr. Girard Lam to bedside, phenylephrine  given for low BP  Post vital signs: Reviewed and re-stabledwith phenylephrine .  Last Vitals:  Vitals Value Taken Time  BP 71/43 08/31/23 1053  Temp    Pulse 88 08/31/23 1057  Resp 16 08/31/23 1057  SpO2 100 % 08/31/23 1057  Vitals shown include unfiled device data.  Last Pain:  Vitals:   08/31/23 0630  TempSrc: Temporal  PainSc: 3          Complications: No notable events documented.

## 2023-08-31 NOTE — Interval H&P Note (Signed)
 History and Physical Interval Note:  08/31/2023 7:25 AM  Victoria Clarke  has presented today for surgery, with the diagnosis of Left Renal Atrophy, Left Hydronephrosis, Left Obstructing Stones.  The various methods of treatment have been discussed with the patient and family. After consideration of risks, benefits and other options for treatment, the patient has consented to  Procedure(s): NEPHRECTOMY, HAND-ASSISTED, LAPAROSCOPIC (Left) REMOVAL, STENT, URETER, CYSTOSCOPIC (Left) as a surgical intervention.  The patient's history has been reviewed, patient examined, no change in status, stable for surgery.  I have reviewed the patient's chart and labs.  Questions were answered to the patient's satisfaction.    RRR CTAB  Taking cipro    [name]

## 2023-09-01 ENCOUNTER — Inpatient Hospital Stay

## 2023-09-01 ENCOUNTER — Encounter: Payer: Self-pay | Admitting: Urology

## 2023-09-01 DIAGNOSIS — N039 Chronic nephritic syndrome with unspecified morphologic changes: Secondary | ICD-10-CM

## 2023-09-01 DIAGNOSIS — Z905 Acquired absence of kidney: Secondary | ICD-10-CM | POA: Diagnosis not present

## 2023-09-01 DIAGNOSIS — N200 Calculus of kidney: Secondary | ICD-10-CM

## 2023-09-01 DIAGNOSIS — J9601 Acute respiratory failure with hypoxia: Secondary | ICD-10-CM

## 2023-09-01 DIAGNOSIS — N2889 Other specified disorders of kidney and ureter: Secondary | ICD-10-CM

## 2023-09-01 DIAGNOSIS — A4150 Gram-negative sepsis, unspecified: Secondary | ICD-10-CM | POA: Diagnosis not present

## 2023-09-01 DIAGNOSIS — N138 Other obstructive and reflux uropathy: Secondary | ICD-10-CM

## 2023-09-01 DIAGNOSIS — N136 Pyonephrosis: Secondary | ICD-10-CM

## 2023-09-01 DIAGNOSIS — N1330 Unspecified hydronephrosis: Secondary | ICD-10-CM

## 2023-09-01 DIAGNOSIS — N009 Acute nephritic syndrome with unspecified morphologic changes: Secondary | ICD-10-CM

## 2023-09-01 DIAGNOSIS — E785 Hyperlipidemia, unspecified: Secondary | ICD-10-CM | POA: Diagnosis not present

## 2023-09-01 DIAGNOSIS — N390 Urinary tract infection, site not specified: Secondary | ICD-10-CM

## 2023-09-01 LAB — COMPREHENSIVE METABOLIC PANEL WITH GFR
ALT: 5 U/L (ref 0–44)
AST: 18 U/L (ref 15–41)
Albumin: 2 g/dL — ABNORMAL LOW (ref 3.5–5.0)
Alkaline Phosphatase: 49 U/L (ref 38–126)
Anion gap: 8 (ref 5–15)
BUN: 31 mg/dL — ABNORMAL HIGH (ref 8–23)
CO2: 20 mmol/L — ABNORMAL LOW (ref 22–32)
Calcium: 7.8 mg/dL — ABNORMAL LOW (ref 8.9–10.3)
Chloride: 106 mmol/L (ref 98–111)
Creatinine, Ser: 3.36 mg/dL — ABNORMAL HIGH (ref 0.44–1.00)
GFR, Estimated: 14 mL/min — ABNORMAL LOW (ref >60)
Glucose, Bld: 109 mg/dL — ABNORMAL HIGH (ref 70–99)
Potassium: 5 mmol/L (ref 3.5–5.1)
Sodium: 134 mmol/L — ABNORMAL LOW (ref 135–145)
Total Bilirubin: 0.4 mg/dL (ref 0.0–1.2)
Total Protein: 5.6 g/dL — ABNORMAL LOW (ref 6.5–8.1)

## 2023-09-01 LAB — CBC
HCT: 29.3 % — ABNORMAL LOW (ref 36.0–46.0)
HCT: 29.1 % — ABNORMAL LOW (ref 36.0–46.0)
Hemoglobin: 9 g/dL — ABNORMAL LOW (ref 12.0–15.0)
Hemoglobin: 8.9 g/dL — ABNORMAL LOW (ref 12.0–15.0)
MCH: 27.1 pg (ref 26.0–34.0)
MCH: 27.3 pg (ref 26.0–34.0)
MCHC: 30.7 g/dL (ref 30.0–36.0)
MCHC: 30.6 g/dL (ref 30.0–36.0)
MCV: 88.3 fL (ref 80.0–100.0)
MCV: 89.3 fL (ref 80.0–100.0)
Platelets: 312 10*3/uL (ref 150–400)
Platelets: 325 10*3/uL (ref 150–400)
RBC: 3.32 MIL/uL — ABNORMAL LOW (ref 3.87–5.11)
RBC: 3.26 MIL/uL — ABNORMAL LOW (ref 3.87–5.11)
RDW: 15.4 % (ref 11.5–15.5)
RDW: 15.5 % (ref 11.5–15.5)
WBC: 14.4 10*3/uL — ABNORMAL HIGH (ref 4.0–10.5)
WBC: 16.4 10*3/uL — ABNORMAL HIGH (ref 4.0–10.5)
nRBC: 0 % (ref 0.0–0.2)
nRBC: 0 % (ref 0.0–0.2)

## 2023-09-01 LAB — BASIC METABOLIC PANEL WITH GFR
Anion gap: 10 (ref 5–15)
Anion gap: 11 (ref 5–15)
BUN: 22 mg/dL (ref 8–23)
BUN: 31 mg/dL — ABNORMAL HIGH (ref 8–23)
CO2: 21 mmol/L — ABNORMAL LOW (ref 22–32)
CO2: 20 mmol/L — ABNORMAL LOW (ref 22–32)
Calcium: 8.1 mg/dL — ABNORMAL LOW (ref 8.9–10.3)
Calcium: 8.1 mg/dL — ABNORMAL LOW (ref 8.9–10.3)
Chloride: 105 mmol/L (ref 98–111)
Chloride: 105 mmol/L (ref 98–111)
Creatinine, Ser: 2.81 mg/dL — ABNORMAL HIGH (ref 0.44–1.00)
Creatinine, Ser: 3.22 mg/dL — ABNORMAL HIGH (ref 0.44–1.00)
GFR, Estimated: 17 mL/min — ABNORMAL LOW (ref >60)
GFR, Estimated: 15 mL/min — ABNORMAL LOW (ref >60)
Glucose, Bld: 126 mg/dL — ABNORMAL HIGH (ref 70–99)
Glucose, Bld: 104 mg/dL — ABNORMAL HIGH (ref 70–99)
Potassium: 5.3 mmol/L — ABNORMAL HIGH (ref 3.5–5.1)
Potassium: 5.3 mmol/L — ABNORMAL HIGH (ref 3.5–5.1)
Sodium: 136 mmol/L (ref 135–145)
Sodium: 136 mmol/L (ref 135–145)

## 2023-09-01 LAB — APTT: aPTT: 40 s — ABNORMAL HIGH (ref 24–36)

## 2023-09-01 LAB — CBC WITH DIFFERENTIAL/PLATELET
Abs Immature Granulocytes: 0.06 10*3/uL (ref 0.00–0.07)
Basophils Absolute: 0 10*3/uL (ref 0.0–0.1)
Basophils Relative: 0 %
Eosinophils Absolute: 0 10*3/uL (ref 0.0–0.5)
Eosinophils Relative: 0 %
HCT: 26.6 % — ABNORMAL LOW (ref 36.0–46.0)
Hemoglobin: 8.1 g/dL — ABNORMAL LOW (ref 12.0–15.0)
Immature Granulocytes: 0 %
Lymphocytes Relative: 9 %
Lymphs Abs: 1.3 10*3/uL (ref 0.7–4.0)
MCH: 26.9 pg (ref 26.0–34.0)
MCHC: 30.5 g/dL (ref 30.0–36.0)
MCV: 88.4 fL (ref 80.0–100.0)
Monocytes Absolute: 0.5 10*3/uL (ref 0.1–1.0)
Monocytes Relative: 3 %
Neutro Abs: 13.5 10*3/uL — ABNORMAL HIGH (ref 1.7–7.7)
Neutrophils Relative %: 88 %
Platelets: 296 10*3/uL (ref 150–400)
RBC: 3.01 MIL/uL — ABNORMAL LOW (ref 3.87–5.11)
RDW: 15.7 % — ABNORMAL HIGH (ref 11.5–15.5)
WBC: 15.4 10*3/uL — ABNORMAL HIGH (ref 4.0–10.5)
nRBC: 0 % (ref 0.0–0.2)

## 2023-09-01 LAB — PROTIME-INR
INR: 1.3 — ABNORMAL HIGH (ref 0.8–1.2)
Prothrombin Time: 16.3 s — ABNORMAL HIGH (ref 11.4–15.2)

## 2023-09-01 LAB — LACTIC ACID, PLASMA: Lactic Acid, Venous: 1.1 mmol/L (ref 0.5–1.9)

## 2023-09-01 LAB — BRAIN NATRIURETIC PEPTIDE: B Natriuretic Peptide: 33.4 pg/mL (ref 0.0–100.0)

## 2023-09-01 LAB — D-DIMER, QUANTITATIVE
D-Dimer, Quant: 4.91 ug{FEU}/mL — ABNORMAL HIGH (ref 0.00–0.50)
D-Dimer, Quant: 3.72 ug{FEU}/mL — ABNORMAL HIGH (ref 0.00–0.50)

## 2023-09-01 LAB — SURGICAL PATHOLOGY

## 2023-09-01 MED ORDER — VANCOMYCIN HCL IN DEXTROSE 1-5 GM/200ML-% IV SOLN: 1000.0000 mg | Freq: Once | INTRAVENOUS | Status: DC

## 2023-09-01 MED ORDER — SODIUM CHLORIDE 0.9 % IV BOLUS
500.0000 mL | Freq: Once | INTRAVENOUS | Status: AC
  Administered 2023-09-01: 500 mL via INTRAVENOUS

## 2023-09-01 MED ORDER — ENSURE ENLIVE PO LIQD
237.0000 mL | Freq: Three times a day (TID) | ORAL | Status: DC
  Administered 2023-09-02 – 2023-09-06 (×8): 237 mL via ORAL

## 2023-09-01 MED ORDER — CHLORHEXIDINE GLUCONATE CLOTH 2 % EX PADS
6.0000 | MEDICATED_PAD | Freq: Every day | CUTANEOUS | Status: DC
  Administered 2023-09-01 – 2023-09-04 (×4): 6 via TOPICAL

## 2023-09-01 MED ORDER — METRONIDAZOLE 500 MG/100ML IV SOLN
500.0000 mg | Freq: Two times a day (BID) | INTRAVENOUS | Status: DC
  Administered 2023-09-02 (×2): 500 mg via INTRAVENOUS
  Filled 2023-09-01 (×3): qty 100

## 2023-09-01 MED ORDER — CIPROFLOXACIN HCL 500 MG PO TABS
500.0000 mg | ORAL_TABLET | Freq: Every day | ORAL | Status: DC
  Administered 2023-09-02: 500 mg via ORAL
  Filled 2023-09-01: qty 1

## 2023-09-01 MED ORDER — ADULT MULTIVITAMIN W/MINERALS CH
1.0000 | ORAL_TABLET | Freq: Every day | ORAL | Status: DC
  Administered 2023-09-02 – 2023-09-06 (×5): 1 via ORAL
  Filled 2023-09-01 (×5): qty 1

## 2023-09-01 MED ORDER — VANCOMYCIN HCL 1750 MG/350ML IV SOLN
1750.0000 mg | Freq: Once | INTRAVENOUS | Status: AC
  Administered 2023-09-02: 1750 mg via INTRAVENOUS
  Filled 2023-09-01: qty 350

## 2023-09-01 MED ORDER — LACTATED RINGERS IV SOLN
150.0000 mL/h | INTRAVENOUS | Status: AC
  Administered 2023-09-02: 150 mL/h via INTRAVENOUS

## 2023-09-01 MED ORDER — SODIUM CHLORIDE 0.9 % IV BOLUS
250.0000 mL | Freq: Once | INTRAVENOUS | Status: AC
  Administered 2023-09-01: 250 mL via INTRAVENOUS

## 2023-09-01 MED ORDER — SODIUM CHLORIDE 0.9 % IV SOLN
2.0000 g | INTRAVENOUS | Status: DC
  Administered 2023-09-01: 2 g via INTRAVENOUS
  Filled 2023-09-01 (×2): qty 12.5

## 2023-09-01 MED ORDER — MIDODRINE HCL 5 MG PO TABS
10.0000 mg | ORAL_TABLET | Freq: Three times a day (TID) | ORAL | Status: DC
  Administered 2023-09-01 – 2023-09-05 (×12): 10 mg via ORAL
  Filled 2023-09-01 (×12): qty 2

## 2023-09-01 NOTE — H&P (Incomplete)
 [place]   PATIENT NAME: Victoria Clarke    MR#:  [id number]  DATE OF BIRTH:  01/02/1951  DATE OF ADMISSION:  08/31/2023  PRIMARY CARE PHYSICIAN: Carollynn Cirri, NP   Patient is coming from: ***  REQUESTING/REFERRING PHYSICIAN: ***  CHIEF COMPLAINT:  No chief complaint on file.   HISTORY OF PRESENT ILLNESS:  Victoria Clarke is a 73 y.o. female with medical history significant for ***  ED Course: *** EKG as reviewed by me : *** Imaging: *** PAST MEDICAL HISTORY:   Past Medical History:  Diagnosis Date   Colon polyp    Former smoker    High cholesterol    Hydronephrosis of left kidney    Hypertension    Left renal atrophy    Normocytic anemia    Obesity    Recurrent UTI (urinary tract infection)    Sepsis (HCC)    Ureteral obstruction, left     PAST SURGICAL HISTORY:   Past Surgical History:  Procedure Laterality Date   BREAST BIOPSY Left 04-26-14   ductal hyperplasia, 1 mm papilloma.   BREAST SURGERY Right 10/25/2009   milk duct excision in [place] , atypical duct epithelial proliferation l   CESAREAN SECTION  1981   CHOLECYSTECTOMY  2006   COLONOSCOPY  2016   Dr Felicita Horns   COLONOSCOPY WITH PROPOFOL  N/A 07/30/2022   Procedure: COLONOSCOPY WITH PROPOFOL ;  Surgeon: Luke Salaam, MD;  Location: [facility] END[facility]COPY;  Service: Gastroenterology;  Laterality: N/A;   CYSTOSCOPY W/ URETERAL STENT PLACEMENT Left 07/22/2023   Procedure: CYSTOSCOPY, WITH RETROGRADE PYELOGRAM AND URETERAL STENT INSERTION;  Surgeon: [name], MD;  Location: [facility] ORS;  Service: Urology;  Laterality: Left;   CYSTOSCOPY W/ URETERAL STENT REMOVAL Left 08/31/2023   Procedure: REMOVAL, STENT, URETER;  Surgeon: [name], MD;  Location: [facility] ORS;  Service: Urology;  Laterality: Left;   EXCISION / BIOPSY BREAST / NIPPLE / DUCT Right 2011   duct excision   LAPAROSCOPIC NEPHRECTOMY, HAND ASSISTED Left 08/31/2023   Procedure: NEPHRECTOMY, HAND-ASSISTED, LAPAROSCOPIC;  Surgeon: [name], MD;  Location: [facility] ORS;  Service: Urology;  Laterality: Left;    SOCIAL HISTORY:   Social History   Tobacco Use   Smoking status: Former    Current packs/day: 0.00    Average packs/day: 1 pack/day for 30.0 years (30.0 ttl pk-yrs)    Types: Cigarettes    Start date: 04/28/1969    Quit date: 04/29/1999    Years since quitting: 24.3   Smokeless tobacco: Never  Substance Use Topics   Alcohol use: No    FAMILY HISTORY:   Family History  Problem Relation Age of Onset   Breast cancer Mother 35       Low grade DCIS   Dementia Mother    Dementia Father    Colon polyps Father    Breast cancer Sister 1   Healthy Sister    Cancer Maternal Aunt        breast/great Aunt    DRUG ALLERGIES:  No Known Allergies  REVIEW OF SYSTEMS:   ROS As per history of present illness. All pertinent systems were reviewed above. Constitutional, HEENT, cardiovascular, respiratory, GI, GU, musculoskeletal, neuro, psychiatric, endocrine, integumentary and hematologic systems were reviewed and are otherwise negative/unremarkable except for positive findings mentioned above in the HPI.   MEDICATIONS AT HOME:   Prior to Admission medications   Medication Sig Start Date End Date Taking? Authorizing Provider  amLODipine -olmesartan  (AZOR ) 10-40 MG tablet TAKE 1  TABLET DAILY Patient taking differently: Take 1 tablet by mouth every morning. 12/24/22  Yes Baity, Rankin Buzzard, NP  atorvastatin  (LIPITOR) 20 MG tablet TAKE 1 TABLET DAILY Patient taking differently: Take 20 mg by mouth every morning. 06/23/23  Yes Baity, Rankin Buzzard, NP  ciprofloxacin  (CIPRO ) 500 MG tablet Take 1 tablet (500 mg total) by mouth every 12 (twelve) hours. 08/28/23  Yes [name], MD  ondansetron  (ZOFRAN -ODT) 4 MG disintegrating tablet Take 1 tablet (4 mg total) by mouth every 8 (eight) hours as needed for nausea or vomiting. 08/17/23  Yes Stoioff, Kizzie Perks, MD  tamsulosin  (FLOMAX ) 0.4 MG CAPS capsule Take 1 capsule (0.4 mg total) by  mouth daily after breakfast. 07/25/23  Yes Melvinia Stager, MD      VITAL SIGNS:  Blood pressure (!) 103/54, pulse 92, temperature 98.6 F (37 C), temperature source Oral, resp. rate 16, height 5\' 4"  (1.626 m), weight 77.7 kg, SpO2 95%.  PHYSICAL EXAMINATION:  Physical Exam  GENERAL:  73 y.o.-year-old patient lying in the bed with no acute distress.  EYES: Pupils equal, round, reactive to light and accommodation. No scleral icterus. Extraocular muscles intact.  HEENT: Head atraumatic, normocephalic. Oropharynx and nasopharynx clear.  NECK:  Supple, no jugular venous distention. No thyroid enlargement, no tenderness.  LUNGS: Normal breath sounds bilaterally, no wheezing, rales,rhonchi or crepitation. No use of accessory muscles of respiration.  CARDIOVASCULAR: Regular rate and rhythm, S1, S2 normal. No murmurs, rubs, or gallops.  ABDOMEN: Soft, nondistended, nontender. Bowel sounds present. No organomegaly or mass.  EXTREMITIES: No pedal edema, cyanosis, or clubbing.  NEUROLOGIC: Cranial nerves II through XII are intact. Muscle strength 5/5 in all extremities. Sensation intact. Gait not checked.  PSYCHIATRIC: The patient is alert and oriented x 3.  Normal affect and good eye contact. SKIN: No obvious rash, lesion, or ulcer.   LABORATORY PANEL:   CBC Recent Labs  Lab 09/01/23 2221  WBC 15.4*  HGB 8.1*  HCT 26.6*  PLT 296   ------------------------------------------------------------------------------------------------------------------  Chemistries  Recent Labs  Lab 09/01/23 1732  NA 136  K 5.3*  CL 105  CO2 20*  GLUCOSE 104*  BUN 31*  CREATININE 3.22*  CALCIUM  8.1*   ------------------------------------------------------------------------------------------------------------------  Cardiac Enzymes No results for input(s): "TROPONINI" in the last 168  hours. ------------------------------------------------------------------------------------------------------------------  RADIOLOGY:  DG Chest 1 View Result Date: 09/01/2023 CLINICAL DATA:  Hypoxia EXAM: CHEST  1 VIEW COMPARISON:  None available FINDINGS: Heart and mediastinal contours within normal limits. Aortic atherosclerosis. Left basilar opacity, favor atelectasis. Free air under the right hemidiaphragm. No acute bony abnormality. IMPRESSION: Left basilar opacity, likely atelectasis. Pneumoperitoneum. Recommend clinical correlation for recent surgery. Electronically Signed   By: Janeece Mechanic M.D.   On: 09/01/2023 22:30      IMPRESSION AND PLAN:  Assessment and Plan: No notes have been filed under this hospital service. Service: Hospitalist      DVT prophylaxis: Lovenox ***  Advanced Care Planning:  Code Status: full code***  Family Communication:  The plan of care was discussed in details with the patient (and family). I answered all questions. The patient agreed to proceed with the above mentioned plan. Further management will depend upon hospital course. Disposition Plan: Back to previous home environment Consults called: none***  All the records are reviewed and case discussed with ED provider.  Status is: Inpatient {Inpatient:23812}   At the time of the admission, it appears that the appropriate admission status for this patient is inpatient.  This is judged to  be reasonable and necessary in order to provide the required intensity of service to ensure the patient's safety given the presenting symptoms, physical exam findings and initial radiographic and laboratory data in the context of comorbid conditions.  The patient requires inpatient status due to high intensity of service, high risk of further deterioration and high frequency of surveillance required.  I certify that at the time of admission, it is my clinical judgment that the patient will require inpatient hospital  care extending more than 2 midnights.                            Dispo: The patient is from: Home              Anticipated d/c is to: Home              Patient currently is not medically stable to d/c.              Difficult to place patient: No  Virgene Griffin M.D on 09/01/2023 at 11:22 PM  [facility]   From 7 PM-7 AM, contact night-coverage [email or web address]  CC: Primary care physician; Carollynn Cirri, NP

## 2023-09-01 NOTE — Progress Notes (Signed)
 Initial Nutrition Assessment  DOCUMENTATION CODES:   Not applicable  INTERVENTION:     NUTRITION DIAGNOSIS:   Increased nutrient needs related to post-op healing as evidenced by estimated needs.  GOAL:   Patient will meet greater than or equal to 90% of their needs  MONITOR:   PO intake, Supplement acceptance, Labs, Weight trends, I & O's, Skin  REASON FOR ASSESSMENT:   Malnutrition Screening Tool    ASSESSMENT:   73 y/o female with h/o HTN, recurrent UTI, nephrolithiasis and left renal atrophy with chronic obstruction/pyonephrosis now s/p hand-assisted laparoscopic left radical nephrectomy and left ureteral stent removal 4/5.  Met with pt in room today. Pt reports poor appetite and oral intake for the past month r/t nausea and vomiting secondary to a kidney infection. Pt reports that she believes she has lost ~14lbs over the past month. Pt reports that her appetite and oral intake is improving. Pt reports eating only a few bites of rice for lunch today. RD discussed with pt the importance of adequate nutrition needed to preserve lean muscle and to support post op healing. Pt is agreeable to drinking chocolate Ensure. RD will add supplements and MVI to help pt meet her estimated needs. Pt is at high refeed risk. Per chart, pt appears to be down ~8lbs(4%) over the past 4 months; this is not significant.     Medications reviewed and include: ciprofloxacin , colace, heparin  Labs reviewed: K 5.3(H), creat 2.81(H) Wbc- 14.4(H), Hgb 9.0(L), Hct 29.3(L)  NUTRITION - FOCUSED PHYSICAL EXAM:  Flowsheet Row Most Recent Value  Orbital Region No depletion  Upper Arm Region Mild depletion  Thoracic and Lumbar Region No depletion  Buccal Region No depletion  Temple Region No depletion  Clavicle Bone Region Mild depletion  Clavicle and Acromion Bone Region Mild depletion  Scapular Bone Region No depletion  Dorsal Hand No depletion  Patellar Region Moderate depletion  Anterior Thigh  Region No depletion  Posterior Calf Region No depletion  Edema (RD Assessment) None  Hair Reviewed  Eyes Reviewed  Mouth Reviewed  Skin Reviewed  Nails Reviewed   Diet Order:   Diet Order             Diet regular Room service appropriate? Yes; Fluid consistency: Thin  Diet effective now                  EDUCATION NEEDS:   Education needs have been addressed  Skin:  Skin Assessment: Reviewed RN Assessment (incision abdomen)  Last BM:  pta  Height:   Ht Readings from Last 1 Encounters:  08/31/23 5\' 4"  (1.626 m)    Weight:   Wt Readings from Last 1 Encounters:  09/01/23 77.7 kg    Ideal Body Weight:  54.5 kg  BMI:  Body mass index is 29.4 kg/m.  Estimated Nutritional Needs:   Kcal:  1700-1900kcal/day  Protein:  85-95g/day  Fluid:  1.5-1.7L/day  Torrance Freestone MS, RD, LDN If unable to be reached, please send secure chat to "RD inpatient" available from 8:00a-4:00p daily

## 2023-09-01 NOTE — TOC CM/SW Note (Signed)
 Transition of Care [facility]) - Inpatient Brief Assessment   Patient Details  Name: Victoria Clarke MRN: [id number] Date of Birth: 05-27-1950  Transition of Care [facility]) CM/SW Contact:    Loman Risk, RN Phone Number: 09/01/2023, 9:36 AM   Clinical Narrative:  Transition of Care [facility]) Screening Note   Patient Details  Name: Victoria Clarke Date of Birth: June 20, 1950   Transition of Care [facility]) CM/SW Contact:    Loman Risk, RN Phone Number: 09/01/2023, 9:36 AM    Transition of Care Department [facility]) has reviewed patient and no TOC needs have been identified at this time. If new patient transition needs arise, please place a TOC consult.     Transition of Care Asessment: Insurance and Status: Insurance coverage has been reviewed Patient has primary care physician: Yes     Prior/Current Home Services: No current home services Social Drivers of Health Review: SDOH reviewed no interventions necessary Readmission risk has been reviewed: Yes Transition of care needs: no transition of care needs at this time

## 2023-09-01 NOTE — Progress Notes (Signed)
 PHARMACY NOTE:  ANTIMICROBIAL RENAL DOSAGE ADJUSTMENT  Current antimicrobial regimen includes a mismatch between antimicrobial dosage and estimated renal function.  As per policy approved by the Pharmacy & Therapeutics and Medical Executive Committees, the antimicrobial dosage will be adjusted accordingly.  Current antimicrobial dosage:  ciprofloxacin  500 mg po BID  Indication: UTI  Renal Function:  Estimated Creatinine Clearance: 17.4 mL/min (A) (by C-G formula based on SCr of 2.81 mg/dL (H)).    Antimicrobial dosage has been changed to:  ciprofloxacin  500 mg po once daily   Thank you for allowing pharmacy to be a part of this patient's care.  Adalberto Acton, [facility] 09/01/2023 8:10 AM

## 2023-09-01 NOTE — Progress Notes (Signed)
 Urology Inpatient Progress Note  Subjective: No acute events overnight.  She has been afebrile and had some hypotension postop.  BP remains soft this morning. Hemoglobin down today, 9.0.  WBC count down, 14.4.  Creatinine up, 2.81. Foley catheter in place draining cloudy, yellow urine. LLQ JP drain in place draining serosanguineous fluid, 100 mL output overnight. She reports some rib cage discomfort but overall her pain is rather well-controlled.  No acute concerns this morning, no chest pain or shortness of breath.  She is tolerating clears without nausea or vomiting.  Anti-infectives: Anti-infectives (From admission, onward)    Start     Dose/Rate Route Frequency Ordered Stop   08/31/23 2200  ciprofloxacin  (CIPRO ) tablet 500 mg        500 mg Oral Every 12 hours 08/31/23 1252     08/31/23 1600  ceFAZolin (ANCEF) IVPB 1 g/50 mL premix        1 g 100 mL/hr over 30 Minutes Intravenous Every 8 hours 08/31/23 1252 09/01/23 0047   08/31/23 0619  ceFAZolin (ANCEF) IVPB 2g/100 mL premix        2 g 200 mL/hr over 30 Minutes Intravenous 30 min pre-op 08/31/23 5621 08/31/23 3086       Current Facility-Administered Medications  Medication Dose Route Frequency Provider Last Rate Last Admin   0.9 %  sodium chloride  infusion   Intravenous Continuous [name], MD 125 mL/hr at 09/01/23 0523 Infusion Verify at 09/01/23 0523   acetaminophen  (TYLENOL ) tablet 650 mg  650 mg Oral Q4H PRN [name], MD       amLODipine  (NORVASC) tablet 10 mg  10 mg Oral Daily Dorothe Gaster F, RPH       And   irbesartan (AVAPRO) tablet 300 mg  300 mg Oral Daily Greenwood, Howard F, RPH       atorvastatin  (LIPITOR) tablet 20 mg  20 mg Oral Daily [name], MD       ciprofloxacin  (CIPRO ) tablet 500 mg  500 mg Oral Q12H [name], MD   500 mg at 08/31/23 2134   diphenhydrAMINE (BENADRYL) injection 12.5 mg  12.5 mg Intravenous Q6H PRN [name], MD       Or   diphenhydrAMINE (BENADRYL)  12.5 MG/5ML elixir 12.5 mg  12.5 mg Oral Q6H PRN [name], MD       docusate sodium (COLACE) capsule 100 mg  100 mg Oral BID Brandon, Ashley, MD   100 mg at 08/31/23 2134   heparin injection 5,000 Units  5,000 Units Subcutaneous Q8H [name], MD   5,000 Units at 09/01/23 5784   morphine  (PF) 2 MG/ML injection 2-4 mg  2-4 mg Intravenous Q2H PRN [name], MD   4 mg at 08/31/23 1512   ondansetron  (ZOFRAN ) injection 4 mg  4 mg Intravenous Q4H PRN [name], MD   4 mg at 08/31/23 1303   ondansetron  (ZOFRAN -ODT) disintegrating tablet 4 mg  4 mg Oral Q8H PRN [name], MD       oxybutynin (DITROPAN) tablet 5 mg  5 mg Oral Q8H PRN [name], MD       oxyCODONE -acetaminophen  (PERCOCET/ROXICET) 5-325 MG per tablet 1-2 tablet  1-2 tablet Oral Q4H PRN [name], MD   2 tablet at 09/01/23 0018   sodium chloride  0.9 % bolus 500 mL  500 mL Intravenous Once Elisah Parmer, PA-C       Objective: Vital signs in last 24 hours: Temp:  [97.1 F (36.2 C)-98.8 F (37.1 C)] 98.8  F (37.1 C) (05/06 0803) Pulse Rate:  [89-102] 90 (05/06 0803) Resp:  [12-21] 16 (05/06 0803) BP: (71-114)/(43-68) 101/54 (05/06 0803) SpO2:  [94 %-100 %] 94 % (05/06 0803) Weight:  [72.8 kg] 72.8 kg (05/05 1316)  Intake/Output from previous day: 05/05 0701 - 05/06 0700 In: 3594 [I.V.:3344] Out: 450 [Urine:250; Drains:100; Blood:100] Intake/Output this shift: No intake/output data recorded.  Physical Exam Vitals and nursing note reviewed.  Constitutional:      General: She is not in acute distress.    Appearance: She is not ill-appearing, toxic-appearing or diaphoretic.  HENT:     Head: Normocephalic and atraumatic.  Pulmonary:     Effort: Pulmonary effort is normal. No respiratory distress.  Abdominal:     General: There is no distension.     Palpations: Abdomen is soft.     Tenderness: There is no rebound.     Comments: Incisions clean/dry/intact with overlying  adhesive.  Skin:    General: Skin is warm and dry.  Neurological:     Mental Status: She is alert and oriented to person, place, and time.  Psychiatric:        Mood and Affect: Mood normal.        Behavior: Behavior normal.    Lab Results:  Recent Labs    09/01/23 0459  WBC 14.4*  HGB 9.0*  HCT 29.3*  PLT 312   BMET Recent Labs    09/01/23 0459  NA 136  K 5.3*  CL 105  CO2 21*  GLUCOSE 126*  BUN 22  CREATININE 2.81*  CALCIUM  8.1*   Assessment & Plan: 73 year old female with PMH left renal atrophy with chronic obstruction and pyonephrosis now POD 1 from hand-assisted laparoscopic left radical nephrectomy with left ureteral stent removal with Dr. Ace Holder.  She is recovering well this morning, however we will plan to keep her overnight in light of some soft BPs and AKI.  Will plan for fluid bolus this morning.  She is in agreement.  Plan: -Advance diet as tolerated (ordered) -Continue Foley and JP and monitor I/O -Ambulate with nursing -Pain control -Adjust Cipro  dose per renal function per pharmacy - NS bolus (ordered) -Repeat labs in AM (ordered)  Kathreen Pare, PA-C 09/01/2023

## 2023-09-01 NOTE — Plan of Care (Signed)

## 2023-09-02 ENCOUNTER — Inpatient Hospital Stay

## 2023-09-02 DIAGNOSIS — A4150 Gram-negative sepsis, unspecified: Principal | ICD-10-CM

## 2023-09-02 DIAGNOSIS — J9601 Acute respiratory failure with hypoxia: Secondary | ICD-10-CM

## 2023-09-02 DIAGNOSIS — Z905 Acquired absence of kidney: Secondary | ICD-10-CM

## 2023-09-02 DIAGNOSIS — N390 Urinary tract infection, site not specified: Secondary | ICD-10-CM | POA: Diagnosis not present

## 2023-09-02 DIAGNOSIS — E785 Hyperlipidemia, unspecified: Secondary | ICD-10-CM | POA: Insufficient documentation

## 2023-09-02 HISTORY — DX: Acquired absence of kidney: Z90.5

## 2023-09-02 LAB — BASIC METABOLIC PANEL WITH GFR
Anion gap: 8 (ref 5–15)
BUN: 33 mg/dL — ABNORMAL HIGH (ref 8–23)
CO2: 19 mmol/L — ABNORMAL LOW (ref 22–32)
Calcium: 7.6 mg/dL — ABNORMAL LOW (ref 8.9–10.3)
Chloride: 107 mmol/L (ref 98–111)
Creatinine, Ser: 3.28 mg/dL — ABNORMAL HIGH (ref 0.44–1.00)
GFR, Estimated: 14 mL/min — ABNORMAL LOW (ref >60)
Glucose, Bld: 89 mg/dL (ref 70–99)
Potassium: 5.1 mmol/L (ref 3.5–5.1)
Sodium: 134 mmol/L — ABNORMAL LOW (ref 135–145)

## 2023-09-02 LAB — MAGNESIUM: Magnesium: 1.9 mg/dL (ref 1.7–2.4)

## 2023-09-02 LAB — CBC
HCT: 25 % — ABNORMAL LOW (ref 36.0–46.0)
Hemoglobin: 7.4 g/dL — ABNORMAL LOW (ref 12.0–15.0)
MCH: 26.5 pg (ref 26.0–34.0)
MCHC: 29.6 g/dL — ABNORMAL LOW (ref 30.0–36.0)
MCV: 89.6 fL (ref 80.0–100.0)
Platelets: 226 10*3/uL (ref 150–400)
RBC: 2.79 MIL/uL — ABNORMAL LOW (ref 3.87–5.11)
RDW: 15.5 % (ref 11.5–15.5)
WBC: 13.6 10*3/uL — ABNORMAL HIGH (ref 4.0–10.5)
nRBC: 0 % (ref 0.0–0.2)

## 2023-09-02 LAB — PHOSPHORUS: Phosphorus: 5.2 mg/dL — ABNORMAL HIGH (ref 2.5–4.6)

## 2023-09-02 LAB — LACTIC ACID, PLASMA: Lactic Acid, Venous: 1 mmol/L (ref 0.5–1.9)

## 2023-09-02 MED ORDER — VANCOMYCIN VARIABLE DOSE PER UNSTABLE RENAL FUNCTION (PHARMACIST DOSING): Status: DC

## 2023-09-02 MED ORDER — TECHNETIUM TO 99M ALBUMIN AGGREGATED
4.0000 | Freq: Once | INTRAVENOUS | Status: AC | PRN
Start: 2023-09-02 — End: 2023-09-02
  Administered 2023-09-02: 4.24 via INTRAVENOUS

## 2023-09-02 MED ORDER — SODIUM BICARBONATE 650 MG PO TABS
650.0000 mg | ORAL_TABLET | Freq: Two times a day (BID) | ORAL | Status: DC
  Administered 2023-09-02 – 2023-09-05 (×6): 650 mg via ORAL
  Filled 2023-09-02 (×6): qty 1

## 2023-09-02 MED ORDER — SODIUM CHLORIDE 0.9 % IV BOLUS
500.0000 mL | Freq: Once | INTRAVENOUS | Status: AC
  Administered 2023-09-02: 500 mL via INTRAVENOUS

## 2023-09-02 MED ORDER — HEPARIN SODIUM (PORCINE) 5000 UNIT/ML IJ SOLN
5000.0000 [IU] | Freq: Three times a day (TID) | INTRAMUSCULAR | Status: DC
  Administered 2023-09-02 – 2023-09-06 (×11): 5000 [IU] via SUBCUTANEOUS
  Filled 2023-09-02 (×11): qty 1

## 2023-09-02 MED ORDER — CIPROFLOXACIN IN D5W 400 MG/200ML IV SOLN
400.0000 mg | INTRAVENOUS | Status: DC
  Administered 2023-09-02: 400 mg via INTRAVENOUS
  Filled 2023-09-02 (×2): qty 200

## 2023-09-02 NOTE — Plan of Care (Signed)

## 2023-09-02 NOTE — Progress Notes (Signed)
 Pharmacy Antibiotic Note  Victoria Clarke is a 73 y.o. female admitted on 08/31/2023 with infection of unknown source.  Pharmacy has been consulted for Vancomycin dosing.  Pt in AKI, current SrCr 3X baseline.  Will dose vanc by levels until renal function improves.   Plan: Cefepime  2 gm IV Q24H ordered to start on 5/6 @ 2328.  Vancomycin 1750 mg IV X 1 loading dose given on 5/7 @ 0051.  - pt in AKI so will dose by levels - will draw Vanc trough on 5/8 @ 0100.  Goal trough :  15 - 20 mcg/mL    Height: 5\' 4"  (162.6 cm) Weight: 77.7 kg (171 lb 4.8 oz) IBW/kg (Calculated) : 54.7  Temp (24hrs), Avg:98.2 F (36.8 C), Min:97.5 F (36.4 C), Max:98.8 F (37.1 C)  Recent Labs  Lab 09/01/23 0459 09/01/23 1732 09/01/23 2221 09/01/23 2351  WBC 14.4* 16.4* 15.4*  --   CREATININE 2.81* 3.22* 3.36*  --   LATICACIDVEN  --   --  1.1 1.0    Estimated Creatinine Clearance: 15 mL/min (A) (by C-G formula based on SCr of 3.36 mg/dL (H)).    No Known Allergies  Antimicrobials this admission:   >>    >>   Dose adjustments this admission:   Microbiology results:  BCx:   UCx:    Sputum:    MRSA PCR:   Thank you for allowing pharmacy to be a part of this patient's care.  Atzel Mccambridge D 09/02/2023 5:03 AM

## 2023-09-02 NOTE — Progress Notes (Signed)
 Urology Consult Follow Up  Subjective: Patient with incisional tenderness in the left lower quadrant.  She has been afebrile, but slightly tachypneic with a soft BP.   Hemoglobin down today, 7.4.  WBC count down, 13.6, serum creatinine up, 3.28.  Phosphorus at 5.2 Foley in place draining clear yellow urine, daily output of 455 cc. JP drain in the left lower quadrant draining serosanguineous fluid, with a daily output of 160 cc She denies any chest pain or shortness of breath She is tolerating clears without nausea or vomiting She is tolerating standing and transferring, but has not had significant ambulation since admission  Anti-infectives: Anti-infectives (From admission, onward)    Start     Dose/Rate Route Frequency Ordered Stop   09/01/23 2300  vancomycin (VANCOREADY) IVPB 1750 mg/350 mL        1,750 mg 175 mL/hr over 120 Minutes Intravenous  Once 09/01/23 2211 09/02/23 0251   09/01/23 2245  metroNIDAZOLE (FLAGYL) IVPB 500 mg        500 mg 100 mL/hr over 60 Minutes Intravenous Every 12 hours 09/01/23 2158 09/08/23 2244   09/01/23 2245  vancomycin (VANCOCIN) IVPB 1000 mg/200 mL premix  Status:  Discontinued        1,000 mg 200 mL/hr over 60 Minutes Intravenous  Once 09/01/23 2158 09/01/23 2211   09/01/23 2215  ceFEPIme  (MAXIPIME ) 2 g in sodium chloride  0.9 % 100 mL IVPB        2 g 200 mL/hr over 30 Minutes Intravenous Every 24 hours 09/01/23 2158     09/01/23 1200  ciprofloxacin  (CIPRO ) tablet 500 mg        500 mg Oral Daily with breakfast 09/01/23 1038     08/31/23 2200  ciprofloxacin  (CIPRO ) tablet 500 mg  Status:  Discontinued        500 mg Oral Every 12 hours 08/31/23 1252 09/01/23 1038   08/31/23 1600  ceFAZolin (ANCEF) IVPB 1 g/50 mL premix        1 g 100 mL/hr over 30 Minutes Intravenous Every 8 hours 08/31/23 1252 09/01/23 0100   08/31/23 0619  ceFAZolin (ANCEF) IVPB 2g/100 mL premix        2 g 200 mL/hr over 30 Minutes Intravenous 30 min pre-op 08/31/23 1610 08/31/23  0828       Current Facility-Administered Medications  Medication Dose Route Frequency Provider Last Rate Last Admin   acetaminophen  (TYLENOL ) tablet 650 mg  650 mg Oral Q4H PRN [name], MD   650 mg at 09/02/23 9604   amLODipine  (NORVASC) tablet 10 mg  10 mg Oral Daily Greenwood, Howard F, RPH   10 mg at 09/01/23 1042   And   irbesartan (AVAPRO) tablet 300 mg  300 mg Oral Daily Ramonita Burow, RPH   300 mg at 09/01/23 1041   atorvastatin  (LIPITOR) tablet 20 mg  20 mg Oral Daily [name], MD   20 mg at 09/01/23 1041   ceFEPIme  (MAXIPIME ) 2 g in sodium chloride  0.9 % 100 mL IVPB  2 g Intravenous Q24H Mansy, Jan A, MD 200 mL/hr at 09/01/23 2328 2 g at 09/01/23 2328   Chlorhexidine Gluconate Cloth 2 % PADS 6 each  6 each Topical Daily [name], MD   6 each at 09/01/23 1544   ciprofloxacin  (CIPRO ) tablet 500 mg  500 mg Oral Q breakfast Grubb, Rodney D, RPH       diphenhydrAMINE (BENADRYL) injection 12.5 mg  12.5 mg Intravenous Q6H PRN [name], MD  Or   diphenhydrAMINE (BENADRYL) 12.5 MG/5ML elixir 12.5 mg  12.5 mg Oral Q6H PRN [name], MD       docusate sodium (COLACE) capsule 100 mg  100 mg Oral BID Brandon, Ashley, MD   100 mg at 09/01/23 2323   feeding supplement (ENSURE ENLIVE / ENSURE PLUS) liquid 237 mL  237 mL Oral TID BM [name], MD       heparin injection 5,000 Units  5,000 Units Subcutaneous Q8H [name], MD   5,000 Units at 09/02/23 0622   lactated ringers  infusion  150 mL/hr Intravenous Continuous Mansy, Jan A, MD 150 mL/hr at 09/02/23 0002 150 mL/hr at 09/02/23 0002   metroNIDAZOLE (FLAGYL) IVPB 500 mg  500 mg Intravenous Q12H Mansy, Jan A, MD 100 mL/hr at 09/02/23 0003 500 mg at 09/02/23 0003   midodrine (PROAMATINE) tablet 10 mg  10 mg Oral TID WC Mansy, Jan A, MD   10 mg at 09/01/23 2323   morphine  (PF) 2 MG/ML injection 2-4 mg  2-4 mg Intravenous Q2H PRN [name], MD   4 mg at 08/31/23 1512   multivitamin with  minerals tablet 1 tablet  1 tablet Oral Daily [name], MD       ondansetron  (ZOFRAN ) injection 4 mg  4 mg Intravenous Q4H PRN [name], MD   4 mg at 08/31/23 1303   ondansetron  (ZOFRAN -ODT) disintegrating tablet 4 mg  4 mg Oral Q8H PRN [name], MD       oxybutynin (DITROPAN) tablet 5 mg  5 mg Oral Q8H PRN [name], MD       oxyCODONE -acetaminophen  (PERCOCET/ROXICET) 5-325 MG per tablet 1-2 tablet  1-2 tablet Oral Q4H PRN [name], MD   2 tablet at 09/01/23 1050     Objective: Vital signs in last 24 hours: Temp:  [97.5 F (36.4 C)-98.8 F (37.1 C)] 98 F (36.7 C) (05/07 0558) Pulse Rate:  [90-107] 98 (05/07 0558) Resp:  [16-20] 20 (05/07 0558) BP: (75-103)/(47-54) 100/54 (05/07 0558) SpO2:  [92 %-99 %] 99 % (05/07 0558) Weight:  [77.7 kg] 77.7 kg (05/06 1512)  Intake/Output from previous day: 05/06 0701 - 05/07 0700 In: 2248.6 [P.O.:200; I.V.:1498.6; IV Piggyback:550] Out: 615 [Urine:455; Drains:160] Intake/Output this shift: No intake/output data recorded.   Physical Exam Vitals and nursing note reviewed.  Constitutional:      General: She is not in acute distress.    Appearance: Normal appearance. She is normal weight. She is not ill-appearing, toxic-appearing or diaphoretic.  Abdominal:     Comments: Surgical incisions are clean and dry with some mild bruising.  Left lower quadrant is tender to palpation, but no ecchymosis, crepitus or fluctuance noted.  JP drain in place draining serosanguineous fluid. 20 cc of fluid currently in the bulb.  Genitourinary:    Comments: Foley catheter in place draining clear yellow urine.   Musculoskeletal:        General: Normal range of motion.  Skin:    General: Skin is warm and dry.  Neurological:     General: No focal deficit present.     Mental Status: She is alert and oriented to person, place, and time.  Psychiatric:        Mood and Affect: Mood normal.        Behavior: Behavior normal.         Thought Content: Thought content normal.        Judgment: Judgment normal.     Lab Results:  Recent  Labs    09/01/23 2221 09/02/23 0414  WBC 15.4* 13.6*  HGB 8.1* 7.4*  HCT 26.6* 25.0*  PLT 296 226   BMET Recent Labs    09/01/23 2221 09/02/23 0414  NA 134* 134*  K 5.0 5.1  CL 106 107  CO2 20* 19*  GLUCOSE 109* 89  BUN 31* 33*  CREATININE 3.36* 3.28*  CALCIUM  7.8* 7.6*   PT/INR Recent Labs    09/01/23 2221  LABPROT 16.3*  INR 1.3*   ABG No results for input(s): "PHART", "HCO3" in the last 72 hours.  Invalid input(s): "PCO2", "PO2"  Studies/Results: DG Chest 1 View Result Date: 09/01/2023 CLINICAL DATA:  Hypoxia EXAM: CHEST  1 VIEW COMPARISON:  None available FINDINGS: Heart and mediastinal contours within normal limits. Aortic atherosclerosis. Left basilar opacity, favor atelectasis. Free air under the right hemidiaphragm. No acute bony abnormality. IMPRESSION: Left basilar opacity, likely atelectasis. Pneumoperitoneum. Recommend clinical correlation for recent surgery. Electronically Signed   By: Janeece Mechanic M.D.   On: 09/01/2023 22:30     Assessment and Plan: 73 year old female with past medical history of left renal atrophy with chronic obstruction now POD # 2 from hand-assisted laparoscopic left radical nephrectomy with left ureteral stent removal with Dr. Ace Holder.  Hospitalist service consulted yesterday after experiencing worsening creatinine, leukocytosis, tachycardia and hypotension secondary to sepsis.  VQ scan pending for further evaluation of elevated D-dimer and hypoxia.  Plan:   - Appreciate hospitalist input - VQ scan is ordered for this a.m. - Continue empiric IV antibiotic therapy with vancomycin, cefepime  and Flagyl - Advance diet as tolerated - Continue Foley and JP and monitor I/O - Pain controlled - If VQ scan does not demonstrate any findings suspicious for PE, would encourage ambulation and incentive spirometry - Continue DVT  prophylaxis - Avoid nephrotoxic agents    LOS: 2 days    [facility] 09/02/2023

## 2023-09-02 NOTE — Plan of Care (Signed)
  Problem: Education: Goal: Knowledge of General Education information will improve Description: Including pain rating scale, medication(s)/side effects and non-pharmacologic comfort measures Outcome: Progressing   Problem: Health Behavior/Discharge Planning: Goal: Ability to manage health-related needs will improve Outcome: Progressing   Problem: Clinical Measurements: Goal: Ability to maintain clinical measurements within normal limits will improve Outcome: Progressing Goal: Will remain free from infection Outcome: Progressing Goal: Diagnostic test results will improve Outcome: Progressing Goal: Respiratory complications will improve Outcome: Progressing Goal: Cardiovascular complication will be avoided Outcome: Progressing   Problem: Activity: Goal: Risk for activity intolerance will decrease Outcome: Progressing   Problem: Nutrition: Goal: Adequate nutrition will be maintained Outcome: Progressing   Problem: Coping: Goal: Level of anxiety will decrease Outcome: Progressing   Problem: Elimination: Goal: Will not experience complications related to bowel motility Outcome: Progressing Goal: Will not experience complications related to urinary retention Outcome: Progressing   Problem: Pain Managment: Goal: General experience of comfort will improve and/or be controlled Outcome: Progressing   Problem: Safety: Goal: Ability to remain free from injury will improve Outcome: Progressing   Problem: Skin Integrity: Goal: Risk for impaired skin integrity will decrease Outcome: Progressing   Problem: Education: Goal: Knowledge of the prescribed therapeutic regimen will improve Outcome: Progressing   Problem: Bowel/Gastric: Goal: Gastrointestinal status for postoperative course will improve Outcome: Progressing   Problem: Clinical Measurements: Goal: Postoperative complications will be avoided or minimized Outcome: Progressing   Problem: Respiratory: Goal:  Ability to achieve and maintain a regular respiratory rate will improve Outcome: Progressing   Problem: Skin Integrity: Goal: Demonstration of wound healing without infection will improve Outcome: Progressing   Problem: Urinary Elimination: Goal: Ability to avoid or minimize complications of infection will improve Outcome: Progressing Goal: Ability to achieve and maintain urine output will improve Outcome: Progressing   Problem: Fluid Volume: Goal: Hemodynamic stability will improve Outcome: Progressing   Problem: Clinical Measurements: Goal: Diagnostic test results will improve Outcome: Progressing Goal: Signs and symptoms of infection will decrease Outcome: Progressing   Problem: Respiratory: Goal: Ability to maintain adequate ventilation will improve Outcome: Progressing

## 2023-09-02 NOTE — Consult Note (Addendum)
 [place]   PATIENT NAME: Victoria Clarke    MR#:  [id number]  DATE OF BIRTH:  November 30, 1950  DATE OF ADMISSION:  08/31/2023  PRIMARY CARE PHYSICIAN: Carollynn Cirri, NP   Patient is coming from: Home  REQUESTING/REFERRING PHYSICIAN: [name], MD  CHIEF COMPLAINT:  Suspected sepsis  HISTORY OF PRESENT ILLNESS:  Victoria Clarke is a 73 y.o. 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 Ace Holder with ureteral stent removal.  The patient had an unremarkable night and this afternoon has been having worsening creatinine as well as leukocytosis, tachycardia and hypotension that responded to fluid bolus.  She has a Foley catheter and has been having mildly diminished urine output.  No cough or wheezing or dyspnea.  No nausea or vomiting or abdominal pain beyond her post nephrectomy mild tenderness and discomfort.  No chest pain or palpitations.  No bleeding diathesis.  Vital signs this evening and dropped to 75/47 with hydration came up to 97/50 with heart rate of 107 earlier 97.  BP later on was 103/54 with a MAP of 70.  She was slightly hypoxic was placed on 2.5-3 L of O2 per nasal cannula with pulse oximetry of 99 to 100%.  Labs revealed stat CBC which showed the status of 15.4 compared to 16.4 earlier and hemoglobin of 8.1 with hematocrit 26.6 compared to 8.9 and 29.1 earlier, CMP with mild hyponatremia of 134 and CO2 of 20 with glucose of 109, BUN of 31 with creatinine 3.36 compared to 3.22 earlier this afternoon and 2.81 this morning.  INR is 1.3 with PT 16.3 and PTT 40.  Blood cultures were drawn.  Surgical pathology revealed renal calculi with obstructive uropathy and hydronephrosis with extensive cortical atrophy, acute and chronic inflammation and incidental benign adrenal gland with no evidence for malignancy. EKG as reviewed by me :  Last EKG on 5/30 revealed normal sinus rhythm with a rate of 98. Imaging: Portable chest x-ray showed left basilar opacity likely atelectasis and postoperative pneumoperitoneum.  We are consulted for medical evaluation and management. The patient was given IV normal saline bolus and was started on broad-spectrum IV antibiotic therapy will be mentioned below PAST MEDICAL HISTORY:   Past Medical History:  Diagnosis Date   Colon polyp    Former smoker    High cholesterol    Hydronephrosis of left kidney    Hypertension    Left renal atrophy    Normocytic anemia    Obesity    Recurrent UTI (urinary tract infection)    Sepsis (HCC)    Ureteral obstruction, left     PAST SURGICAL HISTORY:   Past Surgical History:  Procedure Laterality Date   BREAST BIOPSY Left 04-26-14   ductal hyperplasia, 1 mm papilloma.   BREAST SURGERY Right 10/25/2009   milk duct excision in [place] , atypical duct epithelial proliferation l   CESAREAN SECTION  1981   CHOLECYSTECTOMY  2006   COLONOSCOPY  2016   Dr Felicita Horns   COLONOSCOPY WITH PROPOFOL  N/A 07/30/2022   Procedure: COLONOSCOPY WITH PROPOFOL ;  Surgeon: Luke Salaam, MD;  [facility] ENDOSCOPY;  Service: Gastroenterology;  Laterality: N/A;   CYSTOSCOPY W/ URETERAL STENT PLACEMENT Left 07/22/2023   Procedure: CYSTOSCOPY, WITH RETROGRADE PYELOGRAM AND URETERAL STENT INSERTION;  Surgeon: [name], MD;  Location: [facility] ORS;  Service: Urology;  Laterality: Left;   CYSTOSCOPY W/ URETERAL STENT REMOVAL Left  08/31/2023   Procedure: REMOVAL, STENT, URETER;  Surgeon: [name], MD;  Location: [facility] ORS;  Service: Urology;  Laterality: Left;   EXCISION / BIOPSY BREAST / NIPPLE / DUCT Right 2011   duct excision   LAPAROSCOPIC NEPHRECTOMY, HAND ASSISTED Left 08/31/2023   Procedure: NEPHRECTOMY, HAND-ASSISTED, LAPAROSCOPIC;  Surgeon: [name], MD;  Location: [facility] ORS;  Service: Urology;  Laterality: Left;    SOCIAL HISTORY:   Social History    Tobacco Use   Smoking status: Former    Current packs/day: 0.00    Average packs/day: 1 pack/day for 30.0 years (30.0 ttl pk-yrs)    Types: Cigarettes    Start date: 04/28/1969    Quit date: 04/29/1999    Years since quitting: 24.3   Smokeless tobacco: Never  Substance Use Topics   Alcohol use: No    FAMILY HISTORY:   Family History  Problem Relation Age of Onset   Breast cancer Mother 83       Low grade DCIS   Dementia Mother    Dementia Father    Colon polyps Father    Breast cancer Sister 74   Healthy Sister    Cancer Maternal Aunt        breast/great Aunt    DRUG ALLERGIES:  No Known Allergies  REVIEW OF SYSTEMS:   ROS As per history of present illness. All pertinent systems were reviewed above. Constitutional, HEENT, cardiovascular, respiratory, GI, GU, musculoskeletal, neuro, psychiatric, endocrine, integumentary and hematologic systems were reviewed and are otherwise negative/unremarkable except for positive findings mentioned above in the HPI.   MEDICATIONS AT HOME:   Prior to Admission medications   Medication Sig Start Date End Date Taking? Authorizing Provider  amLODipine -olmesartan  (AZOR ) 10-40 MG tablet TAKE 1 TABLET DAILY Patient taking differently: Take 1 tablet by mouth every morning. 12/24/22  Yes Carollynn Cirri, NP  atorvastatin  (LIPITOR) 20 MG tablet TAKE 1 TABLET DAILY Patient taking differently: Take 20 mg by mouth every morning. 06/23/23  Yes Baity, Rankin Buzzard, NP  ciprofloxacin  (CIPRO ) 500 MG tablet Take 1 tablet (500 mg total) by mouth every 12 (twelve) hours. 08/28/23  Yes [name], MD  ondansetron  (ZOFRAN -ODT) 4 MG disintegrating tablet Take 1 tablet (4 mg total) by mouth every 8 (eight) hours as needed for nausea or vomiting. 08/17/23  Yes Stoioff, Kizzie Perks, MD  tamsulosin  (FLOMAX ) 0.4 MG CAPS capsule Take 1 capsule (0.4 mg total) by mouth daily after breakfast. 07/25/23  Yes Melvinia Stager, MD      VITAL SIGNS:  Blood pressure (!) 99/48,  pulse 98, temperature 97.8 F (36.6 C), temperature source Oral, resp. rate 18, height 5\' 4"  (1.626 m), weight 77.7 kg, SpO2 99%.  PHYSICAL EXAMINATION:  Physical Exam  GENERAL:  73 y.o.-year-old patient lying in the bed with no acute distress.  EYES: Pupils equal, round, reactive to light and accommodation. No scleral icterus. Extraocular muscles intact.  HEENT: Head atraumatic, normocephalic. Oropharynx and nasopharynx clear.  NECK:  Supple, no jugular venous distention. No thyroid enlargement, no tenderness.  LUNGS: Normal breath sounds bilaterally, no wheezing, rales,rhonchi or crepitation. No use of accessory muscles of respiration.  CARDIOVASCULAR: Regular rate and rhythm, S1, S2 normal. No murmurs, rubs, or gallops.  ABDOMEN: Soft, nondistended, nontender. Bowel sounds present. No organomegaly or mass.  EXTREMITIES: No pedal edema, cyanosis, or clubbing.  NEUROLOGIC: Cranial nerves II through XII are intact. Muscle strength 5/5 in all extremities. Sensation intact. Gait not checked.  PSYCHIATRIC: The patient is alert and  oriented x 3.  Normal affect and good eye contact. SKIN: No obvious rash, lesion, or ulcer.   LABORATORY PANEL:   CBC Recent Labs  Lab 09/02/23 0414  WBC 13.6*  HGB 7.4*  HCT 25.0*  PLT 226   ------------------------------------------------------------------------------------------------------------------  Chemistries  Recent Labs  Lab 09/01/23 2221  NA 134*  K 5.0  CL 106  CO2 20*  GLUCOSE 109*  BUN 31*  CREATININE 3.36*  CALCIUM  7.8*  AST 18  ALT 5  ALKPHOS 49  BILITOT 0.4   ------------------------------------------------------------------------------------------------------------------  Cardiac Enzymes No results for input(s): "TROPONINI" in the last 168 hours. ------------------------------------------------------------------------------------------------------------------  RADIOLOGY:  DG Chest 1 View Result Date: 09/01/2023 CLINICAL  DATA:  Hypoxia EXAM: CHEST  1 VIEW COMPARISON:  None available FINDINGS: Heart and mediastinal contours within normal limits. Aortic atherosclerosis. Left basilar opacity, favor atelectasis. Free air under the right hemidiaphragm. No acute bony abnormality. IMPRESSION: Left basilar opacity, likely atelectasis. Pneumoperitoneum. Recommend clinical correlation for recent surgery. Electronically Signed   By: Janeece Mechanic M.D.   On: 09/01/2023 22:30      IMPRESSION AND PLAN:  Assessment and Plan: * Sepsis due to gram-negative UTI (HCC) - The patient be placed on empiric IV antibiotic therapy with vancomycin, cefepime  and Flagyl. - She will continue hydration with IV with the ringer. - We will monitor lactic acid. - Her hypotension likely associated with severe sepsis is currently resolving.  Acute respiratory failure with hypoxia (HCC) - She had an elevated dimer of 3.72 and was 4.91 earlier during the day. - VQ scan was ordered for a.m.  H/O left nephrectomy - This is secondary to atrophy and hydronephrosis with pyonephrosis.  Dyslipidemia - Will continue statin therapy.   DVT prophylaxis: Lovenox .  Advanced Care Planning:  Code Status: full code.  Family Communication:  The plan of care was discussed in details with the patient (and family). I answered all questions. The patient agreed to proceed with the above mentioned plan. Further management will depend upon hospital course. Disposition Plan: Back to previous home environment Consults called: none.  All the records are reviewed and case discussed with ED provider.  Status is: Inpatient  At the time of the admission, it appears that the appropriate admission status for this patient is inpatient.  This is judged to be reasonable and necessary in order to provide the required intensity of service to ensure the patient's safety given the presenting symptoms, physical exam findings and initial radiographic and laboratory data in the  context of comorbid conditions.  The patient requires inpatient status due to high intensity of service, high risk of further deterioration and high frequency of surveillance required.  I certify that at the time of admission, it is my clinical judgment that the patient will require inpatient hospital care extending more than 2 midnights.                            Dispo: The patient is from: Home              Anticipated d/c is to: Home              Patient currently is not medically stable to d/c.              Difficult to place patient: No  Thank you Dr. Ace Holder for allowing us  to participate in the care of this very pleasant lady.  Will follow the patient along with you.  Virgene Griffin M.D on 09/02/2023 at 5:44 AM  [facility]   From 7 PM-7 AM, contact night-coverage [email or web address]  CC: Primary care physician; Carollynn Cirri, NP

## 2023-09-02 NOTE — Assessment & Plan Note (Signed)
 Will continue statin therapy

## 2023-09-02 NOTE — Assessment & Plan Note (Signed)
-   This is secondary to atrophy and hydronephrosis with pyonephrosis.

## 2023-09-02 NOTE — Progress Notes (Signed)
 Preop to eval risk of intraoperative bleeding  [name], MD

## 2023-09-02 NOTE — Consult Note (Addendum)
 [place]   PATIENT NAME: Victoria Clarke    MR#:  [id number]  DATE OF BIRTH:  [birth year]  DATE OF ADMISSION:  08/31/2023  PRIMARY CARE PHYSICIAN: Carollynn Cirri, NP   Patient is coming from: Home  REQUESTING/REFERRING PHYSICIAN: [name], MD  CHIEF COMPLAINT:  Suspected sepsis  HISTORY OF PRESENT ILLNESS:  Summar Coole is a 73 y.o. 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 Ace Holder with ureteral stent removal.  The patient had an unremarkable night and this afternoon has been having worsening creatinine as well as leukocytosis, tachycardia and hypotension that responded to fluid bolus.  She has a Foley catheter and has been having mildly diminished urine output.  No cough or wheezing or dyspnea.  No nausea or vomiting or abdominal pain beyond her post nephrectomy mild tenderness and discomfort.  No chest pain or palpitations.  No bleeding diathesis.  Vital signs this evening and dropped to 75/47 with hydration came up to 97/50 with heart rate of 107 earlier 97.  BP later on was 103/54 with a MAP of 70.  She was slightly hypoxic was placed on 2.5-3 L of O2 per nasal cannula with pulse oximetry of 99 to 100%.  Labs revealed stat CBC which showed the status of 15.4 compared to 16.4 earlier and hemoglobin of 8.1 with hematocrit 26.6 compared to 8.9 and 29.1 earlier, CMP with mild hyponatremia of 134 and CO2 of 20 with glucose of 109, BUN of 31 with creatinine 3.36 compared to 3.22 earlier this afternoon and 2.81 this morning.  INR is 1.3 with PT 16.3 and PTT 40.  Blood cultures were drawn.  Surgical pathology revealed renal calculi with obstructive uropathy and hydronephrosis with extensive cortical atrophy, acute and chronic inflammation and incidental benign adrenal gland with no evidence for malignancy. EKG as reviewed by me :  Last EKG on 5/30 revealed normal sinus rhythm with a rate of 98. Imaging: Portable chest x-ray showed left basilar opacity likely atelectasis and postoperative pneumoperitoneum.  We are consulted for medical evaluation and management. The patient was given IV normal saline bolus and was started on broad-spectrum IV antibiotic therapy will be mentioned below PAST MEDICAL HISTORY:   Past Medical History:  Diagnosis Date   Colon polyp    Former smoker    High cholesterol    Hydronephrosis of left kidney    Hypertension    Left renal atrophy    Normocytic anemia    Obesity    Recurrent UTI (urinary tract infection)    Sepsis (HCC)    Ureteral obstruction, left     PAST SURGICAL HISTORY:   Past Surgical History:  Procedure Laterality Date   BREAST BIOPSY Left 04-26-14   ductal hyperplasia, 1 mm papilloma.   BREAST SURGERY Right 10/25/2009   milk duct excision in [place] , atypical duct epithelial proliferation l   CESAREAN SECTION  1981   CHOLECYSTECTOMY  2006   COLONOSCOPY  2016   Dr Felicita Horns   COLONOSCOPY WITH PROPOFOL  N/A 07/30/2022   Procedure: COLONOSCOPY WITH PROPOFOL ;  Surgeon: Luke Salaam, MD;  Location: [facility] ENDOSCOPY;  Service: Gastroenterology;  Laterality: N/A;   CYSTOSCOPY W/ URETERAL STENT PLACEMENT Left 07/22/2023   Procedure: CYSTOSCOPY, WITH RETROGRADE PYELOGRAM AND URETERAL STENT INSERTION;  Surgeon: [name], MD;  Location: [facility] ORS;  Service: Urology;  Laterality: Left;   CYSTOSCOPY W/ URETERAL STENT REMOVAL Left  08/31/2023   Procedure: REMOVAL, STENT, URETER;  Surgeon: [name], MD;  Location: [facility] ORS;  Service: Urology;  Laterality: Left;   EXCISION / BIOPSY BREAST / NIPPLE / DUCT Right 2011   duct excision   LAPAROSCOPIC NEPHRECTOMY, HAND ASSISTED Left 08/31/2023   Procedure: NEPHRECTOMY, HAND-ASSISTED, LAPAROSCOPIC;  Surgeon: [name], MD;  Location: [facility] ORS;  Service: Urology;  Laterality: Left;    SOCIAL HISTORY:   Social History    Tobacco Use   Smoking status: Former    Current packs/day: 0.00    Average packs/day: 1 pack/day for 30.0 years (30.0 ttl pk-yrs)    Types: Cigarettes    Start date: 04/28/1969    Quit date: 04/29/1999    Years since quitting: 24.3   Smokeless tobacco: Never  Substance Use Topics   Alcohol use: No    FAMILY HISTORY:   Family History  Problem Relation Age of Onset   Breast cancer Mother 43       Low grade DCIS   Dementia Mother    Dementia Father    Colon polyps Father    Breast cancer Sister 49   Healthy Sister    Cancer Maternal Aunt        breast/great Aunt    DRUG ALLERGIES:  No Known Allergies    MEDICATIONS AT HOME:   Prior to Admission medications   Medication Sig Start Date End Date Taking? Authorizing Provider  amLODipine -olmesartan  (AZOR ) 10-40 MG tablet TAKE 1 TABLET DAILY Patient taking differently: Take 1 tablet by mouth every morning. 12/24/22  Yes Carollynn Cirri, NP  atorvastatin  (LIPITOR) 20 MG tablet TAKE 1 TABLET DAILY Patient taking differently: Take 20 mg by mouth every morning. 06/23/23  Yes Baity, Rankin Buzzard, NP  ciprofloxacin  (CIPRO ) 500 MG tablet Take 1 tablet (500 mg total) by mouth every 12 (twelve) hours. 08/28/23  Yes [name], MD  ondansetron  (ZOFRAN -ODT) 4 MG disintegrating tablet Take 1 tablet (4 mg total) by mouth every 8 (eight) hours as needed for nausea or vomiting. 08/17/23  Yes Stoioff, Kizzie Perks, MD  tamsulosin  (FLOMAX ) 0.4 MG CAPS capsule Take 1 capsule (0.4 mg total) by mouth daily after breakfast. 07/25/23  Yes Melvinia Stager, MD      VITAL SIGNS:  Blood pressure (!) 94/50, pulse 92, temperature 98.2 F (36.8 C), resp. rate 16, height 5\' 4"  (1.626 m), weight 77.7 kg, SpO2 93%.  PHYSICAL EXAMINATION:  Physical Exam  NAD CTAB save for rales at base RRR Abdomen soft, healing lap sites on left, drain on left Has urinary catheter No LE edema  LABORATORY PANEL:   CBC Recent Labs  Lab 09/02/23 0414  WBC 13.6*  HGB 7.4*   HCT 25.0*  PLT 226   ------------------------------------------------------------------------------------------------------------------  Chemistries  Recent Labs  Lab 09/01/23 2221 09/02/23 0414  NA 134* 134*  K 5.0 5.1  CL 106 107  CO2 20* 19*  GLUCOSE 109* 89  BUN 31* 33*  CREATININE 3.36* 3.28*  CALCIUM  7.8* 7.6*  MG  --  1.9  AST 18  --   ALT 5  --   ALKPHOS 49  --   BILITOT 0.4  --    ------------------------------------------------------------------------------------------------------------------  Cardiac Enzymes No results for input(s): "TROPONINI" in the last 168 hours. ------------------------------------------------------------------------------------------------------------------  RADIOLOGY:  NM Pulmonary Perfusion Result Date: 09/02/2023 CLINICAL DATA:  Elevated D-dimer.  Hypoxemia EXAM: NUCLEAR MEDICINE PERFUSION LUNG SCAN TECHNIQUE: Perfusion images were obtained in multiple projections after intravenous injection of radiopharmaceutical. RADIOPHARMACEUTICALS:  4.2 mCi  Tc-81m MAA COMPARISON:  Radiograph 09/01/2023, CT abdomen pelvis 08/15/2023 FINDINGS: No wedge-shaped peripheral perfusion defect within LEFT or RIGHT lung to suggest acute pulmonary embolism. Normal perfusion pattern. Elevation LEFT hemidiaphragm noted IMPRESSION: No evidence acute pulmonary embolism. Electronically Signed   By: Deboraha Fallow M.D.   On: 09/02/2023 13:54   DG Chest 1 View Result Date: 09/01/2023 CLINICAL DATA:  Hypoxia EXAM: CHEST  1 VIEW COMPARISON:  None available FINDINGS: Heart and mediastinal contours within normal limits. Aortic atherosclerosis. Left basilar opacity, favor atelectasis. Free air under the right hemidiaphragm. No acute bony abnormality. IMPRESSION: Left basilar opacity, likely atelectasis. Pneumoperitoneum. Recommend clinical correlation for recent surgery. Electronically Signed   By: Janeece Mechanic M.D.   On: 09/01/2023 22:30      IMPRESSION AND PLAN:   Assessment and Plan: * Sepsis due to gram-negative UTI (HCC) hypotension 4/30 culture with pseudomonas - given aki will d/c cefepime  and start cipro , can d/c flagyl and vancomycin - was started on midodrine by my colleague but home bp meds have also been continued. Will discontinue those as they contribute to hypotension, continue midodrine for now, continue fluids for now  Acute respiratory failure with hypoxia (HCC) - She had an elevated dimer of 3.72 and was 4.91 earlier during the day. - weaned to room air currently --vq scan neg  AKI With decreased urine output last few days. Now with just one functional kidney - start sodium bicarb for metabolic acidosis - will check renal u/s of remaining kidney - continue ivf - would consult nephrology if no renal recovery in next 24-48 hours - stop irbesartan, avoid nephrotoxic agents - d/c cefepime  as above - maintain foley for now  Anemia Starting hgb of around 10, has trended to 7.4 today. Post-op blood loss may contribute but ebl was 100. Belly is soft, hasn't stooled.  - trend hgb - abdominal and retroperitoneal imaging if hgb continues to drop - will hold heparin for the time being, start SCDs instead  H/O left nephrectomy - This is secondary to atrophy and hydronephrosis with pyonephrosis.  Dyslipidemia - Will continue statin therapy.    Raymonde Calico M.D on 09/02/2023 at 4:37 PM  [facility]   From 7 PM-7 AM, contact night-coverage [email or web address]  CC: Primary care physician; Carollynn Cirri, NP

## 2023-09-02 NOTE — Assessment & Plan Note (Signed)
-   She had an elevated dimer of 3.72 and was 4.91 earlier during the day. - VQ scan was ordered for a.m.

## 2023-09-02 NOTE — Assessment & Plan Note (Signed)
-   The patient be placed on empiric IV antibiotic therapy with vancomycin, cefepime  and Flagyl. - She will continue hydration with IV with the ringer. - We will monitor lactic acid. - Her hypotension likely associated with severe sepsis is currently resolving.

## 2023-09-03 DIAGNOSIS — A4150 Gram-negative sepsis, unspecified: Secondary | ICD-10-CM | POA: Diagnosis not present

## 2023-09-03 DIAGNOSIS — N179 Acute kidney failure, unspecified: Secondary | ICD-10-CM

## 2023-09-03 DIAGNOSIS — N390 Urinary tract infection, site not specified: Secondary | ICD-10-CM | POA: Diagnosis not present

## 2023-09-03 DIAGNOSIS — A419 Sepsis, unspecified organism: Secondary | ICD-10-CM

## 2023-09-03 LAB — CBC
HCT: 24.4 % — ABNORMAL LOW (ref 36.0–46.0)
Hemoglobin: 7.3 g/dL — ABNORMAL LOW (ref 12.0–15.0)
MCH: 26.2 pg (ref 26.0–34.0)
MCHC: 29.9 g/dL — ABNORMAL LOW (ref 30.0–36.0)
MCV: 87.5 fL (ref 80.0–100.0)
Platelets: 280 10*3/uL (ref 150–400)
RBC: 2.79 MIL/uL — ABNORMAL LOW (ref 3.87–5.11)
RDW: 15.8 % — ABNORMAL HIGH (ref 11.5–15.5)
WBC: 12.5 10*3/uL — ABNORMAL HIGH (ref 4.0–10.5)
nRBC: 0 % (ref 0.0–0.2)

## 2023-09-03 LAB — BASIC METABOLIC PANEL WITH GFR
Anion gap: 8 (ref 5–15)
BUN: 39 mg/dL — ABNORMAL HIGH (ref 8–23)
CO2: 20 mmol/L — ABNORMAL LOW (ref 22–32)
Calcium: 8 mg/dL — ABNORMAL LOW (ref 8.9–10.3)
Chloride: 108 mmol/L (ref 98–111)
Creatinine, Ser: 3.55 mg/dL — ABNORMAL HIGH (ref 0.44–1.00)
GFR, Estimated: 13 mL/min — ABNORMAL LOW (ref >60)
Glucose, Bld: 86 mg/dL (ref 70–99)
Potassium: 5 mmol/L (ref 3.5–5.1)
Sodium: 136 mmol/L (ref 135–145)

## 2023-09-03 LAB — PHOSPHORUS: Phosphorus: 3.7 mg/dL (ref 2.5–4.6)

## 2023-09-03 LAB — MAGNESIUM: Magnesium: 2 mg/dL (ref 1.7–2.4)

## 2023-09-03 MED ORDER — SODIUM CHLORIDE 0.9 % IV SOLN
2.0000 g | INTRAVENOUS | Status: DC
  Administered 2023-09-03 – 2023-09-05 (×3): 2 g via INTRAVENOUS
  Filled 2023-09-03 (×5): qty 2

## 2023-09-03 NOTE — Consult Note (Signed)
 [place]   PATIENT NAME: Victoria Clarke    MR#:  [id number]  DATE OF BIRTH:  27-Jun-1950  DATE OF ADMISSION:  08/31/2023  PRIMARY CARE PHYSICIAN: Carollynn Cirri, NP   Patient is coming from: Home  REQUESTING/REFERRING PHYSICIAN: [name], MD  CHIEF COMPLAINT:  Suspected sepsis  HISTORY OF PRESENT ILLNESS:  Victoria Clarke is a 73 y.o. 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 Ace Holder with ureteral stent removal.  The patient had an unremarkable night and this afternoon has been having worsening creatinine as well as leukocytosis, tachycardia and hypotension that responded to fluid bolus.  She has a Foley catheter and has been having mildly diminished urine output.  No cough or wheezing or dyspnea.  No nausea or vomiting or abdominal pain beyond her post nephrectomy mild tenderness and discomfort.  No chest pain or palpitations.  No bleeding diathesis.  Vital signs this evening and dropped to 75/47 with hydration came up to 97/50 with heart rate of 107 earlier 97.  BP later on was 103/54 with a MAP of 70.  She was slightly hypoxic was placed on 2.5-3 L of O2 per nasal cannula with pulse oximetry of 99 to 100%.  Labs revealed stat CBC which showed the status of 15.4 compared to 16.4 earlier and hemoglobin of 8.1 with hematocrit 26.6 compared to 8.9 and 29.1 earlier, CMP with mild hyponatremia of 134 and CO2 of 20 with glucose of 109, BUN of 31 with creatinine 3.36 compared to 3.22 earlier this afternoon and 2.81 this morning.  INR is 1.3 with PT 16.3 and PTT 40.  Blood cultures were drawn.  Surgical pathology revealed renal calculi with obstructive uropathy and hydronephrosis with extensive cortical atrophy, acute and chronic inflammation and incidental benign adrenal gland with no evidence for malignancy. EKG as reviewed by me :  Last EKG on 5/30 revealed normal sinus rhythm with a rate of 98. Imaging: Portable chest x-ray showed left basilar opacity likely atelectasis and postoperative pneumoperitoneum.  We are consulted for medical evaluation and management. The patient was given IV normal saline bolus and was started on broad-spectrum IV antibiotic therapy will be mentioned below PAST MEDICAL HISTORY:   Past Medical History:  Diagnosis Date   Colon polyp    Former smoker    High cholesterol    Hydronephrosis of left kidney    Hypertension    Left renal atrophy    Normocytic anemia    Obesity    Recurrent UTI (urinary tract infection)    Sepsis (HCC)    Ureteral obstruction, left     PAST SURGICAL HISTORY:   Past Surgical History:  Procedure Laterality Date   BREAST BIOPSY Left 04-26-14   ductal hyperplasia, 1 mm papilloma.   BREAST SURGERY Right 10/25/2009   milk duct excision in [place] , atypical duct epithelial proliferation l   CESAREAN SECTION  1981   CHOLECYSTECTOMY  2006   COLONOSCOPY  2016   Dr Felicita Horns   COLONOSCOPY WITH PROPOFOL  N/A 07/30/2022   Procedure: COLONOSCOPY WITH PROPOFOL ;  Surgeon: Luke Salaam, MD;  Location: [facility] ENDOSCOPY;  Service: Gastroenterology;  Laterality: N/A;   CYSTOSCOPY W/ URETERAL STENT PLACEMENT Left 07/22/2023   Procedure: CYSTOSCOPY, WITH RETROGRADE PYELOGRAM AND URETERAL STENT INSERTION;  Surgeon: [name], MD;  Location: [facility] ORS;  Service: Urology;  Laterality: Left;   CYSTOSCOPY W/ URETERAL STENT REMOVAL Left  08/31/2023   Procedure: REMOVAL, STENT, URETER;  Surgeon: [name], MD;  Location: [facility] ORS;  Service: Urology;  Laterality: Left;   EXCISION / BIOPSY BREAST / NIPPLE / DUCT Right 2011   duct excision   LAPAROSCOPIC NEPHRECTOMY, HAND ASSISTED Left 08/31/2023   Procedure: NEPHRECTOMY, HAND-ASSISTED, LAPAROSCOPIC;  Surgeon: [name], MD;  Location: [facility] ORS;  Service: Urology;  Laterality: Left;    SOCIAL HISTORY:   Social History    Tobacco Use   Smoking status: Former    Current packs/day: 0.00    Average packs/day: 1 pack/day for 30.0 years (30.0 ttl pk-yrs)    Types: Cigarettes    Start date: 04/28/1969    Quit date: 04/29/1999    Years since quitting: 24.3   Smokeless tobacco: Never  Substance Use Topics   Alcohol use: No    FAMILY HISTORY:   Family History  Problem Relation Age of Onset   Breast cancer Mother 20       Low grade DCIS   Dementia Mother    Dementia Father    Colon polyps Father    Breast cancer Sister 82   Healthy Sister    Cancer Maternal Aunt        breast/great Aunt    DRUG ALLERGIES:  No Known Allergies    MEDICATIONS AT HOME:   Prior to Admission medications   Medication Sig Start Date End Date Taking? Authorizing Provider  amLODipine -olmesartan  (AZOR ) 10-40 MG tablet TAKE 1 TABLET DAILY Patient taking differently: Take 1 tablet by mouth every morning. 12/24/22  Yes Carollynn Cirri, NP  atorvastatin  (LIPITOR) 20 MG tablet TAKE 1 TABLET DAILY Patient taking differently: Take 20 mg by mouth every morning. 06/23/23  Yes Baity, Rankin Buzzard, NP  ciprofloxacin  (CIPRO ) 500 MG tablet Take 1 tablet (500 mg total) by mouth every 12 (twelve) hours. 08/28/23  Yes [name], MD  ondansetron  (ZOFRAN -ODT) 4 MG disintegrating tablet Take 1 tablet (4 mg total) by mouth every 8 (eight) hours as needed for nausea or vomiting. 08/17/23  Yes Stoioff, Kizzie Perks, MD  tamsulosin  (FLOMAX ) 0.4 MG CAPS capsule Take 1 capsule (0.4 mg total) by mouth daily after breakfast. 07/25/23  Yes Melvinia Stager, MD      VITAL SIGNS:  Blood pressure (!) 104/47, pulse 85, temperature 97.9 F (36.6 C), temperature source Oral, resp. rate 18, height 5\' 4"  (1.626 m), weight 77.7 kg, SpO2 96%.  PHYSICAL EXAMINATION:  Physical Exam  NAD CTAB save for rales at base RRR Abdomen soft, healing lap sites on left, drain on left Has urinary catheter No LE edema  LABORATORY PANEL:   CBC Recent Labs  Lab 09/03/23 0505   WBC 12.5*  HGB 7.3*  HCT 24.4*  PLT 280   ------------------------------------------------------------------------------------------------------------------  Chemistries  Recent Labs  Lab 09/01/23 2221 09/02/23 0414 09/03/23 0505  NA 134*   < > 136  K 5.0   < > 5.0  CL 106   < > 108  CO2 20*   < > 20*  GLUCOSE 109*   < > 86  BUN 31*   < > 39*  CREATININE 3.36*   < > 3.55*  CALCIUM  7.8*   < > 8.0*  MG  --    < > 2.0  AST 18  --   --   ALT 5  --   --   ALKPHOS 49  --   --   BILITOT 0.4  --   --    < > =  values in this interval not displayed.   ------------------------------------------------------------------------------------------------------------------  Cardiac Enzymes No results for input(s): "TROPONINI" in the last 168 hours. ------------------------------------------------------------------------------------------------------------------  RADIOLOGY:  US  RENAL Result Date: 09/02/2023 CLINICAL DATA:  Acute renal injury EXAM: RENAL / URINARY TRACT ULTRASOUND COMPLETE COMPARISON:  08/15/2023 FINDINGS: Right Kidney: Renal measurements: 14.3 x 5.9 x 6.9 cm. = volume: 305 mL. Echogenicity within normal limits. No mass or hydronephrosis visualized. 1.4 cm cyst is noted similar to that seen on prior CT examination. No follow-up is recommended. Left Kidney: Surgically removed 2 days previous Bladder: Bladder is well distended. Some dependent echogenicity is noted which may be related to debris. Correlate with urinalysis. No other focal abnormality is noted. Other: None. IMPRESSION: Status post left nephrectomy. No acute abnormality noted. Electronically Signed   By: Violeta Grey M.D.   On: 09/02/2023 19:38   NM Pulmonary Perfusion Result Date: 09/02/2023 CLINICAL DATA:  Elevated D-dimer.  Hypoxemia EXAM: NUCLEAR MEDICINE PERFUSION LUNG SCAN TECHNIQUE: Perfusion images were obtained in multiple projections after intravenous injection of radiopharmaceutical. RADIOPHARMACEUTICALS:  4.2  mCi Tc-57m MAA COMPARISON:  Radiograph 09/01/2023, CT abdomen pelvis 08/15/2023 FINDINGS: No wedge-shaped peripheral perfusion defect within LEFT or RIGHT lung to suggest acute pulmonary embolism. Normal perfusion pattern. Elevation LEFT hemidiaphragm noted IMPRESSION: No evidence acute pulmonary embolism. Electronically Signed   By: Deboraha Fallow M.D.   On: 09/02/2023 13:54   DG Chest 1 View Result Date: 09/01/2023 CLINICAL DATA:  Hypoxia EXAM: CHEST  1 VIEW COMPARISON:  None available FINDINGS: Heart and mediastinal contours within normal limits. Aortic atherosclerosis. Left basilar opacity, favor atelectasis. Free air under the right hemidiaphragm. No acute bony abnormality. IMPRESSION: Left basilar opacity, likely atelectasis. Pneumoperitoneum. Recommend clinical correlation for recent surgery. Electronically Signed   By: Janeece Mechanic M.D.   On: 09/01/2023 22:30      IMPRESSION AND PLAN:  Assessment and Plan: * Sepsis due to gram-negative UTI (HCC) hypotension 4/30 culture with pseudomonas - given aki have discontinued cefepime  and started cipro . Nausea with that, will reach out to pharmacy regarding other options - continue midodrine  Acute respiratory failure with hypoxia (HCC) - She had an elevated dimer of 3.72 and was 4.91 earlier during the day. - weaned to room air currently --vq scan neg  AKI With decreased urine output last few days. Now with just one functional kidney. Received contrast 4/19. Received cefepime  here and arb. Uop has improved, 1 liter last 24. Renal u/s no hydro but did show distended bladder. Catheter draining well today (flushed) and bladder scan is 0. - cont sodium bicarb for metabolic acidosis - continue ivf - nephrology consulted today - continuing foley  Anemia Starting hgb of around 10, has trended to 7s today. Post-op blood loss may contribute but ebl was 100. Likely hemodilution as well. Hgb stable yesterday to today - trend for now  H/O left  nephrectomy - This is secondary to atrophy and hydronephrosis with pyonephrosis.    Raymonde Calico M.D on 09/03/2023 at 1:54 PM  [facility]   From 7 PM-7 AM, contact night-coverage [email or web address]

## 2023-09-03 NOTE — Plan of Care (Signed)
  Problem: Education: Goal: Knowledge of General Education information will improve Description: Including pain rating scale, medication(s)/side effects and non-pharmacologic comfort measures Outcome: Progressing   Problem: Health Behavior/Discharge Planning: Goal: Ability to manage health-related needs will improve Outcome: Progressing   Problem: Clinical Measurements: Goal: Ability to maintain clinical measurements within normal limits will improve Outcome: Progressing Goal: Will remain free from infection Outcome: Progressing Goal: Diagnostic test results will improve Outcome: Progressing Goal: Respiratory complications will improve Outcome: Progressing Goal: Cardiovascular complication will be avoided Outcome: Progressing   Problem: Activity: Goal: Risk for activity intolerance will decrease Outcome: Progressing   Problem: Nutrition: Goal: Adequate nutrition will be maintained Outcome: Progressing   Problem: Coping: Goal: Level of anxiety will decrease Outcome: Progressing   Problem: Elimination: Goal: Will not experience complications related to bowel motility Outcome: Progressing Goal: Will not experience complications related to urinary retention Outcome: Progressing   Problem: Pain Managment: Goal: General experience of comfort will improve and/or be controlled Outcome: Progressing   Problem: Safety: Goal: Ability to remain free from injury will improve Outcome: Progressing   Problem: Skin Integrity: Goal: Risk for impaired skin integrity will decrease Outcome: Progressing   Problem: Education: Goal: Knowledge of the prescribed therapeutic regimen will improve Outcome: Progressing   Problem: Bowel/Gastric: Goal: Gastrointestinal status for postoperative course will improve Outcome: Progressing   Problem: Clinical Measurements: Goal: Postoperative complications will be avoided or minimized Outcome: Progressing   Problem: Respiratory: Goal:  Ability to achieve and maintain a regular respiratory rate will improve Outcome: Progressing   Problem: Skin Integrity: Goal: Demonstration of wound healing without infection will improve Outcome: Progressing   Problem: Urinary Elimination: Goal: Ability to avoid or minimize complications of infection will improve Outcome: Progressing Goal: Ability to achieve and maintain urine output will improve Outcome: Progressing   Problem: Fluid Volume: Goal: Hemodynamic stability will improve Outcome: Progressing   Problem: Clinical Measurements: Goal: Diagnostic test results will improve Outcome: Progressing Goal: Signs and symptoms of infection will decrease Outcome: Progressing   Problem: Respiratory: Goal: Ability to maintain adequate ventilation will improve Outcome: Progressing

## 2023-09-03 NOTE — Consult Note (Signed)
 [facility]  CONSULT NOTE    Date: 09/03/2023                  Patient Name:  Victoria Clarke  MRN: [id number]  DOB: 19-Jun-1950  Age / Sex: 73 y.o., female         PCP: Carollynn Cirri, NP                 Service Requesting Consult: TRH                 Reason for Consult: Acute kidney injury            History of Present Illness: Ms. Alahnna Borrayo is a 73 y.o.  female with past medical history of hypertension and hyperlipidemia, who was admitted to [facility] on 08/31/2023 for Left renal atrophy [N26.1] Obstruction of left ureteropelvic junction (UPJ) due to stone [N20.1] Hydronephrosis of left kidney [N13.30] Left renal mass [N28.89] Hydronephrosis, left [N13.30]  Patient arrived to hospital for scheduled laparoscopic left nephrectomy. This procedure was successful however patient has developed hypotension and oliguria. Patient states she does not follow a nephrologist. She has had UTI's for years and it was found that due to multiple kidney stones in left kidney. After failed stent placement, patient agreed to nephrectomy.   Normal function noted in march with creatinine 0.99. Creatinine 2.81 on arrival and has increased to 3.55 today. Surgical procedure performed on March 5.    Medications: Outpatient medications: Medications Prior to Admission  Medication Sig Dispense Refill Last Dose/Taking   amLODipine -olmesartan  (AZOR ) 10-40 MG tablet TAKE 1 TABLET DAILY (Patient taking differently: Take 1 tablet by mouth every morning.) 90 tablet 2 08/30/2023   atorvastatin  (LIPITOR) 20 MG tablet TAKE 1 TABLET DAILY (Patient taking differently: Take 20 mg by mouth every morning.) 90 tablet 2 08/31/2023 Morning   ciprofloxacin  (CIPRO ) 500 MG tablet Take 1 tablet (500 mg total) by mouth every 12 (twelve) hours. 14 tablet 0 08/31/2023 Morning   ondansetron  (ZOFRAN -ODT) 4 MG disintegrating tablet Take 1 tablet (4 mg total) by mouth every 8 (eight) hours as needed for nausea or  vomiting. 20 tablet 0 Past Month   tamsulosin  (FLOMAX ) 0.4 MG CAPS capsule Take 1 capsule (0.4 mg total) by mouth daily after breakfast. 30 capsule 0 08/30/2023    Current medications: Current Facility-Administered Medications  Medication Dose Route Frequency Provider Last Rate Last Admin   acetaminophen  (TYLENOL ) tablet 650 mg  650 mg Oral Q4H PRN [name], MD   650 mg at 09/02/23 6578   atorvastatin  (LIPITOR) tablet 20 mg  20 mg Oral Daily [name], MD   20 mg at 09/03/23 0805   cefTAZidime (FORTAZ) 2 g in sodium chloride  0.9 % 100 mL IVPB  2 g Intravenous Q24H Wouk, Haynes Lips, MD       Chlorhexidine Gluconate Cloth 2 % PADS 6 each  6 each Topical Daily [name], MD   6 each at 09/03/23 1015   diphenhydrAMINE (BENADRYL) injection 12.5 mg  12.5 mg Intravenous Q6H PRN [name], MD       Or   diphenhydrAMINE (BENADRYL) 12.5 MG/5ML elixir 12.5 mg  12.5 mg Oral Q6H PRN [name], MD       docusate sodium (COLACE) capsule 100 mg  100 mg Oral BID Brandon, Ashley, MD   100 mg at 09/03/23 0806   feeding supplement (ENSURE ENLIVE / ENSURE PLUS) liquid 237 mL  237 mL Oral TID BM [name],  MD   237 mL at 09/03/23 1320   heparin injection 5,000 Units  5,000 Units Subcutaneous Q8H [name], MD   5,000 Units at 09/03/23 1320   midodrine (PROAMATINE) tablet 10 mg  10 mg Oral TID WC Mansy, Jan A, MD   10 mg at 09/03/23 1159   morphine  (PF) 2 MG/ML injection 2-4 mg  2-4 mg Intravenous Q2H PRN [name], MD   4 mg at 08/31/23 1512   multivitamin with minerals tablet 1 tablet  1 tablet Oral Daily [name], MD   1 tablet at 09/03/23 0454   ondansetron  (ZOFRAN ) injection 4 mg  4 mg Intravenous Q4H PRN [name], MD   4 mg at 09/03/23 0507   ondansetron  (ZOFRAN -ODT) disintegrating tablet 4 mg  4 mg Oral Q8H PRN [name], MD       oxybutynin (DITROPAN) tablet 5 mg  5 mg Oral Q8H PRN [name], MD       oxyCODONE -acetaminophen   (PERCOCET/ROXICET) 5-325 MG per tablet 1-2 tablet  1-2 tablet Oral Q4H PRN [name], MD   2 tablet at 09/01/23 1050   sodium bicarbonate tablet 650 mg  650 mg Oral BID Janeane Mealy, MD   650 mg at 09/03/23 0981      Allergies: No Known Allergies    Past Medical History: Past Medical History:  Diagnosis Date   Colon polyp    Former smoker    High cholesterol    Hydronephrosis of left kidney    Hypertension    Left renal atrophy    Normocytic anemia    Obesity    Recurrent UTI (urinary tract infection)    Sepsis (HCC)    Ureteral obstruction, left      Past Surgical History: Past Surgical History:  Procedure Laterality Date   BREAST BIOPSY Left 04-26-14   ductal hyperplasia, 1 mm papilloma.   BREAST SURGERY Right 10/25/2009   milk duct excision in [place] , atypical duct epithelial proliferation l   CESAREAN SECTION  1981   CHOLECYSTECTOMY  2006   COLONOSCOPY  2016   Dr Felicita Horns   COLONOSCOPY WITH PROPOFOL  N/A 07/30/2022   Procedure: COLONOSCOPY WITH PROPOFOL ;  Surgeon: Luke Salaam, MD;  Location: [facility];  Service: Gastroenterology;  Laterality: N/A;   CYSTOSCOPY W/ URETERAL STENT PLACEMENT Left 07/22/2023   Procedure: CYSTOSCOPY, WITH RETROGRADE PYELOGRAM AND URETERAL STENT INSERTION;  Surgeon: [name], MD;  Location: [facility] ORS;  Service: Urology;  Laterality: Left;   CYSTOSCOPY W/ URETERAL STENT REMOVAL Left 08/31/2023   Procedure: REMOVAL, STENT, URETER;  Surgeon: [name], MD;  Location: [facility] ORS;  Service: Urology;  Laterality: Left;   EXCISION / BIOPSY BREAST / NIPPLE / DUCT Right 2011   duct excision   LAPAROSCOPIC NEPHRECTOMY, HAND ASSISTED Left 08/31/2023   Procedure: NEPHRECTOMY, HAND-ASSISTED, LAPAROSCOPIC;  Surgeon: [name], MD;  Location: [facility] ORS;  Service: Urology;  Laterality: Left;     Family History: Family History  Problem Relation Age of Onset   Breast cancer Mother 60       Low grade DCIS   Dementia Mother     Dementia Father    Colon polyps Father    Breast cancer Sister 73   Healthy Sister    Cancer Maternal Aunt        breast/great Aunt     Social History: Social History   Socioeconomic History   Marital status: Married    Spouse name: Not on file   Number of children:  Not on file   Years of education: Not on file   Highest education level: Not on file  Occupational History   Not on file  Tobacco Use   Smoking status: Former    Current packs/day: 0.00    Average packs/day: 1 pack/day for 30.0 years (30.0 ttl pk-yrs)    Types: Cigarettes    Start date: 04/28/1969    Quit date: 04/29/1999    Years since quitting: 24.3   Smokeless tobacco: Never  Vaping Use   Vaping status: Never Used  Substance and Sexual Activity   Alcohol use: No   Drug use: No   Sexual activity: Not Currently    Birth control/protection: Diaphragm, None  Other Topics Concern   Not on file  Social History Narrative   Not on file   Social Drivers of Health   Financial Resource Strain: Low Risk  (12/04/2022)   Overall Financial Resource Strain (CARDIA)    Difficulty of Paying Living Expenses: Not hard at all  Food Insecurity: No Food Insecurity (08/31/2023)   Hunger Vital Sign    Worried About Running Out of Food in the Last Year: Never true    Ran Out of Food in the Last Year: Never true  Transportation Needs: No Transportation Needs (08/31/2023)   PRAPARE - Administrator, Civil Service (Medical): No    Lack of Transportation (Non-Medical): No  Physical Activity: Insufficiently Active (12/04/2022)   Exercise Vital Sign    Days of Exercise per Week: 3 days    Minutes of Exercise per Session: 30 min  Stress: No Stress Concern Present (12/04/2022)   [employer] of Occupational Health - Occupational Stress Questionnaire    Feeling of Stress : Only a little  Social Connections: Unknown (08/31/2023)   Social Connection and Isolation Panel [NHANES]    Frequency of Communication with Friends  and Family: More than three times a week    Frequency of Social Gatherings with Friends and Family: More than three times a week    Attends Religious Services: Patient declined    Database administrator or Organizations: Patient declined    Attends Banker Meetings: Patient declined    Marital Status: Patient declined  Recent Concern: Social Connections - Moderately Isolated (07/23/2023)   Social Connection and Isolation Panel [NHANES]    Frequency of Communication with Friends and Family: More than three times a week    Frequency of Social Gatherings with Friends and Family: Three times a week    Attends Religious Services: Never    Active Member of Clubs or Organizations: No    Attends Banker Meetings: Never    Marital Status: Married  Catering manager Violence: Not At Risk (08/31/2023)   Humiliation, Afraid, Rape, and Kick questionnaire    Fear of Current or Ex-Partner: No    Emotionally Abused: No    Physically Abused: No    Sexually Abused: No     Review of Systems: Review of Systems  Constitutional:  Negative for chills, fever and malaise/fatigue.  HENT:  Negative for congestion, sore throat and tinnitus.   Eyes:  Negative for blurred vision and redness.  Respiratory:  Negative for cough, shortness of breath and wheezing.   Cardiovascular:  Negative for chest pain, palpitations, claudication and leg swelling.  Gastrointestinal:  Negative for abdominal pain, blood in stool, diarrhea, nausea and vomiting.  Genitourinary:  Negative for flank pain, frequency and hematuria.  Musculoskeletal:  Negative for back pain, falls  and myalgias.  Skin:  Negative for rash.  Neurological:  Negative for dizziness, weakness and headaches.  Endo/Heme/Allergies:  Does not bruise/bleed easily.  Psychiatric/Behavioral:  Negative for depression. The patient is not nervous/anxious and does not have insomnia.     Vital Signs: Blood pressure (!) 111/53, pulse 84,  temperature 98 F (36.7 C), resp. rate 20, height 5\' 4"  (1.626 m), weight 77.7 kg, SpO2 92%.  Weight trends: Filed Weights   08/31/23 0630 08/31/23 1316 09/01/23 1512  Weight: 72.6 kg 72.8 kg 77.7 kg    Physical Exam: General: NAD,   Head: Normocephalic, atraumatic. Moist oral mucosal membranes  Eyes: Anicteric, PERRL  Neck: Supple, trachea midline  Lungs:  Clear to auscultation, room air  Heart: Regular rate and rhythm  Abdomen:  Soft, nontender,   Extremities:  No peripheral edema.  Neurologic: Nonfocal, moving all four extremities  Skin: No lesions  Access: NOne     Lab results: Basic Metabolic Panel: Recent Labs  Lab 09/01/23 2221 09/02/23 0414 09/03/23 0505  NA 134* 134* 136  K 5.0 5.1 5.0  CL 106 107 108  CO2 20* 19* 20*  GLUCOSE 109* 89 86  BUN 31* 33* 39*  CREATININE 3.36* 3.28* 3.55*  CALCIUM  7.8* 7.6* 8.0*  MG  --  1.9 2.0  PHOS  --  5.2* 3.7    Liver Function Tests: Recent Labs  Lab 09/01/23 2221  AST 18  ALT 5  ALKPHOS 49  BILITOT 0.4  PROT 5.6*  ALBUMIN 2.0*   No results for input(s): "LIPASE", "AMYLASE" in the last 168 hours. No results for input(s): "AMMONIA" in the last 168 hours.  CBC: Recent Labs  Lab 09/01/23 0459 09/01/23 1732 09/01/23 2221 09/02/23 0414 09/03/23 0505  WBC 14.4* 16.4* 15.4* 13.6* 12.5*  NEUTROABS  --   --  13.5*  --   --   HGB 9.0* 8.9* 8.1* 7.4* 7.3*  HCT 29.3* 29.1* 26.6* 25.0* 24.4*  MCV 88.3 89.3 88.4 89.6 87.5  PLT 312 325 296 226 280    Cardiac Enzymes: No results for input(s): "CKTOTAL", "CKMB", "CKMBINDEX", "TROPONINI" in the last 168 hours.  BNP: Invalid input(s): "POCBNP"  CBG: No results for input(s): "GLUCAP" in the last 168 hours.  Microbiology: Results for orders placed or performed during the hospital encounter of 08/31/23  Culture, blood (Routine X 2) w Reflex to ID Panel     Status: None (Preliminary result)   Collection Time: 09/01/23 10:21 PM   Specimen: BLOOD  Result Value  Ref Range Status   Specimen Description BLOOD BLOOD LEFT ARM  Final   Special Requests   Final    BOTTLES DRAWN AEROBIC AND ANAEROBIC Blood Culture adequate volume   Culture   Final    NO GROWTH 2 DAYS Performed at [facility], [address], [place], [place] [address]    Report Status PENDING  Incomplete  Culture, blood (x 2)     Status: None (Preliminary result)   Collection Time: 09/01/23 10:21 PM   Specimen: BLOOD  Result Value Ref Range Status   Specimen Description BLOOD BLOOD LEFT ARM  Final   Special Requests   Final    BOTTLES DRAWN AEROBIC AND ANAEROBIC Blood Culture adequate volume   Culture   Final    NO GROWTH 2 DAYS Performed at [facility], [address], [place], [place] [address]    Report Status PENDING  Incomplete    Coagulation Studies: Recent Labs    09/01/23  2221  LABPROT 16.3*  INR 1.3*    Urinalysis: No results for input(s): "COLORURINE", "LABSPEC", "PHURINE", "GLUCOSEU", "HGBUR", "BILIRUBINUR", "KETONESUR", "PROTEINUR", "UROBILINOGEN", "NITRITE", "LEUKOCYTESUR" in the last 72 hours.  Invalid input(s): "APPERANCEUR"    Imaging: US  RENAL Result Date: 09/02/2023 CLINICAL DATA:  Acute renal injury EXAM: RENAL / URINARY TRACT ULTRASOUND COMPLETE COMPARISON:  08/15/2023 FINDINGS: Right Kidney: Renal measurements: 14.3 x 5.9 x 6.9 cm. = volume: 305 mL. Echogenicity within normal limits. No mass or hydronephrosis visualized. 1.4 cm cyst is noted similar to that seen on prior CT examination. No follow-up is recommended. Left Kidney: Surgically removed 2 days previous Bladder: Bladder is well distended. Some dependent echogenicity is noted which may be related to debris. Correlate with urinalysis. No other focal abnormality is noted. Other: None. IMPRESSION: Status post left nephrectomy. No acute abnormality noted. Electronically Signed   By: Violeta Grey M.D.   On: 09/02/2023 19:38   NM Pulmonary Perfusion Result Date:  09/02/2023 CLINICAL DATA:  Elevated D-dimer.  Hypoxemia EXAM: NUCLEAR MEDICINE PERFUSION LUNG SCAN TECHNIQUE: Perfusion images were obtained in multiple projections after intravenous injection of radiopharmaceutical. RADIOPHARMACEUTICALS:  4.2 mCi Tc-19m MAA COMPARISON:  Radiograph 09/01/2023, CT abdomen pelvis 08/15/2023 FINDINGS: No wedge-shaped peripheral perfusion defect within LEFT or RIGHT lung to suggest acute pulmonary embolism. Normal perfusion pattern. Elevation LEFT hemidiaphragm noted IMPRESSION: No evidence acute pulmonary embolism. Electronically Signed   By: Deboraha Fallow M.D.   On: 09/02/2023 13:54   DG Chest 1 View Result Date: 09/01/2023 CLINICAL DATA:  Hypoxia EXAM: CHEST  1 VIEW COMPARISON:  None available FINDINGS: Heart and mediastinal contours within normal limits. Aortic atherosclerosis. Left basilar opacity, favor atelectasis. Free air under the right hemidiaphragm. No acute bony abnormality. IMPRESSION: Left basilar opacity, likely atelectasis. Pneumoperitoneum. Recommend clinical correlation for recent surgery. Electronically Signed   By: Janeece Mechanic M.D.   On: 09/01/2023 22:30     Assessment & Plan: Ms. Ahmira Trillo is a 73 y.o.  female with ,past medical history of hypertension and hyperlipidemi who was admitted to [facility] on 08/31/2023 for Left renal atrophy [N26.1] Obstruction of left ureteropelvic junction (UPJ) due to stone [N20.1] Hydronephrosis of left kidney [N13.30] Left renal mass [N28.89] Hydronephrosis, left [N13.30]  Acute kidney injury with hyperkalemia likely secondary to hypotension and hemodynamic instability. Left nephrectomy on 5/5. Potassium 5.3. Hypotension noted after procedure. IVF given with temporary relief of symptoms. Agree with midodrine and patient encouraged to hydrate and maintain oral intake. Continue protein supplementation as well.Will change to Nepro to reduce potassium.  Will continue to monitor renal function. No acute indication for  dialysis.      LOS: 3 Damario Gillie 5/8/20254:51 PM

## 2023-09-03 NOTE — Progress Notes (Signed)
 Urology Inpatient Progress Note  Subjective: -No acute events overnight. She is afebrile, BPs remain soft. -VQ scan yesterday negative.   -Renal ultrasound yesterday with no hydronephrosis, but a distended urinary bladder despite Foley catheter in place. Nursing flushed her Foley this morning, bladder scan 0mL. -950 mL UOP, 100 mL drain output. -WBC count down, 12.5.  Hemoglobin stable, 7.3.  Creatinine up, 3.55.  Phosphorus and magnesium WNL. -Blood cultures pending with no growth at 2 days, on antibiotics as below. -Foley catheter in place draining clear, yellow urine; JP drain in place draining serosanguineous fluid. -She reports feeling well. Her abdominal pain is improving. Her nausea has resolved. She is passing flatus since last night, though no BM yet. She would like to ambulate.  Anti-infectives: Anti-infectives (From admission, onward)    Start     Dose/Rate Route Frequency Ordered Stop   09/02/23 1800  ciprofloxacin  (CIPRO ) IVPB 400 mg        400 mg 200 mL/hr over 60 Minutes Intravenous Every 24 hours 09/02/23 1648     09/02/23 1111  vancomycin variable dose per unstable renal function (pharmacist dosing)  Status:  Discontinued         Does not apply See admin instructions 09/02/23 1111 09/02/23 1630   09/01/23 2300  vancomycin (VANCOREADY) IVPB 1750 mg/350 mL        1,750 mg 175 mL/hr over 120 Minutes Intravenous  Once 09/01/23 2211 09/02/23 0251   09/01/23 2245  metroNIDAZOLE (FLAGYL) IVPB 500 mg  Status:  Discontinued        500 mg 100 mL/hr over 60 Minutes Intravenous Every 12 hours 09/01/23 2158 09/02/23 1630   09/01/23 2245  vancomycin (VANCOCIN) IVPB 1000 mg/200 mL premix  Status:  Discontinued        1,000 mg 200 mL/hr over 60 Minutes Intravenous  Once 09/01/23 2158 09/01/23 2211   09/01/23 2215  ceFEPIme  (MAXIPIME ) 2 g in sodium chloride  0.9 % 100 mL IVPB  Status:  Discontinued        2 g 200 mL/hr over 30 Minutes Intravenous Every 24 hours 09/01/23 2158 09/02/23  1642   09/01/23 1200  ciprofloxacin  (CIPRO ) tablet 500 mg  Status:  Discontinued        500 mg Oral Daily with breakfast 09/01/23 1038 09/02/23 0842   08/31/23 2200  ciprofloxacin  (CIPRO ) tablet 500 mg  Status:  Discontinued        500 mg Oral Every 12 hours 08/31/23 1252 09/01/23 1038   08/31/23 1600  ceFAZolin (ANCEF) IVPB 1 g/50 mL premix        1 g 100 mL/hr over 30 Minutes Intravenous Every 8 hours 08/31/23 1252 09/01/23 0100   08/31/23 0619  ceFAZolin (ANCEF) IVPB 2g/100 mL premix        2 g 200 mL/hr over 30 Minutes Intravenous 30 min pre-op 08/31/23 7829 08/31/23 5621       Current Facility-Administered Medications  Medication Dose Route Frequency Provider Last Rate Last Admin   acetaminophen  (TYLENOL ) tablet 650 mg  650 mg Oral Q4H PRN [name], MD   650 mg at 09/02/23 3086   atorvastatin  (LIPITOR) tablet 20 mg  20 mg Oral Daily [name], MD   20 mg at 09/03/23 0805   Chlorhexidine Gluconate Cloth 2 % PADS 6 each  6 each Topical Daily [name], MD   6 each at 09/02/23 1415   ciprofloxacin  (CIPRO ) IVPB 400 mg  400 mg Intravenous Q24H Ananias Balls, [facility]   Stopped  at 09/02/23 1918   diphenhydrAMINE (BENADRYL) injection 12.5 mg  12.5 mg Intravenous Q6H PRN [name], MD       Or   diphenhydrAMINE (BENADRYL) 12.5 MG/5ML elixir 12.5 mg  12.5 mg Oral Q6H PRN [name], MD       docusate sodium (COLACE) capsule 100 mg  100 mg Oral BID Brandon, Ashley, MD   100 mg at 09/03/23 0806   feeding supplement (ENSURE ENLIVE / ENSURE PLUS) liquid 237 mL  237 mL Oral TID BM [name], MD   237 mL at 09/03/23 0805   heparin injection 5,000 Units  5,000 Units Subcutaneous Q8H [name], MD   5,000 Units at 09/03/23 0505   midodrine (PROAMATINE) tablet 10 mg  10 mg Oral TID WC Mansy, Jan A, MD   10 mg at 09/03/23 6568   morphine  (PF) 2 MG/ML injection 2-4 mg  2-4 mg Intravenous Q2H PRN [name], MD   4 mg at 08/31/23 1512   multivitamin with  minerals tablet 1 tablet  1 tablet Oral Daily [name], MD   1 tablet at 09/03/23 1275   ondansetron  (ZOFRAN ) injection 4 mg  4 mg Intravenous Q4H PRN [name], MD   4 mg at 09/03/23 0507   ondansetron  (ZOFRAN -ODT) disintegrating tablet 4 mg  4 mg Oral Q8H PRN [name], MD       oxybutynin (DITROPAN) tablet 5 mg  5 mg Oral Q8H PRN [name], MD       oxyCODONE -acetaminophen  (PERCOCET/ROXICET) 5-325 MG per tablet 1-2 tablet  1-2 tablet Oral Q4H PRN [name], MD   2 tablet at 09/01/23 1050   sodium bicarbonate tablet 650 mg  650 mg Oral BID Janeane Mealy, MD   650 mg at 09/03/23 1700     Objective: Vital signs in last 24 hours: Temp:  [97.6 F (36.4 C)-98.3 F (36.8 C)] 97.9 F (36.6 C) (05/08 0750) Pulse Rate:  [85-96] 85 (05/08 0750) Resp:  [16-20] 18 (05/08 0750) BP: (94-113)/(47-54) 104/47 (05/08 0750) SpO2:  [91 %-96 %] 96 % (05/08 0750)  Intake/Output from previous day: 05/07 0701 - 05/08 0700 In: 1916.4 [P.O.:120; I.V.:1496.4; IV Piggyback:300] Out: 1050 [Urine:950; Drains:100] Intake/Output this shift: No intake/output data recorded.  Physical Exam Vitals and nursing note reviewed.  Constitutional:      General: She is not in acute distress.    Appearance: She is not ill-appearing, toxic-appearing or diaphoretic.  HENT:     Head: Normocephalic and atraumatic.  Pulmonary:     Effort: Pulmonary effort is normal. No respiratory distress.  Abdominal:     Comments: Plan is soft with appropriate postoperative tenderness, no rigidity or rebound.  Incisions clean/dry/intact with overlying adhesive.  LLQ JP drain in place.  Skin:    General: Skin is warm and dry.  Neurological:     Mental Status: She is alert and oriented to person, place, and time.  Psychiatric:        Mood and Affect: Mood normal.        Behavior: Behavior normal.    Lab Results:  Recent Labs    09/02/23 0414 09/03/23 0505  WBC 13.6* 12.5*  HGB 7.4* 7.3*  HCT  25.0* 24.4*  PLT 226 280   BMET Recent Labs    09/02/23 0414 09/03/23 0505  NA 134* 136  K 5.1 5.0  CL 107 108  CO2 19* 20*  GLUCOSE 89 86  BUN 33* 39*  CREATININE 3.28* 3.55*  CALCIUM   7.6* 8.0*   PT/INR Recent Labs    09/01/23 2221  LABPROT 16.3*  INR 1.3*   Studies/Results: US  RENAL Result Date: 09/02/2023 CLINICAL DATA:  Acute renal injury EXAM: RENAL / URINARY TRACT ULTRASOUND COMPLETE COMPARISON:  08/15/2023 FINDINGS: Right Kidney: Renal measurements: 14.3 x 5.9 x 6.9 cm. = volume: 305 mL. Echogenicity within normal limits. No mass or hydronephrosis visualized. 1.4 cm cyst is noted similar to that seen on prior CT examination. No follow-up is recommended. Left Kidney: Surgically removed 2 days previous Bladder: Bladder is well distended. Some dependent echogenicity is noted which may be related to debris. Correlate with urinalysis. No other focal abnormality is noted. Other: None. IMPRESSION: Status post left nephrectomy. No acute abnormality noted. Electronically Signed   By: Violeta Grey M.D.   On: 09/02/2023 19:38   NM Pulmonary Perfusion Result Date: 09/02/2023 CLINICAL DATA:  Elevated D-dimer.  Hypoxemia EXAM: NUCLEAR MEDICINE PERFUSION LUNG SCAN TECHNIQUE: Perfusion images were obtained in multiple projections after intravenous injection of radiopharmaceutical. RADIOPHARMACEUTICALS:  4.2 mCi Tc-58m MAA COMPARISON:  Radiograph 09/01/2023, CT abdomen pelvis 08/15/2023 FINDINGS: No wedge-shaped peripheral perfusion defect within LEFT or RIGHT lung to suggest acute pulmonary embolism. Normal perfusion pattern. Elevation LEFT hemidiaphragm noted IMPRESSION: No evidence acute pulmonary embolism. Electronically Signed   By: Deboraha Fallow M.D.   On: 09/02/2023 13:54   DG Chest 1 View Result Date: 09/01/2023 CLINICAL DATA:  Hypoxia EXAM: CHEST  1 VIEW COMPARISON:  None available FINDINGS: Heart and mediastinal contours within normal limits. Aortic atherosclerosis. Left basilar  opacity, favor atelectasis. Free air under the right hemidiaphragm. No acute bony abnormality. IMPRESSION: Left basilar opacity, likely atelectasis. Pneumoperitoneum. Recommend clinical correlation for recent surgery. Electronically Signed   By: Janeece Mechanic M.D.   On: 09/01/2023 22:30   Assessment & Plan: 73 y.o. 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 Ace Holder.    Her postoperative course has been complicated by sepsis, clinically improving on empiric antibiotics; hypotension responsive to fluid bolus; acute respiratory failure with hypoxia, now on room air; and AKI with rising creatinine despite holding nephrotoxic agents.  She is clinically improving, though her creatinine continues to rise.  Will get nephrology consult today, appreciate their recommendations.  We discussed that I like to keep her until her renal function has stabilized.  She is in agreement.  Recommendations: - Continue antibiotics, appreciate hospitalist input - Continue Foley catheter and JP drain and monitor I/O - Continue IV fluids - Daily BMP, CBC - Pain control - Ambulate with nursing - Avoid nephrotoxic agents - Nephrology consult today  Kathreen Pare, PA-C 09/03/2023

## 2023-09-03 NOTE — Care Management Important Message (Signed)
 Important Message  Patient Details  Name: Victoria Clarke MRN: [id number] Date of Birth: [birth year]   Important Message Given:  Yes - Medicare IM     Anise Kerns 09/03/2023, 1:34 PM

## 2023-09-04 DIAGNOSIS — N390 Urinary tract infection, site not specified: Secondary | ICD-10-CM | POA: Diagnosis not present

## 2023-09-04 DIAGNOSIS — A4150 Gram-negative sepsis, unspecified: Secondary | ICD-10-CM | POA: Diagnosis not present

## 2023-09-04 LAB — BASIC METABOLIC PANEL WITH GFR
Anion gap: 11 (ref 5–15)
BUN: 49 mg/dL — ABNORMAL HIGH (ref 8–23)
CO2: 20 mmol/L — ABNORMAL LOW (ref 22–32)
Calcium: 8.3 mg/dL — ABNORMAL LOW (ref 8.9–10.3)
Chloride: 107 mmol/L (ref 98–111)
Creatinine, Ser: 3.66 mg/dL — ABNORMAL HIGH (ref 0.44–1.00)
GFR, Estimated: 13 mL/min — ABNORMAL LOW (ref >60)
Glucose, Bld: 96 mg/dL (ref 70–99)
Potassium: 4.6 mmol/L (ref 3.5–5.1)
Sodium: 138 mmol/L (ref 135–145)

## 2023-09-04 LAB — CBC
HCT: 26.1 % — ABNORMAL LOW (ref 36.0–46.0)
Hemoglobin: 8.1 g/dL — ABNORMAL LOW (ref 12.0–15.0)
MCH: 26.9 pg (ref 26.0–34.0)
MCHC: 31 g/dL (ref 30.0–36.0)
MCV: 86.7 fL (ref 80.0–100.0)
Platelets: 342 10*3/uL (ref 150–400)
RBC: 3.01 MIL/uL — ABNORMAL LOW (ref 3.87–5.11)
RDW: 16 % — ABNORMAL HIGH (ref 11.5–15.5)
WBC: 11.7 10*3/uL — ABNORMAL HIGH (ref 4.0–10.5)
nRBC: 0 % (ref 0.0–0.2)

## 2023-09-04 LAB — PHOSPHORUS: Phosphorus: 3.6 mg/dL (ref 2.5–4.6)

## 2023-09-04 LAB — MAGNESIUM: Magnesium: 2.3 mg/dL (ref 1.7–2.4)

## 2023-09-04 MED ORDER — SODIUM CHLORIDE 0.9 % IV SOLN: INTRAVENOUS | Status: DC

## 2023-09-04 MED ORDER — DOCUSATE SODIUM 100 MG PO CAPS
100.0000 mg | ORAL_CAPSULE | Freq: Every day | ORAL | Status: DC | PRN
Start: 2023-09-04 — End: 2023-09-06

## 2023-09-04 NOTE — Progress Notes (Signed)
 Urology Inpatient Progress Note  Subjective: No acute events overnight. She is afebrile, VSS. Creatinine slightly up today, 3.66.  eGFR is stable at 13.  White count down, 11.7.  Hemoglobin up, 8.1.  Blood cultures pending with no growth at 3 days, on antibiotics as below. 850 mL UOP.  Drain output 70 mL. Foley draining clear, yellow urine. JP drain with serosanguineous fluid. Today she reports she is feeling better overall, though somewhat weak with ambulation. She is having BMs and reports diarrhea.   Anti-infectives: Anti-infectives (From admission, onward)    Start     Dose/Rate Route Frequency Ordered Stop   09/03/23 1800  cefTAZidime  (FORTAZ ) 2 g in sodium chloride  0.9 % 100 mL IVPB        2 g 200 mL/hr over 30 Minutes Intravenous Every 24 hours 09/03/23 1420     09/02/23 1800  ciprofloxacin  (CIPRO ) IVPB 400 mg  Status:  Discontinued        400 mg 200 mL/hr over 60 Minutes Intravenous Every 24 hours 09/02/23 1648 09/03/23 1419   09/02/23 1111  vancomycin  variable dose per unstable renal function (pharmacist dosing)  Status:  Discontinued         Does not apply See admin instructions 09/02/23 1111 09/02/23 1630   09/01/23 2300  vancomycin  (VANCOREADY) IVPB 1750 mg/350 mL        1,750 mg 175 mL/hr over 120 Minutes Intravenous  Once 09/01/23 2211 09/02/23 0251   09/01/23 2245  metroNIDAZOLE  (FLAGYL ) IVPB 500 mg  Status:  Discontinued        500 mg 100 mL/hr over 60 Minutes Intravenous Every 12 hours 09/01/23 2158 09/02/23 1630   09/01/23 2245  vancomycin  (VANCOCIN ) IVPB 1000 mg/200 mL premix  Status:  Discontinued        1,000 mg 200 mL/hr over 60 Minutes Intravenous  Once 09/01/23 2158 09/01/23 2211   09/01/23 2215  ceFEPIme  (MAXIPIME ) 2 g in sodium chloride  0.9 % 100 mL IVPB  Status:  Discontinued        2 g 200 mL/hr over 30 Minutes Intravenous Every 24 hours 09/01/23 2158 09/02/23 1642   09/01/23 1200  ciprofloxacin  (CIPRO ) tablet 500 mg  Status:  Discontinued        500 mg  Oral Daily with breakfast 09/01/23 1038 09/02/23 0842   08/31/23 2200  ciprofloxacin  (CIPRO ) tablet 500 mg  Status:  Discontinued        500 mg Oral Every 12 hours 08/31/23 1252 09/01/23 1038   08/31/23 1600  ceFAZolin  (ANCEF ) IVPB 1 g/50 mL premix        1 g 100 mL/hr over 30 Minutes Intravenous Every 8 hours 08/31/23 1252 09/01/23 0100   08/31/23 0619  ceFAZolin  (ANCEF ) IVPB 2g/100 mL premix        2 g 200 mL/hr over 30 Minutes Intravenous 30 min pre-op 08/31/23 0272 08/31/23 0828       Current Facility-Administered Medications  Medication Dose Route Frequency Provider Last Rate Last Admin   acetaminophen  (TYLENOL ) tablet 650 mg  650 mg Oral Q4H PRN [name], MD   650 mg at 09/02/23 5366   atorvastatin  (LIPITOR) tablet 20 mg  20 mg Oral Daily [name], MD   20 mg at 09/04/23 0815   cefTAZidime  (FORTAZ ) 2 g in sodium chloride  0.9 % 100 mL IVPB  2 g Intravenous Q24H Janeane Mealy, MD 200 mL/hr at 09/03/23 1709 2 g at 09/03/23 1709   Chlorhexidine  Gluconate Cloth 2 % PADS  6 each  6 each Topical Daily [name], MD   6 each at 09/03/23 1015   diphenhydrAMINE  (BENADRYL ) injection 12.5 mg  12.5 mg Intravenous Q6H PRN [name], MD       Or   diphenhydrAMINE  (BENADRYL ) 12.5 MG/5ML elixir 12.5 mg  12.5 mg Oral Q6H PRN [name], MD       docusate sodium  (COLACE) capsule 100 mg  100 mg Oral BID [name], MD   100 mg at 09/03/23 2101   feeding supplement (ENSURE ENLIVE / ENSURE PLUS) liquid 237 mL  237 mL Oral TID BM [name], MD   237 mL at 09/04/23 0815   heparin  injection 5,000 Units  5,000 Units Subcutaneous Q8H [name], MD   5,000 Units at 09/04/23 0541   midodrine  (PROAMATINE ) tablet 10 mg  10 mg Oral TID WC Mansy, Jan A, MD   10 mg at 09/04/23 0815   morphine  (PF) 2 MG/ML injection 2-4 mg  2-4 mg Intravenous Q2H PRN [name], MD   4 mg at 08/31/23 1512   multivitamin with minerals tablet 1 tablet  1 tablet Oral Daily [name], MD   1 tablet at 09/04/23 0815   ondansetron  (ZOFRAN ) injection 4 mg  4 mg Intravenous Q4H PRN [name], MD   4 mg at 09/03/23 0507   ondansetron  (ZOFRAN -ODT) disintegrating tablet 4 mg  4 mg Oral Q8H PRN [name], MD       oxybutynin  (DITROPAN ) tablet 5 mg  5 mg Oral Q8H PRN [name], MD       oxyCODONE -acetaminophen  (PERCOCET/ROXICET) 5-325 MG per tablet 1-2 tablet  1-2 tablet Oral Q4H PRN [name], MD   2 tablet at 09/01/23 1050   sodium bicarbonate  tablet 650 mg  650 mg Oral BID Janeane Mealy, MD   650 mg at 09/04/23 0815     Objective: Vital signs in last 24 hours: Temp:  [97.7 F (36.5 C)-98.3 F (36.8 C)] 97.7 F (36.5 C) (05/09 0750) Pulse Rate:  [82-90] 90 (05/09 0750) Resp:  [16-20] 16 (05/09 0750) BP: (111-126)/(53-67) 113/54 (05/09 0750) SpO2:  [91 %-93 %] 93 % (05/09 0750)  Intake/Output from previous day: 05/08 0701 - 05/09 0700 In: 520 [P.O.:520] Out: 920 [Urine:850; Drains:70] Intake/Output this shift: No intake/output data recorded.  Physical Exam Vitals and nursing note reviewed.  Constitutional:      General: She is not in acute distress.    Appearance: She is not ill-appearing, toxic-appearing or diaphoretic.  Pulmonary:     Effort: Pulmonary effort is normal. No respiratory distress.  Abdominal:     Comments: Abdomen is soft without rigidity or rebound.  Incisions clean/dry/intact.  No surrounding erythema.  Skin:    General: Skin is warm and dry.  Neurological:     Mental Status: She is alert and oriented to person, place, and time.  Psychiatric:        Mood and Affect: Mood normal.        Behavior: Behavior normal.    Lab Results:  Recent Labs    09/03/23 0505 09/04/23 0530  WBC 12.5* 11.7*  HGB 7.3* 8.1*  HCT 24.4* 26.1*  PLT 280 342   BMET Recent Labs    09/03/23 0505 09/04/23 0530  NA 136 138  K 5.0 4.6  CL 108 107  CO2 20* 20*  GLUCOSE 86 96  BUN 39* 49*  CREATININE 3.55* 3.66*   CALCIUM  8.0* 8.3*   PT/INR Recent Labs  09/01/23 2221  LABPROT 16.3*  INR 1.3*   Studies/Results: US  RENAL Result Date: 09/02/2023 CLINICAL DATA:  Acute renal injury EXAM: RENAL / URINARY TRACT ULTRASOUND COMPLETE COMPARISON:  08/15/2023 FINDINGS: Right Kidney: Renal measurements: 14.3 x 5.9 x 6.9 cm. = volume: 305 mL. Echogenicity within normal limits. No mass or hydronephrosis visualized. 1.4 cm cyst is noted similar to that seen on prior CT examination. No follow-up is recommended. Left Kidney: Surgically removed 2 days previous Bladder: Bladder is well distended. Some dependent echogenicity is noted which may be related to debris. Correlate with urinalysis. No other focal abnormality is noted. Other: None. IMPRESSION: Status post left nephrectomy. No acute abnormality noted. Electronically Signed   By: Violeta Grey M.D.   On: 09/02/2023 19:38   NM Pulmonary Perfusion Result Date: 09/02/2023 CLINICAL DATA:  Elevated D-dimer.  Hypoxemia EXAM: NUCLEAR MEDICINE PERFUSION LUNG SCAN TECHNIQUE: Perfusion images were obtained in multiple projections after intravenous injection of radiopharmaceutical. RADIOPHARMACEUTICALS:  4.2 mCi Tc-53m MAA COMPARISON:  Radiograph 09/01/2023, CT abdomen pelvis 08/15/2023 FINDINGS: No wedge-shaped peripheral perfusion defect within LEFT or RIGHT lung to suggest acute pulmonary embolism. Normal perfusion pattern. Elevation LEFT hemidiaphragm noted IMPRESSION: No evidence acute pulmonary embolism. Electronically Signed   By: Deboraha Fallow M.D.   On: 09/02/2023 13:54   Assessment & Plan: 73 y.o. 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 Ace Holder.  Her postoperative course has been complicated by sepsis, clinically improving on empiric antibiotics; hypotension responsive to fluid boluses; acute respiratory failure with  hypoxia, now on room air; and AKI with rising creatinine despite holding nephrotoxic agents.  Blood counts are improving today, creatinine has risen slightly, suspect this is starting to plateau.  Will plan to keep her at least 1 more day and continue to monitor her renal function.  Strongly encourage ambulation, PT consult ordered this morning.  Appreciate nephrology and hospitalist involvement.  Recommendations: -Continue antibiotics -Continue Foley catheter and JP drain and monitor I/O; anticipate removal immediately prior to discharge -Continue IVFs -Daily BMP, CBC -Pain control -Ambulate with PT; consult ordered -Avoid nephrotoxic agents  Kathreen Pare, PA-C 09/04/2023

## 2023-09-04 NOTE — Consult Note (Signed)
 [place]   PATIENT NAME: Victoria Clarke    MR#:  [id number]  DATE OF BIRTH:  October 26, 1950  DATE OF ADMISSION:  08/31/2023  PRIMARY CARE PHYSICIAN: Carollynn Cirri, NP   Patient is coming from: Home  REQUESTING/REFERRING PHYSICIAN: [name], MD  CHIEF COMPLAINT:  Suspected sepsis  HISTORY OF PRESENT ILLNESS:  Victoria Clarke is a 74 y.o. 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 Ace Holder with ureteral stent removal.  The patient had an unremarkable night and this afternoon has been having worsening creatinine as well as leukocytosis, tachycardia and hypotension that responded to fluid bolus.  She has a Foley catheter and has been having mildly diminished urine output.  No cough or wheezing or dyspnea.  No nausea or vomiting or abdominal pain beyond her post nephrectomy mild tenderness and discomfort.  No chest pain or palpitations.  No bleeding diathesis.  Vital signs this evening and dropped to 75/47 with hydration came up to 97/50 with heart rate of 107 earlier 97.  BP later on was 103/54 with a MAP of 70.  She was slightly hypoxic was placed on 2.5-3 L of O2 per nasal cannula with pulse oximetry of 99 to 100%.  Labs revealed stat CBC which showed the status of 15.4 compared to 16.4 earlier and hemoglobin of 8.1 with hematocrit 26.6 compared to 8.9 and 29.1 earlier, CMP with mild hyponatremia of 134 and CO2 of 20 with glucose of 109, BUN of 31 with creatinine 3.36 compared to 3.22 earlier this afternoon and 2.81 this morning.  INR is 1.3 with PT 16.3 and PTT 40.  Blood cultures were drawn.  Surgical pathology revealed renal calculi with obstructive uropathy and hydronephrosis with extensive cortical atrophy, acute and chronic inflammation and incidental benign adrenal gland with no evidence for malignancy. EKG as reviewed by me :  Last EKG on 5/30 revealed normal sinus rhythm with a rate of 98. Imaging: Portable chest x-ray showed left basilar opacity likely atelectasis and postoperative pneumoperitoneum.  We are consulted for medical evaluation and management. The patient was given IV normal saline bolus and was started on broad-spectrum IV antibiotic therapy will be mentioned below PAST MEDICAL HISTORY:   Past Medical History:  Diagnosis Date   Colon polyp    Former smoker    High cholesterol    Hydronephrosis of left kidney    Hypertension    Left renal atrophy    Normocytic anemia    Obesity    Recurrent UTI (urinary tract infection)    Sepsis (HCC)    Ureteral obstruction, left     PAST SURGICAL HISTORY:   Past Surgical History:  Procedure Laterality Date   BREAST BIOPSY Left 04-26-14   ductal hyperplasia, 1 mm papilloma.   BREAST SURGERY Right 10/25/2009   milk duct excision in [place] , atypical duct epithelial proliferation l   CESAREAN SECTION  1981   CHOLECYSTECTOMY  2006   COLONOSCOPY  2016   Dr Felicita Horns   COLONOSCOPY WITH PROPOFOL  N/A 07/30/2022   Procedure: COLONOSCOPY WITH PROPOFOL ;  Surgeon: Luke Salaam, MD;  Location: [facility] ENDOSCOPY;  Service: Gastroenterology;  Laterality: N/A;   CYSTOSCOPY W/ URETERAL STENT PLACEMENT Left 07/22/2023   Procedure: CYSTOSCOPY, WITH RETROGRADE PYELOGRAM AND URETERAL STENT INSERTION;  Surgeon: [name], MD;  Location: [facility] ORS;  Service: Urology;  Laterality: Left;   CYSTOSCOPY W/ URETERAL STENT REMOVAL Left  08/31/2023   Procedure: REMOVAL, STENT, URETER;  Surgeon: [name], MD;  Location: [facility] ORS;  Service: Urology;  Laterality: Left;   EXCISION / BIOPSY BREAST / NIPPLE / DUCT Right 2011   duct excision   LAPAROSCOPIC NEPHRECTOMY, HAND ASSISTED Left 08/31/2023   Procedure: NEPHRECTOMY, HAND-ASSISTED, LAPAROSCOPIC;  Surgeon: [name], MD;  Location: [facility] ORS;  Service: Urology;  Laterality: Left;    SOCIAL HISTORY:   Social History    Tobacco Use   Smoking status: Former    Current packs/day: 0.00    Average packs/day: 1 pack/day for 30.0 years (30.0 ttl pk-yrs)    Types: Cigarettes    Start date: 04/28/1969    Quit date: 04/29/1999    Years since quitting: 24.3   Smokeless tobacco: Never  Substance Use Topics   Alcohol use: No    FAMILY HISTORY:   Family History  Problem Relation Age of Onset   Breast cancer Mother 10       Low grade DCIS   Dementia Mother    Dementia Father    Colon polyps Father    Breast cancer Sister 51   Healthy Sister    Cancer Maternal Aunt        breast/great Aunt    DRUG ALLERGIES:  No Known Allergies    MEDICATIONS AT HOME:   Prior to Admission medications   Medication Sig Start Date End Date Taking? Authorizing Provider  amLODipine -olmesartan  (AZOR ) 10-40 MG tablet TAKE 1 TABLET DAILY Patient taking differently: Take 1 tablet by mouth every morning. 12/24/22  Yes Carollynn Cirri, NP  atorvastatin  (LIPITOR) 20 MG tablet TAKE 1 TABLET DAILY Patient taking differently: Take 20 mg by mouth every morning. 06/23/23  Yes Baity, Rankin Buzzard, NP  ciprofloxacin  (CIPRO ) 500 MG tablet Take 1 tablet (500 mg total) by mouth every 12 (twelve) hours. 08/28/23  Yes [name], MD  ondansetron  (ZOFRAN -ODT) 4 MG disintegrating tablet Take 1 tablet (4 mg total) by mouth every 8 (eight) hours as needed for nausea or vomiting. 08/17/23  Yes Stoioff, Kizzie Perks, MD  tamsulosin  (FLOMAX ) 0.4 MG CAPS capsule Take 1 capsule (0.4 mg total) by mouth daily after breakfast. 07/25/23  Yes Melvinia Stager, MD      VITAL SIGNS:  Blood pressure (!) 117/57, pulse 74, temperature 97.8 F (36.6 C), resp. rate 16, height 5\' 4"  (1.626 m), weight 78.5 kg, SpO2 96%.  PHYSICAL EXAMINATION:  Physical Exam  NAD CTAB save for rales at base RRR Abdomen soft, healing lap sites on left, drain on left. Mild tenderness Has urinary catheter draining clear yellow urine No LE edema  LABORATORY PANEL:   CBC Recent Labs   Lab 09/04/23 0530  WBC 11.7*  HGB 8.1*  HCT 26.1*  PLT 342   ------------------------------------------------------------------------------------------------------------------  Chemistries  Recent Labs  Lab 09/01/23 2221 09/02/23 0414 09/04/23 0530  NA 134*   < > 138  K 5.0   < > 4.6  CL 106   < > 107  CO2 20*   < > 20*  GLUCOSE 109*   < > 96  BUN 31*   < > 49*  CREATININE 3.36*   < > 3.66*  CALCIUM  7.8*   < > 8.3*  MG  --    < > 2.3  AST 18  --   --   ALT 5  --   --   ALKPHOS 49  --   --   BILITOT 0.4  --   --    < > =  values in this interval not displayed.   ------------------------------------------------------------------------------------------------------------------  Cardiac Enzymes No results for input(s): "TROPONINI" in the last 168 hours. ------------------------------------------------------------------------------------------------------------------  RADIOLOGY:  US  RENAL Result Date: 09/02/2023 CLINICAL DATA:  Acute renal injury EXAM: RENAL / URINARY TRACT ULTRASOUND COMPLETE COMPARISON:  08/15/2023 FINDINGS: Right Kidney: Renal measurements: 14.3 x 5.9 x 6.9 cm. = volume: 305 mL. Echogenicity within normal limits. No mass or hydronephrosis visualized. 1.4 cm cyst is noted similar to that seen on prior CT examination. No follow-up is recommended. Left Kidney: Surgically removed 2 days previous Bladder: Bladder is well distended. Some dependent echogenicity is noted which may be related to debris. Correlate with urinalysis. No other focal abnormality is noted. Other: None. IMPRESSION: Status post left nephrectomy. No acute abnormality noted. Electronically Signed   By: Violeta Grey M.D.   On: 09/02/2023 19:38      IMPRESSION AND PLAN:  Assessment and Plan: * Sepsis due to gram-negative UTI (HCC) hypotension 4/30 culture with pseudomonas - given aki have discontinued cefepime  and started cipro . Nausea with that, will reach out to pharmacy regarding other  options, started on ceftaz last night and tolerating that - continue midodrine   Acute respiratory failure with hypoxia (HCC) - She had an elevated dimer of 3.72 and was 4.91 earlier during the day. - weaned to room air currently --vq scan neg  AKI With decreased urine output last few days. Now with just one functional kidney. Received contrast 4/19. Received cefepime  here and arb. Uop has improved, 1 liter last 24. Renal u/s no hydro   - cont sodium bicarb for metabolic acidosis - continue ivf - nephrology consulted  - continuing foley  Anemia Starting hgb of around 10, drifted to 7s, 8s today, no bleeding - trend for now  H/O left nephrectomy - This is secondary to atrophy and hydronephrosis with pyonephrosis.    Raymonde Calico M.D on 09/04/2023 at 2:48 PM  [facility]   From 7 PM-7 AM, contact night-coverage [email or web address]

## 2023-09-04 NOTE — Plan of Care (Signed)

## 2023-09-04 NOTE — Progress Notes (Signed)
 [facility]  ROUNDING NOTE   Subjective:   Patient seen resting in bed Appetite remains poor but continues to consume Ensure supplements  Improved urinary output, 850 mL Creatinine 4.66 from 3.55  Objective:  Vital signs in last 24 hours:  Temp:  [97.7 F (36.5 C)-98.3 F (36.8 C)] 97.8 F (36.6 C) (05/09 1405) Pulse Rate:  [74-90] 74 (05/09 1405) Resp:  [16-20] 16 (05/09 1405) BP: (111-126)/(53-67) 117/57 (05/09 1405) SpO2:  [91 %-96 %] 96 % (05/09 1405) Weight:  [78.5 kg] 78.5 kg (05/09 0702)  Weight change:  Filed Weights   08/31/23 1316 09/01/23 1512 09/04/23 0702  Weight: 72.8 kg 77.7 kg 78.5 kg    Intake/Output: I/O last 3 completed shifts: In: 2316.4 [P.O.:520; I.V.:1496.4; IV Piggyback:300] Out: 1670 [Urine:1550; Drains:120]   Intake/Output this shift:  No intake/output data recorded.  Physical Exam: General: NAD  Head: Normocephalic, atraumatic. Moist oral mucosal membranes  Eyes: Anicteric  Lungs:  Clear to auscultation, normal effort, room air  Heart: Regular rate and rhythm  Abdomen:  Soft, nontender, nondistended  Extremities: No peripheral edema.  Neurologic: Nonfocal, moving all four extremities  Skin: No lesions  Access: None    Basic Metabolic Panel: Recent Labs  Lab 09/01/23 1732 09/01/23 2221 09/02/23 0414 09/03/23 0505 09/04/23 0530  NA 136 134* 134* 136 138  K 5.3* 5.0 5.1 5.0 4.6  CL 105 106 107 108 107  CO2 20* 20* 19* 20* 20*  GLUCOSE 104* 109* 89 86 96  BUN 31* 31* 33* 39* 49*  CREATININE 3.22* 3.36* 3.28* 3.55* 3.66*  CALCIUM  8.1* 7.8* 7.6* 8.0* 8.3*  MG  --   --  1.9 2.0 2.3  PHOS  --   --  5.2* 3.7 3.6    Liver Function Tests: Recent Labs  Lab 09/01/23 2221  AST 18  ALT 5  ALKPHOS 49  BILITOT 0.4  PROT 5.6*  ALBUMIN  2.0*   No results for input(s): "LIPASE", "AMYLASE" in the last 168 hours. No results for input(s): "AMMONIA" in the last 168 hours.  CBC: Recent Labs  Lab 09/01/23 1732  09/01/23 2221 09/02/23 0414 09/03/23 0505 09/04/23 0530  WBC 16.4* 15.4* 13.6* 12.5* 11.7*  NEUTROABS  --  13.5*  --   --   --   HGB 8.9* 8.1* 7.4* 7.3* 8.1*  HCT 29.1* 26.6* 25.0* 24.4* 26.1*  MCV 89.3 88.4 89.6 87.5 86.7  PLT 325 296 226 280 342    Cardiac Enzymes: No results for input(s): "CKTOTAL", "CKMB", "CKMBINDEX", "TROPONINI" in the last 168 hours.  BNP: Invalid input(s): "POCBNP"  CBG: No results for input(s): "GLUCAP" in the last 168 hours.  Microbiology: Results for orders placed or performed during the hospital encounter of 08/31/23  Culture, blood (Routine X 2) w Reflex to ID Panel     Status: None (Preliminary result)   Collection Time: 09/01/23 10:21 PM   Specimen: BLOOD  Result Value Ref Range Status   Specimen Description BLOOD BLOOD LEFT ARM  Final   Special Requests   Final    BOTTLES DRAWN AEROBIC AND ANAEROBIC Blood Culture adequate volume   Culture   Final    NO GROWTH 3 DAYS Performed at [facility], [address] [place], [place] [address]    Report Status PENDING  Incomplete  Culture, blood (x 2)     Status: None (Preliminary result)   Collection Time: 09/01/23 10:21 PM   Specimen: BLOOD  Result Value Ref Range Status  Specimen Description BLOOD BLOOD LEFT ARM  Final   Special Requests   Final    BOTTLES DRAWN AEROBIC AND ANAEROBIC Blood Culture adequate volume   Culture   Final    NO GROWTH 3 DAYS Performed at [facility], [address] [place], [place] [address]    Report Status PENDING  Incomplete    Coagulation Studies: Recent Labs    09/01/23 09/30/2219  LABPROT 16.3*  INR 1.3*    Urinalysis: No results for input(s): "COLORURINE", "LABSPEC", "PHURINE", "GLUCOSEU", "HGBUR", "BILIRUBINUR", "KETONESUR", "PROTEINUR", "UROBILINOGEN", "NITRITE", "LEUKOCYTESUR" in the last 72 hours.  Invalid input(s): "APPERANCEUR"    Imaging: US  RENAL Result Date: 09/02/2023 CLINICAL DATA:  Acute renal injury EXAM: RENAL /  URINARY TRACT ULTRASOUND COMPLETE COMPARISON:  08/15/2023 FINDINGS: Right Kidney: Renal measurements: 14.3 x 5.9 x 6.9 cm. = volume: 305 mL. Echogenicity within normal limits. No mass or hydronephrosis visualized. 1.4 cm cyst is noted similar to that seen on prior CT examination. No follow-up is recommended. Left Kidney: Surgically removed 2 days previous Bladder: Bladder is well distended. Some dependent echogenicity is noted which may be related to debris. Correlate with urinalysis. No other focal abnormality is noted. Other: None. IMPRESSION: Status post left nephrectomy. No acute abnormality noted. Electronically Signed   By: Violeta Grey M.D.   On: 09/02/2023 19:38     Medications:    sodium chloride  75 mL/hr at 09/04/23 1106   cefTAZidime  (FORTAZ )  IV 2 g (09/03/23 1709)    atorvastatin   20 mg Oral Daily   Chlorhexidine  Gluconate Cloth  6 each Topical Daily   feeding supplement  237 mL Oral TID BM   heparin  injection (subcutaneous)  5,000 Units Subcutaneous Q8H   midodrine   10 mg Oral TID WC   multivitamin with minerals  1 tablet Oral Daily   sodium bicarbonate   650 mg Oral BID   acetaminophen , diphenhydrAMINE  **OR** diphenhydrAMINE , docusate sodium , morphine  injection, ondansetron , ondansetron , oxybutynin , oxyCODONE -acetaminophen   Assessment/ Plan:  [name] is a 73 y.o.  female with past medical history of hypertension and hyperlipidemia.  Has been admitted for Left renal atrophy [N26.1] Obstruction of left ureteropelvic junction (UPJ) due to stone [N20.1] Hydronephrosis of left kidney [N13.30] Left renal mass [N28.89] Hydronephrosis, left [N13.30]   Acute kidney injury with hyperkalemia likely secondary to hypotension and hemodynamic instability. Left nephrectomy on 5/5. Potassium 5.3. Hypotension noted after procedure. IVF given with temporary relief of symptoms. Agree with midodrine .  Creatinine continues to rise just slightly.  Oral intake has improved some.  Will  order IV hydration for 24 hours and assess patient in AM.  Potassium 4.6 today.  No acute indication for dialysis at this time.  Lab Results  Component Value Date   CREATININE 3.66 (H) 09/04/2023   CREATININE 3.55 (H) 09/03/2023   CREATININE 3.28 (H) 09/02/2023    Intake/Output Summary (Last 24 hours) at 09/04/2023 1417 Last data filed at 09/04/2023 0545 Gross per 24 hour  Intake 400 ml  Output 920 ml  Net -520 ml   2.  Anemia with acute blood loss, recent surgical procedure.  Hemoglobin 8.1 from 7.3.  Will continue to monitor for now and assess need for ESA.    LOS: 4 Victoria Clarke 5/9/20252:17 PM

## 2023-09-04 NOTE — Plan of Care (Signed)
  Problem: Education: Goal: Knowledge of General Education information will improve Description: Including pain rating scale, medication(s)/side effects and non-pharmacologic comfort measures Outcome: Progressing   Problem: Health Behavior/Discharge Planning: Goal: Ability to manage health-related needs will improve Outcome: Progressing   Problem: Clinical Measurements: Goal: Ability to maintain clinical measurements within normal limits will improve Outcome: Progressing Goal: Will remain free from infection Outcome: Progressing Goal: Diagnostic test results will improve Outcome: Progressing Goal: Respiratory complications will improve Outcome: Progressing Goal: Cardiovascular complication will be avoided Outcome: Progressing   Problem: Activity: Goal: Risk for activity intolerance will decrease Outcome: Progressing   Problem: Nutrition: Goal: Adequate nutrition will be maintained Outcome: Progressing   Problem: Coping: Goal: Level of anxiety will decrease Outcome: Progressing   Problem: Elimination: Goal: Will not experience complications related to bowel motility Outcome: Progressing Goal: Will not experience complications related to urinary retention Outcome: Progressing   Problem: Pain Managment: Goal: General experience of comfort will improve and/or be controlled Outcome: Progressing   Problem: Safety: Goal: Ability to remain free from injury will improve Outcome: Progressing   Problem: Skin Integrity: Goal: Risk for impaired skin integrity will decrease Outcome: Progressing   Problem: Education: Goal: Knowledge of the prescribed therapeutic regimen will improve Outcome: Progressing   Problem: Bowel/Gastric: Goal: Gastrointestinal status for postoperative course will improve Outcome: Progressing   Problem: Clinical Measurements: Goal: Postoperative complications will be avoided or minimized Outcome: Progressing   Problem: Respiratory: Goal:  Ability to achieve and maintain a regular respiratory rate will improve Outcome: Progressing   Problem: Skin Integrity: Goal: Demonstration of wound healing without infection will improve Outcome: Progressing   Problem: Urinary Elimination: Goal: Ability to avoid or minimize complications of infection will improve Outcome: Progressing Goal: Ability to achieve and maintain urine output will improve Outcome: Progressing   Problem: Fluid Volume: Goal: Hemodynamic stability will improve Outcome: Progressing   Problem: Clinical Measurements: Goal: Diagnostic test results will improve Outcome: Progressing Goal: Signs and symptoms of infection will decrease Outcome: Progressing   Problem: Respiratory: Goal: Ability to maintain adequate ventilation will improve Outcome: Progressing

## 2023-09-05 DIAGNOSIS — A4150 Gram-negative sepsis, unspecified: Secondary | ICD-10-CM | POA: Diagnosis not present

## 2023-09-05 DIAGNOSIS — N390 Urinary tract infection, site not specified: Secondary | ICD-10-CM | POA: Diagnosis not present

## 2023-09-05 LAB — BASIC METABOLIC PANEL WITH GFR
Anion gap: 6 (ref 5–15)
BUN: 52 mg/dL — ABNORMAL HIGH (ref 8–23)
CO2: 22 mmol/L (ref 22–32)
Calcium: 8.3 mg/dL — ABNORMAL LOW (ref 8.9–10.3)
Chloride: 112 mmol/L — ABNORMAL HIGH (ref 98–111)
Creatinine, Ser: 3.46 mg/dL — ABNORMAL HIGH (ref 0.44–1.00)
GFR, Estimated: 13 mL/min — ABNORMAL LOW (ref >60)
Glucose, Bld: 96 mg/dL (ref 70–99)
Potassium: 4.8 mmol/L (ref 3.5–5.1)
Sodium: 140 mmol/L (ref 135–145)

## 2023-09-05 LAB — CBC
HCT: 25.1 % — ABNORMAL LOW (ref 36.0–46.0)
Hemoglobin: 7.6 g/dL — ABNORMAL LOW (ref 12.0–15.0)
MCH: 26.5 pg (ref 26.0–34.0)
MCHC: 30.3 g/dL (ref 30.0–36.0)
MCV: 87.5 fL (ref 80.0–100.0)
Platelets: 323 10*3/uL (ref 150–400)
RBC: 2.87 MIL/uL — ABNORMAL LOW (ref 3.87–5.11)
RDW: 16.1 % — ABNORMAL HIGH (ref 11.5–15.5)
WBC: 8.9 10*3/uL (ref 4.0–10.5)
nRBC: 0 % (ref 0.0–0.2)

## 2023-09-05 MED ORDER — SODIUM BICARBONATE 650 MG PO TABS
650.0000 mg | ORAL_TABLET | Freq: Every day | ORAL | Status: DC
  Administered 2023-09-06: 650 mg via ORAL
  Filled 2023-09-05: qty 1

## 2023-09-05 NOTE — Progress Notes (Signed)
   Subjective Pain control improved today Ambulated to bathroom Appetite improved, tolerating diet  Physical Exam: BP 133/73 (BP Location: Right Arm)   Pulse (!) 101   Temp 98.7 F (37.1 C)   Resp 17   Ht 5\' 4"  (1.626 m)   Wt 80 kg   SpO2 98%   BMI 30.27 kg/m    Constitutional:  Alert and oriented, No acute distress. Respiratory: Normal respiratory effort, no increased work of breathing. GI: Abdomen is soft, appropriately tender, incisions clean dry and intact without erythema JP drain with serosanguineous drainage Foley with clear yellow urine  Laboratory Data: Reviewed in epic, renal function stable  Urine output 700 mL overnight, JP output increased  Assessment & Plan:   73 year old female with left renal atrophy with chronic obstruction and pyelonephrosis, POD#5 left hand-assisted laparoscopic nephrectomy with left ureteral stent removal with Dr. Ace Holder.  Postoperative course complicated by sepsis and hypotension with likely resulting AKI.  Nephrology and hospitalist following.  -Improved today, tolerating diet, continue to ambulate -Continue Foley and JP today -Continue antibiotics -Appreciate nephrology and hospitalist recommendations -Consider discharge tomorrow pending improvement in renal function.  Consider Foley removal and JP removal tomorrow pending clinical course and output  Lawerence Pressman, MD

## 2023-09-05 NOTE — Progress Notes (Signed)
 [facility]  ROUNDING NOTE   Subjective:  Patient seen in bed resting. No complaints. Endorses improved output from left drain tube. On room air.   UO  Objective:  Vital signs in last 24 hours:  Temp:  [97.7 F (36.5 C)-98.7 F (37.1 C)] 98.7 F (37.1 C) (05/10 0918) Pulse Rate:  [74-101] 101 (05/10 0918) Resp:  [16-20] 17 (05/10 0918) BP: (117-133)/(56-73) 133/73 (05/10 0918) SpO2:  [94 %-98 %] 98 % (05/10 0918) Weight:  [80 kg] 80 kg (05/10 0423)  Weight change:  Filed Weights   09/01/23 1512 09/04/23 0702 09/05/23 0423  Weight: 77.7 kg 78.5 kg 80 kg    Intake/Output: I/O last 3 completed shifts: In: 2018.3 [P.O.:520; I.V.:1298.3; IV Piggyback:200] Out: 1955 [Urine:1500; Drains:455]   Intake/Output this shift:  Total I/O In: -  Out: 840 [Urine:700; Drains:140]  Physical Exam: General: NAD,   Head: Normocephalic, atraumatic. Moist oral mucosal membranes  Eyes: Anicteric, PERRL  Neck: Supple, trachea midline  Lungs:  Clear to auscultation  Heart: Regular rate and rhythm  Abdomen:  Soft, nontender,   Extremities:  No peripheral edema.  Neurologic: Alert and oriented, moving all four extremities  Skin: No lesions  Access: None    Basic Metabolic Panel: Recent Labs  Lab 09/01/23 2221 09/02/23 0414 09/03/23 0505 09/04/23 0530 09/05/23 0419  NA 134* 134* 136 138 140  K 5.0 5.1 5.0 4.6 4.8  CL 106 107 108 107 112*  CO2 20* 19* 20* 20* 22  GLUCOSE 109* 89 86 96 96  BUN 31* 33* 39* 49* 52*  CREATININE 3.36* 3.28* 3.55* 3.66* 3.46*  CALCIUM  7.8* 7.6* 8.0* 8.3* 8.3*  MG  --  1.9 2.0 2.3  --   PHOS  --  5.2* 3.7 3.6  --     Liver Function Tests: Recent Labs  Lab 09/01/23 2221  AST 18  ALT 5  ALKPHOS 49  BILITOT 0.4  PROT 5.6*  ALBUMIN  2.0*   No results for input(s): "LIPASE", "AMYLASE" in the last 168 hours. No results for input(s): "AMMONIA" in the last 168 hours.  CBC: Recent Labs  Lab 09/01/23 2221 09/02/23 0414  09/03/23 0505 09/04/23 0530 09/05/23 0419  WBC 15.4* 13.6* 12.5* 11.7* 8.9  NEUTROABS 13.5*  --   --   --   --   HGB 8.1* 7.4* 7.3* 8.1* 7.6*  HCT 26.6* 25.0* 24.4* 26.1* 25.1*  MCV 88.4 89.6 87.5 86.7 87.5  PLT 296 226 280 342 323    Cardiac Enzymes: No results for input(s): "CKTOTAL", "CKMB", "CKMBINDEX", "TROPONINI" in the last 168 hours.  BNP: Invalid input(s): "POCBNP"  CBG: No results for input(s): "GLUCAP" in the last 168 hours.  Microbiology: Results for orders placed or performed during the hospital encounter of 08/31/23  Culture, blood (Routine X 2) w Reflex to ID Panel     Status: None (Preliminary result)   Collection Time: 09/01/23 10:21 PM   Specimen: BLOOD  Result Value Ref Range Status   Specimen Description BLOOD BLOOD LEFT ARM  Final   Special Requests   Final    BOTTLES DRAWN AEROBIC AND ANAEROBIC Blood Culture adequate volume   Culture   Final    NO GROWTH 4 DAYS Performed at [facility], [address] [place], [place] [address]    Report Status PENDING  Incomplete  Culture, blood (x 2)     Status: None (Preliminary result)   Collection Time: 09/01/23 10:21 PM   Specimen: BLOOD  Result Value Ref Range Status   Specimen Description BLOOD BLOOD LEFT ARM  Final   Special Requests   Final    BOTTLES DRAWN AEROBIC AND ANAEROBIC Blood Culture adequate volume   Culture   Final    NO GROWTH 4 DAYS Performed at [facility], [address] [place], [place] [address]    Report Status PENDING  Incomplete    Coagulation Studies: No results for input(s): "LABPROT", "INR" in the last 72 hours.  Urinalysis: No results for input(s): "COLORURINE", "LABSPEC", "PHURINE", "GLUCOSEU", "HGBUR", "BILIRUBINUR", "KETONESUR", "PROTEINUR", "UROBILINOGEN", "NITRITE", "LEUKOCYTESUR" in the last 72 hours.  Invalid input(s): "APPERANCEUR"    Imaging: No results found.   Medications:    sodium chloride  75 mL/hr at 09/05/23 0500    cefTAZidime  (FORTAZ )  IV Stopped (09/04/23 1731)    atorvastatin   20 mg Oral Daily   Chlorhexidine  Gluconate Cloth  6 each Topical Daily   feeding supplement  237 mL Oral TID BM   heparin  injection (subcutaneous)  5,000 Units Subcutaneous Q8H   midodrine   10 mg Oral TID WC   multivitamin with minerals  1 tablet Oral Daily   sodium bicarbonate   650 mg Oral BID   acetaminophen , diphenhydrAMINE  **OR** diphenhydrAMINE , docusate sodium , morphine  injection, ondansetron , ondansetron , oxybutynin , oxyCODONE -acetaminophen   Assessment/ Plan:  [name] is a 73 y.o.  female with past medical history of hypertension and hyperlipidemia.  Has been admitted for Left renal atrophy [N26.1] Obstruction of left ureteropelvic junction (UPJ) due to stone [N20.1] Hydronephrosis of left kidney [N13.30] Left renal mass [N28.89] Hydronephrosis, left [N13.30]     Acute kidney injury with hyperkalemia likely secondary to hypotension and hemodynamic instability. Left nephrectomy on 5/5. Potassium 5.3. Hypotension noted after procedure. IVF given with temporary relief of symptoms. Agree with midodrine .  Creatinine improved today.  Oral intake has improved some.  Overnight IV hydration improved renal function. Potassium 4.8 today.  No acute indication for dialysis at this time. Will continue to monitor renal indices.    Lab Results  Component Value Date   CREATININE 3.46 (H) 09/05/2023   CREATININE 3.66 (H) 09/04/2023   CREATININE 3.55 (H) 09/03/2023       Intake/Output Summary (Last 24 hours) at 09/05/2023 1006 Last data filed at 09/05/2023 0730 Gross per 24 hour  Intake 2018.29 ml  Output 2235 ml  Net -216.71 ml    2.  Anemia with acute blood loss, recent surgical procedure.  Hemoglobin 8.1 from 7.3.  Will continue to monitor for now and assess need for ESA.   LOS: 5 Victoria Clarke 5/10/202510:05 AM

## 2023-09-05 NOTE — Evaluation (Signed)
 Physical Therapy Evaluation Patient Details Name: Victoria Clarke MRN: [id number] DOB: 08-12-1950 Today's Date: 09/05/2023  History of Present Illness  73 year old female with a personal history of severe left hydroureteronephrosis secondary to obstructing stones, initially presented with sepsis with obstruction. She underwent left ureteral stent placement, which drained a significant amount of thick purulent material. With stent placement, her creatinine trended downwards.      Despite a positive urinalysis, her urine culture was negative, although she had previously grown E. coli pan-sensitive.  Patient is s/p L nephrectomy 08/31/23.   Clinical Impression  Patient received in bed, lunch in front of her but untouched. Patient is agreeable to getting up to chair to eat. She is mod I for bed mobility, transfers with cga, ambulated 4 feet to recliner with RW and cga. Declines attempting to ambulate further at this time. Limited by pain and weakness. She will continue to benefit from skilled PT to improve independence, strength, endurance.         If plan is discharge home, recommend the following: A little help with walking and/or transfers;A little help with bathing/dressing/bathroom;Help with stairs or ramp for entrance   Can travel by private vehicle    yes    Equipment Recommendations None recommended by PT  Recommendations for Other Services       Functional Status Assessment Patient has had a recent decline in their functional status and demonstrates the ability to make significant improvements in function in a reasonable and predictable amount of time.     Precautions / Restrictions Precautions Precautions: Fall Recall of Precautions/Restrictions: Intact Restrictions Weight Bearing Restrictions Per Provider Order: No      Mobility  Bed Mobility Overal bed mobility: Modified Independent             General bed mobility comments: increased time and effort needed, but no  physical assist needed    Transfers Overall transfer level: Needs assistance Equipment used: Rolling walker (2 wheels) Transfers: Sit to/from Stand Sit to Stand: Contact guard assist                Ambulation/Gait Ambulation/Gait assistance: Contact guard assist Gait Distance (Feet): 4 Feet Assistive device: Rolling walker (2 wheels) Gait Pattern/deviations: Step-to pattern, Decreased step length - right, Decreased step length - left, Decreased stride length Gait velocity: decr     General Gait Details: patient unwilling/unable to go further at this time. ( she reports she walked to and from bathroom earlier)  Careers information officer     Tilt Bed    Modified Rankin (Stroke Patients Only)       Balance Overall balance assessment: Needs assistance Sitting-balance support: Feet supported Sitting balance-Leahy Scale: Good     Standing balance support: Bilateral upper extremity supported, During functional activity, Reliant on assistive device for balance Standing balance-Leahy Scale: Fair                               Pertinent Vitals/Pain Pain Assessment Pain Assessment: Faces Faces Pain Scale: Hurts a little bit Pain Location: site of JP drain Pain Descriptors / Indicators: Discomfort, Sore Pain Intervention(s): Monitored during session, Repositioned    Home Living Family/patient expects to be discharged to:: Private residence Living Arrangements: Spouse/significant other Available Help at Discharge: Family;Available 24 hours/day Type of Home: House Home Access: Stairs to enter   [employer] of Steps: 2  Home Layout: One level Home Equipment: Agricultural consultant (2 wheels)      Prior Function Prior Level of Function : Independent/Modified Independent;Driving             Mobility Comments: was not using AD prior to admission       Extremity/Trunk Assessment   Upper Extremity Assessment Upper  Extremity Assessment: Overall WFL for tasks assessed    Lower Extremity Assessment Lower Extremity Assessment: Generalized weakness    Cervical / Trunk Assessment Cervical / Trunk Assessment: Normal  Communication   Communication Communication: No apparent difficulties    Cognition Arousal: Alert Behavior During Therapy: WFL for tasks assessed/performed   PT - Cognitive impairments: No apparent impairments                         Following commands: Intact       Cueing Cueing Techniques: Verbal cues     General Comments      Exercises     Assessment/Plan    PT Assessment Patient needs continued PT services  PT Problem List Decreased strength;Decreased activity tolerance;Decreased balance;Decreased mobility;Pain       PT Treatment Interventions DME instruction;Gait training;Stair training;Functional mobility training;Therapeutic activities;Therapeutic exercise;Patient/family education    PT Goals (Current goals can be found in the Care Plan section)  Acute Rehab PT Goals Patient Stated Goal: return home PT Goal Formulation: With patient Time For Goal Achievement: 09/19/23 Potential to Achieve Goals: Good    Frequency Min 3X/week     Co-evaluation               AM-PAC PT "6 Clicks" Mobility  Outcome Measure Help needed turning from your back to your side while in a flat bed without using bedrails?: None Help needed moving from lying on your back to sitting on the side of a flat bed without using bedrails?: None Help needed moving to and from a bed to a chair (including a wheelchair)?: A Little Help needed standing up from a chair using your arms (e.g., wheelchair or bedside chair)?: A Little Help needed to walk in hospital room?: A Lot Help needed climbing 3-5 steps with a railing? : A Lot 6 Click Score: 18    End of Session   Activity Tolerance: Patient limited by fatigue;Patient limited by pain Patient left: in chair;with call  bell/phone within reach Nurse Communication: Mobility status PT Visit Diagnosis: Other abnormalities of gait and mobility (R26.89);Muscle weakness (generalized) (M62.81);Difficulty in walking, not elsewhere classified (R26.2);Pain Pain - Right/Left: Left Pain - part of body:  (side/abdomen)    Time: 8469-6295 PT Time Calculation (min) (ACUTE ONLY): 12 min   Charges:   PT Evaluation $PT Eval Moderate Complexity: 1 Mod   PT General Charges $$ ACUTE PT VISIT: 1 Visit         Jaquetta Currier, PT, GCS 09/05/23,2:25 PM

## 2023-09-05 NOTE — Consult Note (Signed)
 [place]   PATIENT NAME: Victoria Clarke    MR#:  [id number]  DATE OF BIRTH:  October 18, 1950  DATE OF ADMISSION:  08/31/2023  PRIMARY CARE PHYSICIAN: Carollynn Cirri, NP   Patient is coming from: Home  REQUESTING/REFERRING PHYSICIAN: [name], MD  CHIEF COMPLAINT:  Suspected sepsis  HISTORY OF PRESENT ILLNESS:  Victoria Clarke is a 73 y.o. 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 Ace Holder with ureteral stent removal.  The patient had an unremarkable night and this afternoon has been having worsening creatinine as well as leukocytosis, tachycardia and hypotension that responded to fluid bolus.  She has a Foley catheter and has been having mildly diminished urine output.  No cough or wheezing or dyspnea.  No nausea or vomiting or abdominal pain beyond her post nephrectomy mild tenderness and discomfort.  No chest pain or palpitations.  No bleeding diathesis.  Vital signs this evening and dropped to 75/47 with hydration came up to 97/50 with heart rate of 107 earlier 97.  BP later on was 103/54 with a MAP of 70.  She was slightly hypoxic was placed on 2.5-3 L of O2 per nasal cannula with pulse oximetry of 99 to 100%.  Labs revealed stat CBC which showed the status of 15.4 compared to 16.4 earlier and hemoglobin of 8.1 with hematocrit 26.6 compared to 8.9 and 29.1 earlier, CMP with mild hyponatremia of 134 and CO2 of 20 with glucose of 109, BUN of 31 with creatinine 3.36 compared to 3.22 earlier this afternoon and 2.81 this morning.  INR is 1.3 with PT 16.3 and PTT 40.  Blood cultures were drawn.  Surgical pathology revealed renal calculi with obstructive uropathy and hydronephrosis with extensive cortical atrophy, acute and chronic inflammation and incidental benign adrenal gland with no evidence for malignancy. EKG as reviewed by me :  Last EKG on 5/30 revealed normal sinus rhythm with a rate of 98. Imaging: Portable chest x-ray showed left basilar opacity likely atelectasis and postoperative pneumoperitoneum.  We are consulted for medical evaluation and management. The patient was given IV normal saline bolus and was started on broad-spectrum IV antibiotic therapy will be mentioned below PAST MEDICAL HISTORY:   Past Medical History:  Diagnosis Date   Colon polyp    Former smoker    High cholesterol    Hydronephrosis of left kidney    Hypertension    Left renal atrophy    Normocytic anemia    Obesity    Recurrent UTI (urinary tract infection)    Sepsis (HCC)    Ureteral obstruction, left     PAST SURGICAL HISTORY:   Past Surgical History:  Procedure Laterality Date   BREAST BIOPSY Left 04-26-14   ductal hyperplasia, 1 mm papilloma.   BREAST SURGERY Right 10/25/2009   milk duct excision in [place] , atypical duct epithelial proliferation l   CESAREAN SECTION  1981   CHOLECYSTECTOMY  2006   COLONOSCOPY  2016   Dr Felicita Horns   COLONOSCOPY WITH PROPOFOL  N/A 07/30/2022   Procedure: COLONOSCOPY WITH PROPOFOL ;  Surgeon: Luke Salaam, MD;  Location: [facility] END[facility]COPY;  Service: Gastroenterology;  Laterality: N/A;   CYSTOSCOPY W/ URETERAL STENT PLACEMENT Left 07/22/2023   Procedure: CYSTOSCOPY, WITH RETROGRADE PYELOGRAM AND URETERAL STENT INSERTION;  Surgeon: [name], MD;  Location: [facility] ORS;  Service: Urology;  Laterality: Left;   CYSTOSCOPY W/ URETERAL STENT REMOVAL Left  08/31/2023   Procedure: REMOVAL, STENT, URETER;  Surgeon: [name], MD;  Location: [facility] ORS;  Service: Urology;  Laterality: Left;   EXCISION / BIOPSY BREAST / NIPPLE / DUCT Right 2011   duct excision   LAPAROSCOPIC NEPHRECTOMY, HAND ASSISTED Left 08/31/2023   Procedure: NEPHRECTOMY, HAND-ASSISTED, LAPAROSCOPIC;  Surgeon: [name], MD;  Location: [facility] ORS;  Service: Urology;  Laterality: Left;    SOCIAL HISTORY:   Social History    Tobacco Use   Smoking status: Former    Current packs/day: 0.00    Average packs/day: 1 pack/day for 30.0 years (30.0 ttl pk-yrs)    Types: Cigarettes    Start date: 04/28/1969    Quit date: 04/29/1999    Years since quitting: 24.3   Smokeless tobacco: Never  Substance Use Topics   Alcohol use: No    FAMILY HISTORY:   Family History  Problem Relation Age of Onset   Breast cancer Mother 61       Low grade DCIS   Dementia Mother    Dementia Father    Colon polyps Father    Breast cancer Sister 79   Healthy Sister    Cancer Maternal Aunt        breast/great Aunt    DRUG ALLERGIES:  No Known Allergies    MEDICATIONS AT HOME:   Prior to Admission medications   Medication Sig Start Date End Date Taking? Authorizing Provider  amLODipine -olmesartan  (AZOR ) 10-40 MG tablet TAKE 1 TABLET DAILY Patient taking differently: Take 1 tablet by mouth every morning. 12/24/22  Yes Carollynn Cirri, NP  atorvastatin  (LIPITOR) 20 MG tablet TAKE 1 TABLET DAILY Patient taking differently: Take 20 mg by mouth every morning. 06/23/23  Yes Baity, Rankin Buzzard, NP  ciprofloxacin  (CIPRO ) 500 MG tablet Take 1 tablet (500 mg total) by mouth every 12 (twelve) hours. 08/28/23  Yes [name], MD  ondansetron  (ZOFRAN -ODT) 4 MG disintegrating tablet Take 1 tablet (4 mg total) by mouth every 8 (eight) hours as needed for nausea or vomiting. 08/17/23  Yes Stoioff, Kizzie Perks, MD  tamsulosin  (FLOMAX ) 0.4 MG CAPS capsule Take 1 capsule (0.4 mg total) by mouth daily after breakfast. 07/25/23  Yes Melvinia Stager, MD      VITAL SIGNS:  Blood pressure 133/73, pulse (!) 101, temperature 98.7 F (37.1 C), resp. rate 17, height 5\' 4"  (1.626 m), weight 80 kg, SpO2 98%.  PHYSICAL EXAMINATION:  Physical Exam  NAD CTAB save for rales at base RRR Abdomen soft, healing lap sites on left, drain on left. Mild tenderness Has urinary catheter draining clear yellow urine No LE edema  LABORATORY PANEL:   CBC Recent Labs   Lab 09/05/23 0419  WBC 8.9  HGB 7.6*  HCT 25.1*  PLT 323   ------------------------------------------------------------------------------------------------------------------  Chemistries  Recent Labs  Lab 09/01/23 2221 09/02/23 0414 09/04/23 0530 09/05/23 0419  NA 134*   < > 138 140  K 5.0   < > 4.6 4.8  CL 106   < > 107 112*  CO2 20*   < > 20* 22  GLUCOSE 109*   < > 96 96  BUN 31*   < > 49* 52*  CREATININE 3.36*   < > 3.66* 3.46*  CALCIUM  7.8*   < > 8.3* 8.3*  MG  --    < > 2.3  --   AST 18  --   --   --   ALT 5  --   --   --  ALKPHOS 49  --   --   --   BILITOT 0.4  --   --   --    < > = values in this interval not displayed.   ------------------------------------------------------------------------------------------------------------------  Cardiac Enzymes No results for input(s): "TROPONINI" in the last 168 hours. ------------------------------------------------------------------------------------------------------------------  RADIOLOGY:  No results found.     IMPRESSION AND PLAN:  Assessment and Plan: * Sepsis due to gram-negative UTI (HCC) hypotension 4/30 culture with pseudomonas - given aki have discontinued cefepime  and started cipro . Nausea with that, will reach out to pharmacy regarding other options, started on ceftaz and tolerating that - bp has improved will d/c midodrine   Acute respiratory failure with hypoxia (HCC) - She had an elevated dimer of 3.72 and was 4.91 earlier during the day. Vq neg - weaned to room air currently  AKI With decreased urine output last few days. Now with just one functional kidney. Received contrast 4/19. Received cefepime  here and arb. Uop has improved, 1 liter last 24. Renal u/s no hydro   - cont sodium bicarb for metabolic acidosis, which is resolved, will decreased to daily - continue ivf - nephrology consulted, monitoring for now - continuing foley  Anemia Starting hgb of around 10, stable now around 8 -  trend for now  H/O left nephrectomy - This is secondary to atrophy and hydronephrosis with pyonephrosis.    Raymonde Calico M.D on 09/05/2023 at 1:31 PM  [facility]   From 7 PM-7 AM, contact night-coverage [email or web address]

## 2023-09-06 LAB — BASIC METABOLIC PANEL WITH GFR
Anion gap: 7 (ref 5–15)
BUN: 47 mg/dL — ABNORMAL HIGH (ref 8–23)
CO2: 20 mmol/L — ABNORMAL LOW (ref 22–32)
Calcium: 8.2 mg/dL — ABNORMAL LOW (ref 8.9–10.3)
Chloride: 111 mmol/L (ref 98–111)
Creatinine, Ser: 3.14 mg/dL — ABNORMAL HIGH (ref 0.44–1.00)
GFR, Estimated: 15 mL/min — ABNORMAL LOW (ref >60)
Glucose, Bld: 96 mg/dL (ref 70–99)
Potassium: 4.7 mmol/L (ref 3.5–5.1)
Sodium: 138 mmol/L (ref 135–145)

## 2023-09-06 LAB — CBC
HCT: 26.1 % — ABNORMAL LOW (ref 36.0–46.0)
Hemoglobin: 8 g/dL — ABNORMAL LOW (ref 12.0–15.0)
MCH: 26.8 pg (ref 26.0–34.0)
MCHC: 30.7 g/dL (ref 30.0–36.0)
MCV: 87.6 fL (ref 80.0–100.0)
Platelets: 287 10*3/uL (ref 150–400)
RBC: 2.98 MIL/uL — ABNORMAL LOW (ref 3.87–5.11)
RDW: 16.2 % — ABNORMAL HIGH (ref 11.5–15.5)
WBC: 7.2 10*3/uL (ref 4.0–10.5)
nRBC: 0 % (ref 0.0–0.2)

## 2023-09-06 LAB — CULTURE, BLOOD (ROUTINE X 2)
Culture: NO GROWTH
Culture: NO GROWTH
Special Requests: ADEQUATE
Special Requests: ADEQUATE

## 2023-09-06 MED ORDER — DOCUSATE SODIUM 100 MG PO CAPS: 100.0000 mg | ORAL_CAPSULE | Freq: Every day | ORAL | 0 refills | Status: DC

## 2023-09-06 MED ORDER — CIPROFLOXACIN HCL 500 MG PO TABS: 250.0000 mg | ORAL_TABLET | Freq: Two times a day (BID) | ORAL | 0 refills | Status: DC

## 2023-09-06 MED ORDER — TRAMADOL HCL 50 MG PO TABS: 25.0000 mg | ORAL_TABLET | Freq: Four times a day (QID) | ORAL | 0 refills | Status: AC | PRN

## 2023-09-06 NOTE — Plan of Care (Signed)
  Problem: Education: Goal: Knowledge of General Education information will improve Description: Including pain rating scale, medication(s)/side effects and non-pharmacologic comfort measures Outcome: Adequate for Discharge   Problem: Health Behavior/Discharge Planning: Goal: Ability to manage health-related needs will improve Outcome: Adequate for Discharge   Problem: Clinical Measurements: Goal: Ability to maintain clinical measurements within normal limits will improve Outcome: Adequate for Discharge Goal: Will remain free from infection Outcome: Adequate for Discharge Goal: Diagnostic test results will improve Outcome: Adequate for Discharge Goal: Respiratory complications will improve Outcome: Adequate for Discharge Goal: Cardiovascular complication will be avoided Outcome: Adequate for Discharge   Problem: Activity: Goal: Risk for activity intolerance will decrease Outcome: Adequate for Discharge   Problem: Elimination: Goal: Will not experience complications related to bowel motility Outcome: Adequate for Discharge Goal: Will not experience complications related to urinary retention Outcome: Adequate for Discharge   Problem: Coping: Goal: Level of anxiety will decrease Outcome: Adequate for Discharge   Problem: Pain Managment: Goal: General experience of comfort will improve and/or be controlled Outcome: Adequate for Discharge  PT D/C home, IV and JP removed. Pt educated on drain site care. AVS given and reviewed with all follow up questions answered. All the patient belongings returned. Pt belongs were returned.  BP 129/67 (BP Location: Right Arm)   Pulse 97   Temp 98.3 F (36.8 C)   Resp 17   Ht 5\' 4"  (1.626 m)   Wt 81.3 kg   SpO2 100%   BMI 30.77 kg/m  Anders Katz 09/06/23 10:33 AM

## 2023-09-06 NOTE — Progress Notes (Signed)
 [facility]  ROUNDING NOTE   Subjective:   Patient seen during rounding, resting in bed comfortably. Endorses improved appetite. No complaints to offer.   Objective:  Vital signs in last 24 hours:  Temp:  [97.5 F (36.4 C)-98.3 F (36.8 C)] 98.3 F (36.8 C) (05/11 0759) Pulse Rate:  [75-97] 97 (05/11 0759) Resp:  [16-17] 17 (05/11 0759) BP: (128-137)/(61-67) 129/67 (05/11 0759) SpO2:  [97 %-100 %] 100 % (05/11 0759) Weight:  [81.3 kg] 81.3 kg (05/11 0355)  Weight change: 2.782 kg Filed Weights   09/04/23 0702 09/05/23 0423 09/06/23 0355  Weight: 78.5 kg 80 kg 81.3 kg    Intake/Output: I/O last 3 completed shifts: In: 1349.2 [P.O.:600; I.V.:749.2] Out: 2425 [Urine:1800; Drains:625]   Intake/Output this shift:  Total I/O In: -  Out: 80 [Drains:80]  Physical Exam: General: NAD,   Head: Normocephalic, atraumatic. Moist oral mucosal membranes  Eyes: Anicteric, PERRL  Neck: Supple, trachea midline  Lungs:  Clear to auscultation  Heart: Regular rate and rhythm  Abdomen:  Soft, nontender,   Extremities:  No peripheral edema.  Neurologic: Nonfocal, moving all four extremities  Skin: No lesions  Access: none    Basic Metabolic Panel: Recent Labs  Lab 09/02/23 0414 09/03/23 0505 09/04/23 0530 09/05/23 0419 09/06/23 0426  NA 134* 136 138 140 138  K 5.1 5.0 4.6 4.8 4.7  CL 107 108 107 112* 111  CO2 19* 20* 20* 22 20*  GLUCOSE 89 86 96 96 96  BUN 33* 39* 49* 52* 47*  CREATININE 3.28* 3.55* 3.66* 3.46* 3.14*  CALCIUM  7.6* 8.0* 8.3* 8.3* 8.2*  MG 1.9 2.0 2.3  --   --   PHOS 5.2* 3.7 3.6  --   --     Liver Function Tests: Recent Labs  Lab 09/01/23 2221  AST 18  ALT 5  ALKPHOS 49  BILITOT 0.4  PROT 5.6*  ALBUMIN  2.0*   No results for input(s): "LIPASE", "AMYLASE" in the last 168 hours. No results for input(s): "AMMONIA" in the last 168 hours.  CBC: Recent Labs  Lab 09/01/23 2221 09/02/23 0414 09/03/23 0505 09/04/23 0530  09/05/23 0419 09/06/23 0426  WBC 15.4* 13.6* 12.5* 11.7* 8.9 7.2  NEUTROABS 13.5*  --   --   --   --   --   HGB 8.1* 7.4* 7.3* 8.1* 7.6* 8.0*  HCT 26.6* 25.0* 24.4* 26.1* 25.1* 26.1*  MCV 88.4 89.6 87.5 86.7 87.5 87.6  PLT 296 226 280 342 323 287    Cardiac Enzymes: No results for input(s): "CKTOTAL", "CKMB", "CKMBINDEX", "TROPONINI" in the last 168 hours.  BNP: Invalid input(s): "POCBNP"  CBG: No results for input(s): "GLUCAP" in the last 168 hours.  Microbiology: Results for orders placed or performed during the hospital encounter of 08/31/23  Culture, blood (Routine X 2) w Reflex to ID Panel     Status: None   Collection Time: 09/01/23 10:21 PM   Specimen: BLOOD  Result Value Ref Range Status   Specimen Description BLOOD BLOOD LEFT ARM  Final   Special Requests   Final    BOTTLES DRAWN AEROBIC AND ANAEROBIC Blood Culture adequate volume   Culture   Final    NO GROWTH 5 DAYS Performed at [facility], [address], [place], [place] [address]    Report Status 09/06/2023 FINAL  Final  Culture, blood (x 2)     Status: None   Collection Time: 09/01/23 10:21 PM   Specimen: BLOOD  Result  Value Ref Range Status   Specimen Description BLOOD BLOOD LEFT ARM  Final   Special Requests   Final    BOTTLES DRAWN AEROBIC AND ANAEROBIC Blood Culture adequate volume   Culture   Final    NO GROWTH 5 DAYS Performed at [facility], [address] [place], [place] [address]    Report Status 09/06/2023 FINAL  Final    Coagulation Studies: No results for input(s): "LABPROT", "INR" in the last 72 hours.  Urinalysis: No results for input(s): "COLORURINE", "LABSPEC", "PHURINE", "GLUCOSEU", "HGBUR", "BILIRUBINUR", "KETONESUR", "PROTEINUR", "UROBILINOGEN", "NITRITE", "LEUKOCYTESUR" in the last 72 hours.  Invalid input(s): "APPERANCEUR"    Imaging: No results found.   Medications:    cefTAZidime  (FORTAZ )  IV 2 g (09/05/23 1915)    atorvastatin   20 mg Oral  Daily   Chlorhexidine  Gluconate Cloth  6 each Topical Daily   feeding supplement  237 mL Oral TID BM   heparin  injection (subcutaneous)  5,000 Units Subcutaneous Q8H   multivitamin with minerals  1 tablet Oral Daily   sodium bicarbonate   650 mg Oral Daily   acetaminophen , diphenhydrAMINE  **OR** diphenhydrAMINE , docusate sodium , morphine  injection, ondansetron , ondansetron , oxybutynin , oxyCODONE -acetaminophen   Assessment/ Plan:  [name] is a 73 y.o.  female with past medical history of hypertension and hyperlipidemia.  Has been admitted for Left renal atrophy [N26.1] Obstruction of left ureteropelvic junction (UPJ) due to stone [N20.1] Hydronephrosis of left kidney [N13.30] Left renal mass [N28.89] Hydronephrosis, left [N13.30]     Acute kidney injury with hyperkalemia likely secondary to hypotension and hemodynamic instability. Left nephrectomy on 5/5. Potassium 5.3. Hypotension noted after procedure. IVF given with temporary relief of symptoms. Agree with midodrine .  Creatinine improved today.  Oral intake has improved some.  Overnight IV hydration improved renal function. IVF has now been stopped. Potassium 4.7 today.  No acute indication for dialysis at this time. Will continue to monitor renal indices.  Lab Results  Component Value Date   CREATININE 3.14 (H) 09/06/2023   CREATININE 3.46 (H) 09/05/2023   CREATININE 3.66 (H) 09/04/2023     Intake/Output Summary (Last 24 hours) at 09/06/2023 1058 Last data filed at 09/06/2023 0800 Gross per 24 hour  Intake 480 ml  Output 870 ml  Net -390 ml    2.  Anemia with acute blood loss, recent surgical procedure.  Hemoglobin 8.0 today.  Will continue to monitor for now and assess need for ESA.     LOS: 6 Victoria Clarke 5/11/202510:56 AM

## 2023-09-06 NOTE — Discharge Summary (Signed)
 Physician Discharge Summary  Patient ID: Victoria Clarke MRN: [id number] DOB/AGE: 07/10/1950 73 y.o.  Admit date: 08/31/2023 Discharge date: 09/06/2023  Admission Diagnoses: Left atrophic kidney  Discharge Diagnoses: Same  Discharged Condition: good  Hospital Course:  She underwent left laparoscopic hand-assisted nephrectomy for atrophic chronically infected left kidney.  There was purulent drainage from the kidney intraoperatively.  Postoperatively she had hypotension contributing to acute kidney injury that improved with antibiotics and fluid resuscitation.  Treatments: left hand assisted laparoscopic nephrectomy  Discharge Exam: Blood pressure 129/67, pulse 97, temperature 98.3 F (36.8 C), resp. rate 17, height 5\' 4"  (1.626 m), weight 81.3 kg, SpO2 100%.  Alert, NAD Abdomen soft, minimally tender, incision c/d/I Drain serous   Disposition: Discharge disposition: 01-Home or Self Care       Discharge Instructions     Discharge instructions   Complete by: As directed    You underwent removal of your kidney for infection.  Drink plenty of fluids to keep your remaining kidney flushed.  If you have fever over 101.3, severe nausea or vomiting, or uncontrolled pain please call the clinic or present to the ER.  No strenuous activity for at least 4 weeks.  No dietary restrictions.  You can shower and wash the incisions gently with soap and water , do not submerge the incisions for at least 3 weeks.   Discharge patient   Complete by: As directed    Discharge disposition: 01-Home or Self Care   Discharge patient date: 09/06/2023      Allergies as of 09/06/2023   No Known Allergies      Medication List     STOP taking these medications    tamsulosin  0.4 MG Caps capsule Commonly known as: FLOMAX        TAKE these medications    amLODipine -olmesartan  10-40 MG tablet Commonly known as: AZOR  TAKE 1 TABLET DAILY What changed: when to take this   atorvastatin  20 MG  tablet Commonly known as: LIPITOR TAKE 1 TABLET DAILY What changed: when to take this   ciprofloxacin  500 MG tablet Commonly known as: CIPRO  Take 0.5 tablets (250 mg total) by mouth 2 (two) times daily. What changed:  how much to take when to take this   docusate sodium  100 MG capsule Commonly known as: COLACE Take 1 capsule (100 mg total) by mouth daily.   ondansetron  4 MG disintegrating tablet Commonly known as: ZOFRAN -ODT Take 1 tablet (4 mg total) by mouth every 8 (eight) hours as needed for nausea or vomiting.   traMADol 50 MG tablet Commonly known as: Ultram Take 0.5-1 tablets (25-50 mg total) by mouth every 6 (six) hours as needed for up to 3 days for severe pain (pain score 7-10).          Signed: Lawerence Pressman 09/06/2023, 10:18 AM

## 2023-09-09 ENCOUNTER — Ambulatory Visit: Admitting: Urology

## 2023-09-18 ENCOUNTER — Other Ambulatory Visit: Payer: Self-pay | Admitting: Internal Medicine

## 2023-09-18 NOTE — Telephone Encounter (Signed)
 Requested Prescriptions  Pending Prescriptions Disp Refills   amLODipine -olmesartan  (AZOR ) 10-40 MG tablet [Pharmacy Med Name: AMLODIPINE /OLMESARTAN  TABS 10/40MG ] 90 tablet 0    Sig: TAKE 1 TABLET DAILY     Cardiovascular: CCB + ARB Combos Failed - 09/18/2023  6:00 PM      Failed - Cr in normal range and within 180 days    Creat  Date Value Ref Range Status  06/22/2023 1.13 (H) 0.60 - 1.00 mg/dL Final   Creatinine, Ser  Date Value Ref Range Status  09/06/2023 3.14 (H) 0.44 - 1.00 mg/dL Final         Passed - K in normal range and within 180 days    Potassium  Date Value Ref Range Status  09/06/2023 4.7 3.5 - 5.1 mmol/L Final         Passed - Na in normal range and within 180 days    Sodium  Date Value Ref Range Status  09/06/2023 138 135 - 145 mmol/L Final         Passed - Patient is not pregnant      Passed - Last BP in normal range    BP Readings from Last 1 Encounters:  09/06/23 129/67         Passed - Valid encounter within last 6 months    Recent Outpatient Visits           1 month ago Sepsis due to Escherichia coli with acute renal failure and septic shock, unspecified acute renal failure type [facility])   [facility] Gamaliel, Rankin Buzzard, NP   2 months ago Toenail deformity   [facility] [place], Rankin Buzzard, [place]

## 2023-10-01 ENCOUNTER — Ambulatory Visit (INDEPENDENT_AMBULATORY_CARE_PROVIDER_SITE_OTHER): Admitting: Urology

## 2023-10-01 VITALS — BP 107/65 | HR 119 | Ht 63.0 in | Wt 153.4 lb

## 2023-10-01 DIAGNOSIS — Z9889 Other specified postprocedural states: Secondary | ICD-10-CM

## 2023-10-01 DIAGNOSIS — Z905 Acquired absence of kidney: Secondary | ICD-10-CM

## 2023-10-01 DIAGNOSIS — N1330 Unspecified hydronephrosis: Secondary | ICD-10-CM

## 2023-10-01 NOTE — Progress Notes (Signed)
 Victoria Clarke,acting as a scribe for Victoria Gimenez, MD.,have documented all relevant documentation on the behalf of Victoria Gimenez, MD,as directed by  Victoria Gimenez, MD while in the presence of Victoria Gimenez, MD.  10/01/23 11:24 AM   Victoria Clarke Sep 13, 1950 [id number]  Referring provider: Carollynn Cirri, NP [address] [place],  [place] [address]  Chief Complaint  Patient presents with   Follow-up    HPI: 73 year old female with a personal history of a chronically infected purulent left kidney, which was severely atrophic, presents today for follow-up. She underwent a left-handed assist nephrectomy on 08/31/2023. The post-operative course was complicated by an episode of hypotension and acute kidney injury, which was improving prior to discharge.   Her baseline creatinine was normal prior to her treatment. The surgical pathology revealed multiple renal calculi, hydronephrosis with extensive cortical atrophy, acute and chronic inflammation, and a benign adrenal gland with no evidence of malignancy.   She reports feeling better each week, with improved appetite and resolution of nausea. She is ambulating more, though still feels a bit wobbly. She reports increased urine output and plans to monitor salt and sugar intake.   She inquires about receiving a COVID booster, which is encouraged.  PMH: Past Medical History:  Diagnosis Date   Colon polyp    Former smoker    High cholesterol    Hydronephrosis of left kidney    Hypertension    Left renal atrophy    Normocytic anemia    Obesity    Recurrent UTI (urinary tract infection)    Sepsis (HCC)    Ureteral obstruction, left     Surgical History: Past Surgical History:  Procedure Laterality Date   BREAST BIOPSY Left 04-26-14   ductal hyperplasia, 1 mm papilloma.   BREAST SURGERY Right 10/25/2009   milk duct excision in [place] , atypical duct epithelial proliferation l   CESAREAN SECTION  1981   CHOLECYSTECTOMY  2006    COLONOSCOPY  2016   Dr Felicita Horns   COLONOSCOPY WITH PROPOFOL  N/A 07/30/2022   Procedure: COLONOSCOPY WITH PROPOFOL ;  Surgeon: Luke Salaam, MD;  Location[facility] ENDOSCOPY;  Service: Gastroenterology;  Laterality: N/A;   CYSTOSCOPY W/ URETERAL STENT PLACEMENT Left 07/22/2023   Procedure: CYSTOSCOPY, WITH RETROGRADE PYELOGRAM AND URETERAL STENT INSERTION;  Surgeon: Victoria Gimenez, MD;  Location: [facility] ORS;  Service: Urology;  Laterality: Left;   CYSTOSCOPY W/ URETERAL STENT REMOVAL Left 08/31/2023   Procedure: REMOVAL, STENT, URETER;  Surgeon: Victoria Gimenez, MD;  Location: [facility] ORS;  Service: Urology;  Laterality: Left;   EXCISION / BIOPSY BREAST / NIPPLE / DUCT Right 2011   duct excision   LAPAROSCOPIC NEPHRECTOMY, HAND ASSISTED Left 08/31/2023   Procedure: NEPHRECTOMY, HAND-ASSISTED, LAPAROSCOPIC;  Surgeon: Victoria Gimenez, MD;  Location: [facility] ORS;  Service: Urology;  Laterality: Left;    Home Medications:  Allergies as of 10/01/2023   No Known Allergies      Medication List        Accurate as of October 01, 2023 11:24 AM. If you have any questions, ask your nurse or doctor.          STOP taking these medications    ciprofloxacin  500 MG tablet Commonly known as: CIPRO    docusate sodium  100 MG capsule Commonly known as: COLACE       TAKE these medications    amLODipine -olmesartan  10-40 MG tablet Commonly known as: AZOR  TAKE 1 TABLET DAILY   atorvastatin  20 MG tablet Commonly known as: LIPITOR  TAKE 1 TABLET DAILY What changed: when to take this   ondansetron  4 MG disintegrating tablet Commonly known as: ZOFRAN -ODT Take 1 tablet (4 mg total) by mouth every 8 (eight) hours as needed for nausea or vomiting.        Family History: Family History  Problem Relation Age of Onset   Breast cancer Mother 2       Low grade DCIS   Dementia Mother    Dementia Father    Colon polyps Father    Breast cancer Sister 30   Healthy Sister    Cancer Maternal Aunt        breast/great  Aunt    Social History:  reports that she quit smoking about 24 years ago. Her smoking use included cigarettes. She started smoking about 54 years ago. She has a 30 pack-year smoking history. She has never used smokeless tobacco. She reports that she does not drink alcohol and does not use drugs.   Physical Exam: BP 107/65   Pulse (!) 119   Ht 5\' 3"  (1.6 m)   Wt 153 lb 6 oz (69.6 kg)   BMI 27.17 kg/m   Constitutional:  Alert and oriented, No acute distress. HEENT: [place] AT, moist mucus membranes.  Trachea midline, no masses. Abdomen: Incisions are well healed, no hernias Neurologic: Grossly intact, no focal deficits, moving all 4 extremities. Psychiatric: Normal mood and affect.   Pertinent Imaging: EXAM: RENAL / URINARY TRACT ULTRASOUND COMPLETE  COMPARISON:  08/15/2023  FINDINGS: Right Kidney:  Renal measurements: 14.3 x 5.9 x 6.9 cm. = volume: 305 mL. Echogenicity within normal limits. No mass or hydronephrosis visualized. 1.4 cm cyst is noted similar to that seen on prior CT examination. No follow-up is recommended.  Left Kidney:  Surgically removed 2 days previous  Bladder:  Bladder is well distended. Some dependent echogenicity is noted which may be related to debris. Correlate with urinalysis. No other focal abnormality is noted.  Other:  None.  IMPRESSION: Status post left nephrectomy.  No acute abnormality noted.   Electronically Signed By: Violeta Grey M.D. On: 09/02/2023 19:38  This was personally reviewed and I agree with the radiologic interpretation.  Assessment & Plan:    1. Acute on Chronic Kidney Disease - She experienced acute kidney injury post-operatively, which is improving.  - A BMP will be obtained today to assess current kidney function. If kidney function has not normalized, a referral to a nephrologist will be considered for further management and counseling on kidney health and dietary modifications.  - She is advised to stay  hydrated and avoid NSAIDs such as ibuprofen, Motrin, Aleve, and Avibra to protect kidney function.  2. Post-operative Recovery - She is recovering well post-nephrectomy, with improved strength and activity levels. The incision is healing well.  - She is advised to continue increasing activity as tolerated and is cleared for lifting, as the risk of hernia is low at this point.  3. Kidney Stone Prevention - She is advised to add citrus to the diet to help prevent stone formation.  - Generic guidelines for stone prevention will be provided in the after-visit summary. The stones were likely infection-related, making prevention challenging beyond maintaining hydration.  Return for review of lab results. A nephrology referral will be made if necessary .  I have reviewed the above documentation for accuracy and completeness, and I agree with the above.   Victoria Gimenez, MD    [facility] [phone]  [place], [place] [address] [phone]

## 2023-10-02 ENCOUNTER — Ambulatory Visit: Admitting: Urology

## 2023-10-02 LAB — BASIC METABOLIC PANEL WITH GFR
BUN/Creatinine Ratio: 10 — ABNORMAL LOW (ref 12–28)
BUN: 15 mg/dL (ref 8–27)
CO2: 21 mmol/L (ref 20–29)
Calcium: 10 mg/dL (ref 8.7–10.3)
Chloride: 104 mmol/L (ref 96–106)
Creatinine, Ser: 1.49 mg/dL — ABNORMAL HIGH (ref 0.57–1.00)
Glucose: 89 mg/dL (ref 70–99)
Potassium: 5.7 mmol/L — ABNORMAL HIGH (ref 3.5–5.2)
Sodium: 142 mmol/L (ref 134–144)
eGFR: 37 mL/min/{1.73_m2} — ABNORMAL LOW (ref >59)

## 2023-10-06 ENCOUNTER — Encounter: Payer: Self-pay | Admitting: Urology

## 2023-10-06 ENCOUNTER — Ambulatory Visit: Payer: Self-pay | Admitting: Urology

## 2023-10-06 DIAGNOSIS — N289 Disorder of kidney and ureter, unspecified: Secondary | ICD-10-CM

## 2023-10-08 ENCOUNTER — Other Ambulatory Visit

## 2023-10-08 DIAGNOSIS — N289 Disorder of kidney and ureter, unspecified: Secondary | ICD-10-CM

## 2023-10-09 LAB — BASIC METABOLIC PANEL WITH GFR
BUN/Creatinine Ratio: 14 (ref 12–28)
BUN: 24 mg/dL (ref 8–27)
CO2: 20 mmol/L (ref 20–29)
Calcium: 9.9 mg/dL (ref 8.7–10.3)
Chloride: 100 mmol/L (ref 96–106)
Creatinine, Ser: 1.67 mg/dL — ABNORMAL HIGH (ref 0.57–1.00)
Glucose: 88 mg/dL (ref 70–99)
Potassium: 4.9 mmol/L (ref 3.5–5.2)
Sodium: 139 mmol/L (ref 134–144)
eGFR: 32 mL/min/{1.73_m2} — ABNORMAL LOW (ref >59)

## 2023-10-13 ENCOUNTER — Ambulatory Visit
Admission: RE | Admit: 2023-10-13 | Discharge: 2023-10-13 | Disposition: A | Discharge status: Home / Self Care | Source: Ambulatory Visit | Attending: Internal Medicine | Admitting: Internal Medicine

## 2023-10-13 DIAGNOSIS — Z1231 Encounter for screening mammogram for malignant neoplasm of breast: Secondary | ICD-10-CM | POA: Diagnosis present

## 2023-10-23 ENCOUNTER — Ambulatory Visit: Payer: Self-pay | Admitting: Urology

## 2023-11-19 ENCOUNTER — Inpatient Hospital Stay: Attending: Internal Medicine | Admitting: Internal Medicine

## 2023-11-19 ENCOUNTER — Encounter: Payer: Self-pay | Admitting: Internal Medicine

## 2023-11-19 ENCOUNTER — Inpatient Hospital Stay

## 2023-11-19 VITALS — BP 112/61 | HR 83 | Temp 97.9°F | Resp 16 | Ht 63.0 in | Wt 157.0 lb

## 2023-11-19 DIAGNOSIS — Z79899 Other long term (current) drug therapy: Secondary | ICD-10-CM | POA: Diagnosis not present

## 2023-11-19 DIAGNOSIS — D649 Anemia, unspecified: Secondary | ICD-10-CM | POA: Diagnosis not present

## 2023-11-19 DIAGNOSIS — Z83719 Family history of colon polyps, unspecified: Secondary | ICD-10-CM | POA: Diagnosis not present

## 2023-11-19 DIAGNOSIS — Z87891 Personal history of nicotine dependence: Secondary | ICD-10-CM | POA: Diagnosis not present

## 2023-11-19 DIAGNOSIS — Z803 Family history of malignant neoplasm of breast: Secondary | ICD-10-CM | POA: Insufficient documentation

## 2023-11-19 DIAGNOSIS — N183 Chronic kidney disease, stage 3 unspecified: Secondary | ICD-10-CM | POA: Insufficient documentation

## 2023-11-19 NOTE — Progress Notes (Signed)
 [facility] CONSULT NOTE  Patient Care Team: Antonette Angeline ORN, NP as PCP - General (Internal Medicine) Elenor Raisin, NP as Nurse Practitioner (Family Medicine) Dessa, Reyes ORN, MD (General Surgery) Rennie Cindy SAUNDERS, MD as Consulting Physician (Oncology)  CHIEF COMPLAINTS/PURPOSE OF CONSULTATION: ANEMIA   HEMATOLOGY HISTORY  # ANEMIA[Hb; MCV-platelets- WBC; Iron sat; ferritin;  GFR- CT/US ; EGD/colonoscopy-  # CKD- [Dr.]  HISTORY OF PRESENTING ILLNESS:  Victoria Clarke 73 y.o.  female pleasant patient was been referred to us  for further evaluation of anemia.  Patient was recently admitted to hospital in May for acute renal failure-status post status post nephrectomy [kidney stone/recurrent infections].  Patient noted to have hemoglobin nadir 78 in the hospital.  Her GFR was as low as 15 in the hospital.  Patient recently evaluated by nephrology-noted to have improvement of her renal function and also anemia.  Patient denies any blood in stools or black-colored stools.  Denies any nausea or vomiting.  Patient is not on oral iron.   Review of Systems  Constitutional:  Positive for malaise/fatigue. Negative for chills, diaphoresis, fever and weight loss.  HENT:  Negative for nosebleeds and sore throat.   Eyes:  Negative for double vision.  Respiratory:  Negative for cough, hemoptysis, sputum production, shortness of breath and wheezing.   Cardiovascular:  Negative for chest pain, palpitations, orthopnea and leg swelling.  Gastrointestinal:  Negative for abdominal pain, blood in stool, constipation, diarrhea, heartburn, melena, nausea and vomiting.  Genitourinary:  Negative for dysuria, frequency and urgency.  Musculoskeletal:  Negative for back pain and joint pain.  Skin: Negative.  Negative for itching and rash.  Neurological:  Negative for dizziness, tingling, focal weakness, weakness and headaches.  Endo/Heme/Allergies:  Does not bruise/bleed easily.   Psychiatric/Behavioral:  Negative for depression. The patient is not nervous/anxious and does not have insomnia.      MEDICAL HISTORY:  Past Medical History:  Diagnosis Date   Colon polyp    Former smoker    High cholesterol    Hydronephrosis of left kidney    Hypertension    Left renal atrophy    Normocytic anemia    Obesity    Recurrent UTI (urinary tract infection)    Sepsis (HCC)    Ureteral obstruction, left     SURGICAL HISTORY: Past Surgical History:  Procedure Laterality Date   BREAST BIOPSY Left 04-26-14   ductal hyperplasia, 1 mm papilloma.   BREAST SURGERY Right 10/25/2009   milk duct excision in [place] , atypical duct epithelial proliferation l   CESAREAN SECTION  1981   CHOLECYSTECTOMY  2006   COLONOSCOPY  2016   Dr Viktoria   COLONOSCOPY WITH PROPOFOL  N/A 07/30/2022   Procedure: COLONOSCOPY WITH PROPOFOL ;  Surgeon: Therisa Bi, MD;  Location: [facility] ENDOSCOPY;  Service: Gastroenterology;  Laterality: N/A;   CYSTOSCOPY W/ URETERAL STENT PLACEMENT Left 07/22/2023   Procedure: CYSTOSCOPY, WITH RETROGRADE PYELOGRAM AND URETERAL STENT INSERTION;  Surgeon: Penne Knee, MD;  Location: [facility] ORS;  Service: Urology;  Laterality: Left;   CYSTOSCOPY W/ URETERAL STENT REMOVAL Left 08/31/2023   Procedure: REMOVAL, STENT, URETER;  Surgeon: Penne Knee, MD;  Location: [facility] ORS;  Service: Urology;  Laterality: Left;   EXCISION / BIOPSY BREAST / NIPPLE / DUCT Right 2011   duct excision   LAPAROSCOPIC NEPHRECTOMY, HAND ASSISTED Left 08/31/2023   Procedure: NEPHRECTOMY, HAND-ASSISTED, LAPAROSCOPIC;  Surgeon: Penne Knee, MD;  Location: [facility] ORS;  Service: Urology;  Laterality: Left;    SOCIAL HISTORY: Social History  Socioeconomic History   Marital status: Married    Spouse name: Not on file   Number of children: Not on file   Years of education: Not on file   Highest education level: Not on file  Occupational History   Not on file  Tobacco Use   Smoking status:  Former    Current packs/day: 0.00    Average packs/day: 1 pack/day for 30.0 years (30.0 ttl pk-yrs)    Types: Cigarettes    Start date: 04/28/1969    Quit date: 04/29/1999    Years since quitting: 24.5   Smokeless tobacco: Never  Vaping Use   Vaping status: Never Used  Substance and Sexual Activity   Alcohol use: No   Drug use: No   Sexual activity: Not Currently    Birth control/protection: Diaphragm, None  Other Topics Concern   Not on file  Social History Narrative   Not on file   Social Drivers of Health   Financial Resource Strain: Low Risk  (12/04/2022)   Overall Financial Resource Strain (CARDIA)    Difficulty of Paying Living Expenses: Not hard at all  Food Insecurity: No Food Insecurity (11/19/2023)   Hunger Vital Sign    Worried About Running Out of Food in the Last Year: Never true    Ran Out of Food in the Last Year: Never true  Transportation Needs: No Transportation Needs (11/19/2023)   PRAPARE - Administrator, Civil Service (Medical): No    Lack of Transportation (Non-Medical): No  Physical Activity: Insufficiently Active (12/04/2022)   Exercise Vital Sign    Days of Exercise per Week: 3 days    Minutes of Exercise per Session: 30 min  Stress: No Stress Concern Present (12/04/2022)   Victoria Clarke [employer] Occupational [employer] - Occupational Stress Questionnaire    Feeling of Stress : Only a little  Social Connections: Unknown (08/31/2023)   Social Connection and Isolation Panel    Frequency of Communication with Friends and Family: More than three times a week    Frequency of Social Gatherings with Friends and Family: More than three times a week    Attends Religious Services: Patient declined    Database administrator or Organizations: Patient declined    Attends Banker Meetings: Patient declined    Marital Status: Patient declined  Recent Concern: Social Connections - Moderately Isolated (07/23/2023)   Social Connection and Isolation Panel     Frequency of Communication with Friends and Family: More than three times a week    Frequency of Social Gatherings with Friends and Family: Three times a week    Attends Religious Services: Never    Active Member of Clubs or Organizations: No    Attends Banker Meetings: Never    Marital Status: Married  Catering manager Violence: Not At Risk (11/19/2023)   Humiliation, Afraid, Rape, and Kick questionnaire    Fear of Current or Ex-Partner: No    Emotionally Abused: No    Physically Abused: No    Sexually Abused: No    FAMILY HISTORY: Family History  Problem Relation Age of Onset   Breast cancer Mother 56       Low grade DCIS   Dementia Mother    Dementia Father    Colon polyps Father    Breast cancer Sister 59   Healthy Sister    Cancer Maternal Aunt        breast/great Aunt    ALLERGIES:  has no  known allergies.  MEDICATIONS:  Current Outpatient Medications  Medication Sig Dispense Refill   amLODipine -olmesartan  (AZOR ) 10-40 MG tablet TAKE 1 TABLET DAILY 90 tablet 0   atorvastatin  (LIPITOR) 20 MG tablet TAKE 1 TABLET DAILY 90 tablet 2   No current facility-administered medications for this visit.     SABRA  PHYSICAL EXAMINATION:   Vitals:   11/19/23 1049  BP: 112/61  Pulse: 83  Resp: 16  Temp: 97.9 F (36.6 C)  SpO2: 100%   Filed Weights   11/19/23 1049  Weight: 157 lb (71.2 kg)    Physical Exam Vitals and nursing note reviewed.  HENT:     Head: Normocephalic and atraumatic.     Mouth/Throat:     Pharynx: Oropharynx is clear.  Eyes:     Extraocular Movements: Extraocular movements intact.     Pupils: Pupils are equal, round, and reactive to light.  Cardiovascular:     Rate and Rhythm: Normal rate and regular rhythm.  Pulmonary:     Comments: Decreased breath sounds bilaterally.  Abdominal:     Palpations: Abdomen is soft.  Musculoskeletal:        General: Normal range of motion.     Cervical back: Normal range of motion.  Skin:     General: Skin is warm.  Neurological:     General: No focal deficit present.     Mental Status: She is alert and oriented to person, place, and time.  Psychiatric:        Behavior: Behavior normal.        Judgment: Judgment normal.      LABORATORY DATA:  I have reviewed the data as listed Lab Results  Component Value Date   WBC 7.2 09/06/2023   HGB 8.0 (L) 09/06/2023   HCT 26.1 (L) 09/06/2023   MCV 87.6 09/06/2023   PLT 287 09/06/2023   Recent Labs    07/22/23 1004 07/23/23 0522 08/15/23 1652 09/01/23 0459 09/01/23 2221 09/02/23 0414 09/04/23 0530 09/05/23 0419 09/06/23 0426 10/01/23 0846 10/08/23 1041  NA 132*   < > 132*   < > 134*   < > 138 140 138 142 139  K 4.3   < > 4.6   < > 5.0   < > 4.6 4.8 4.7 5.7* 4.9  CL 96*   < > 96*   < > 106   < > 107 112* 111 104 100  CO2 24   < > 26   < > 20*   < > 20* 22 20* 21 20  GLUCOSE 100*   < > 140*   < > 109*   < > 96 96 96 89 88  BUN 36*   < > 18   < > 31*   < > 49* 52* 47* 15 24  CREATININE 1.91*   < > 1.04*   < > 3.36*   < > 3.66* 3.46* 3.14* 1.49* 1.67*  CALCIUM  8.9   < > 9.5   < > 7.8*   < > 8.3* 8.3* 8.2* 10.0 9.9  GFRNONAA 27*   < > 57*   < > 14*   < > 13* 13* 15*  --   --   PROT 7.7  --  7.7  --  5.6*  --   --   --   --   --   --   ALBUMIN  2.8*  --  3.2*  --  2.0*  --   --   --   --   --   --  AST 25  --  17  --  18  --   --   --   --   --   --   ALT 18  --  14  --  5  --   --   --   --   --   --   ALKPHOS 68  --  71  --  49  --   --   --   --   --   --   BILITOT 0.8  --  1.3*  --  0.4  --   --   --   --   --   --   BILIDIR  --   --  0.2  --   --   --   --   --   --   --   --   IBILI  --   --  1.1*  --   --   --   --   --   --   --   --    < > = values in this interval not displayed.     No results found.  ASSESSMENT & PLAN:   Symptomatic anemia # Chronic anemia-normocytic -mild symptomatic hemoglobin-around 10-June 25 [improved from 8 in May] no iron studies the etiology is likely chronic renal  disease.  #Recommend gentle iron [iron biglycinate; 28 mg ] 1 pill a day.  This pill is unlikely to cause stomach upset or cause constipation.  Available Over the counter or talk to pharmacist.  Consider iron infusion if hemoglobin trend down/intolerance to oral iron.  # In about 3 months I would I recommend CBC CMP LDH peripheral smear; haptoglobin; erythropoietin; iron studies ferritin B12 folic acid; reticulocyte count;   multiple myeloma panel.  Kappa lambda light chain ratio. HOLD of bone marrow Biopsy at this time-  # Discussed at length the pathophysiology of anemia from chronic kidney disease which includes decreased erythropoietin production; and decreased iron stores in the body.  I discussed that I would recommend using iron infusion/Venofer; along with Retacrit to maintain hemoglobin around 11.  I would recommend the goal hemoglobin less than 11 as there is a increased risk of thromboembolic events in the target hemoglobin is around 12 to 13.     # CKD-stage III [status post left nephrectomy-renal stone]-continue follow-up with Dr.lateef  Thank you Dr. Lateef  for allowing me to participate in the care of your pleasant patient. Please do not hesitate to contact me with questions or concerns in the interim.  # DISPOSITION: # labs today-NONE # follow up  3 months-MD-possible venofer or retacrit- 2 week prior- labs- CBC CMP LDH peripheral smear; haptoglobin; erythropoietin; iron studies ferritin B12 folic acid; reticulocyte count;  multiple myeloma panel.  Kappa lambda light chain ratio.; - Dr.B    All questions were answered. The patient knows to call the clinic with any problems, questions or concerns.    Cindy JONELLE Joe, MD 11/19/2023 12:13 PM

## 2023-11-19 NOTE — Progress Notes (Signed)
 Fatigue/weakness: NO Dyspena: NO  Light headedness: YES Blood in stool: NO  Pt would like to discuss the option of being on an oral iron instead of iv treatment.

## 2023-11-19 NOTE — Assessment & Plan Note (Addendum)
#   Chronic anemia-normocytic -mild symptomatic hemoglobin-around 10-June 25 [improved from 8 in May] no iron studies the etiology is likely chronic renal disease.  #Recommend gentle iron [iron biglycinate; 28 mg ] 1 pill a day.  This pill is unlikely to cause stomach upset or cause constipation.  Available Over the counter or talk to pharmacist.  Consider iron infusion if hemoglobin trend down/intolerance to oral iron.  # In about 3 months I would I recommend CBC CMP LDH peripheral smear; haptoglobin; erythropoietin; iron studies ferritin B12 folic acid; reticulocyte count;   multiple myeloma panel.  Kappa lambda light chain ratio. HOLD of bone marrow Biopsy at this time-  # Discussed at length the pathophysiology of anemia from chronic kidney disease which includes decreased erythropoietin production; and decreased iron stores in the body.  I discussed that I would recommend using iron infusion/Venofer; along with Retacrit to maintain hemoglobin around 11.  I would recommend the goal hemoglobin less than 11 as there is a increased risk of thromboembolic events in the target hemoglobin is around 12 to 13.     # CKD-stage III [status post left nephrectomy-renal stone]-continue follow-up with Dr.lateef  Thank you Dr. Lateef  for allowing me to participate in the care of your pleasant patient. Please do not hesitate to contact me with questions or concerns in the interim.  # DISPOSITION: # labs today-NONE # follow up  3 months-MD-possible venofer or retacrit- 2 week prior- labs- CBC CMP LDH peripheral smear; haptoglobin; erythropoietin; iron studies ferritin B12 folic acid; reticulocyte count;  multiple myeloma panel.  Kappa lambda light chain ratio.; - Dr.B

## 2023-11-19 NOTE — Patient Instructions (Signed)
#  Recommend gentle iron [iron biglycinate; 28 mg ] 1 pill a day.  This pill is unlikely to cause stomach upset or cause constipation. Available Over the counter or talk to pharmacist.

## 2023-12-10 ENCOUNTER — Ambulatory Visit: Payer: Medicare Other

## 2023-12-10 VITALS — Ht 63.0 in | Wt 157.0 lb

## 2023-12-10 DIAGNOSIS — Z0000 Encounter for general adult medical examination without abnormal findings: Secondary | ICD-10-CM

## 2023-12-10 NOTE — Progress Notes (Signed)
 Subjective:   Victoria Clarke is a 73 y.o. who presents for a Medicare Wellness preventive visit.  As a reminder, Annual Wellness Visits don't include a physical exam, and some assessments may be limited, especially if this visit is performed virtually. We may recommend an in-person follow-up visit with your provider if needed.  Visit Complete: Virtual I connected with  Devere Rinks on 12/10/23 by a audio enabled telemedicine application and verified that I am speaking with the correct person using two identifiers.  Patient Location: Home  Provider Location: Home Office  I discussed the limitations of evaluation and management by telemedicine. The patient expressed understanding and agreed to proceed.  Vital Signs: Because this visit was a virtual/telehealth visit, some criteria may be missing or patient reported. Any vitals not documented were not able to be obtained and vitals that have been documented are patient reported.    Persons Participating in Visit: Patient.  AWV Questionnaire: No: Patient Medicare AWV questionnaire was not completed prior to this visit.  Cardiac Risk Factors include: advanced age (>76men, >23 women);hypertension;dyslipidemia     Objective:    Today's Vitals   12/10/23 0940 12/10/23 0941  Weight: 157 lb (71.2 kg)   Height: 5' 3 (1.6 m)   PainSc:  0-No pain   Body mass index is 27.81 kg/m.     12/10/2023    9:46 AM 11/19/2023   10:49 AM 09/06/2023   12:18 PM 09/06/2023   10:31 AM 08/31/2023    1:19 PM 08/24/2023   12:51 PM 08/15/2023    4:51 PM  Advanced Directives  Does Patient Have a Medical Advance Directive? No No No No No No No  Would patient like information on creating a medical advance directive? No - Patient declined No - Patient declined No - Patient declined No - Patient declined No - Patient declined  No - Patient declined    Current Medications (verified) Outpatient Encounter Medications as of 12/10/2023  Medication Sig    amLODipine -olmesartan  (AZOR ) 10-40 MG tablet TAKE 1 TABLET DAILY   atorvastatin  (LIPITOR) 20 MG tablet TAKE 1 TABLET DAILY   No facility-administered encounter medications on file as of 12/10/2023.    Allergies (verified) Patient has no known allergies.   History: Past Medical History:  Diagnosis Date   Colon polyp    Former smoker    High cholesterol    Hydronephrosis of left kidney    Hypertension    Left renal atrophy    Normocytic anemia    Obesity    Recurrent UTI (urinary tract infection)    Sepsis (HCC)    Ureteral obstruction, left    Past Surgical History:  Procedure Laterality Date   BREAST BIOPSY Left 04-26-14   ductal hyperplasia, 1 mm papilloma.   BREAST SURGERY Right 10/25/2009   milk duct excision in [place] , atypical duct epithelial proliferation l   CESAREAN SECTION  1981   CHOLECYSTECTOMY  2006   COLONOSCOPY  2016   Dr Viktoria   COLONOSCOPY WITH PROPOFOL  N/A 07/30/2022   Procedure: COLONOSCOPY WITH PROPOFOL ;  Surgeon: Therisa Bi, MD;  Location: [facility] ENDOSCOPY;  Service: Gastroenterology;  Laterality: N/A;   CYSTOSCOPY W/ URETERAL STENT PLACEMENT Left 07/22/2023   Procedure: CYSTOSCOPY, WITH RETROGRADE PYELOGRAM AND URETERAL STENT INSERTION;  Surgeon: Penne Knee, MD;  Location: [facility] ORS;  Service: Urology;  Laterality: Left;   CYSTOSCOPY W/ URETERAL STENT REMOVAL Left 08/31/2023   Procedure: REMOVAL, STENT, URETER;  Surgeon: Penne Knee, MD;  Location: [facility] ORS;  Service:  Urology;  Laterality: Left;   EXCISION / BIOPSY BREAST / NIPPLE / DUCT Right 2011   duct excision   LAPAROSCOPIC NEPHRECTOMY, HAND ASSISTED Left 08/31/2023   Procedure: NEPHRECTOMY, HAND-ASSISTED, LAPAROSCOPIC;  Surgeon: Penne Knee, MD;  Location: [facility] ORS;  Service: Urology;  Laterality: Left;   Family History  Problem Relation Age of Onset   Breast cancer Mother 58       Low grade DCIS   Dementia Mother    Dementia Father    Colon polyps Father    Breast cancer Sister 22    Healthy Sister    Cancer Maternal Aunt        breast/great Aunt   Social History   Socioeconomic History   Marital status: Married    Spouse name: Not on file   Number of children: Not on file   Years of education: Not on file   Highest education level: Some college, no degree  Occupational History   Not on file  Tobacco Use   Smoking status: Former    Current packs/day: 0.00    Average packs/day: 1 pack/day for 30.0 years (30.0 ttl pk-yrs)    Types: Cigarettes    Start date: 04/28/1969    Quit date: 04/29/1999    Years since quitting: 24.6   Smokeless tobacco: Never  Vaping Use   Vaping status: Never Used  Substance and Sexual Activity   Alcohol use: No   Drug use: No   Sexual activity: Not Currently    Birth control/protection: Diaphragm, None  Other Topics Concern   Not on file  Social History Narrative   Not on file   Social Drivers of Health   Financial Resource Strain: Low Risk  (12/10/2023)   Overall Financial Resource Strain (CARDIA)    Difficulty of Paying Living Expenses: Not hard at all  Food Insecurity: No Food Insecurity (12/10/2023)   Hunger Vital Sign    Worried About Running Out of Food in the Last Year: Never true    Ran Out of Food in the Last Year: Never true  Transportation Needs: No Transportation Needs (12/10/2023)   PRAPARE - Administrator, Civil Service (Medical): No    Lack of Transportation (Non-Medical): No  Physical Activity: Insufficiently Active (12/10/2023)   Exercise Vital Sign    Days of Exercise per Week: 7 days    Minutes of Exercise per Session: 20 min  Stress: No Stress Concern Present (12/10/2023)   [employer] of Occupational Health - Occupational Stress Questionnaire    Feeling of Stress: Not at all  Social Connections: Moderately Isolated (12/10/2023)   Social Connection and Isolation Panel    Frequency of Communication with Friends and Family: More than three times a week    Frequency of Social Gatherings  with Friends and Family: More than three times a week    Attends Religious Services: Never    Database administrator or Organizations: No    Attends Engineer, structural: Never    Marital Status: Married    Tobacco Counseling Counseling given: Not Answered    Clinical Intake:  Pre-visit preparation completed: Yes  Pain : No/denies pain Pain Score: 0-No pain     BMI - recorded: 27.81 Nutritional Status: BMI 25 -29 Overweight Nutritional Risks: None Diabetes: No  Lab Results  Component Value Date   HGBA1C 5.4 06/22/2023   HGBA1C 5.4 06/24/2022     How often do you need to have someone help you when you  read instructions, pamphlets, or other written materials from your doctor or pharmacy?: 1 - Never  Interpreter Needed?: No  Information entered by :: Rojelio Blush LPN   Activities of Daily Living     12/10/2023    9:45 AM 09/06/2023   10:29 AM  In your present state of health, do you have any difficulty performing the following activities:  Hearing? 0   Vision? 0   Difficulty concentrating or making decisions? 0   Walking or climbing stairs? 0   Dressing or bathing? 0   Doing errands, shopping? 0 0  Preparing Food and eating ? N   Using the Toilet? N   In the past six months, have you accidently leaked urine? N   Do you have problems with loss of bowel control? N   Managing your Medications? N   Managing your Finances? N   Housekeeping or managing your Housekeeping? N     Patient Care Team: Antonette Angeline ORN, NP as PCP - General (Internal Medicine) Elenor Raisin, NP as Nurse Practitioner (Family Medicine) Dessa, Reyes ORN, MD (General Surgery) Rennie Cindy SAUNDERS, MD as Consulting Physician (Oncology)  I have updated your Care Teams any recent Medical Services you may have received from other providers in the past year.     Assessment:   This is a routine wellness examination for Sincerity.  Hearing/Vision screen Hearing Screening -  Comments:: Denies hearing difficulties   Vision Screening - Comments:: Wears rx glasses - up to date with routine eye exams with  [facility]   Goals Addressed               This Visit's Progress     Increase physical activity (pt-stated)        Remain active.       Depression Screen     12/10/2023    9:43 AM 11/19/2023   10:59 AM 11/19/2023   10:55 AM 07/27/2023   11:03 AM 06/22/2023    8:27 AM 12/18/2022    8:15 AM 12/04/2022    9:59 AM  PHQ 2/9 Scores  PHQ - 2 Score 0 0 0 0 0 0 0  PHQ- 9 Score    0  0 0    Fall Risk     12/10/2023    9:45 AM 07/27/2023   11:03 AM 06/22/2023    8:27 AM 12/18/2022    8:15 AM 12/04/2022   10:01 AM  Fall Risk   Falls in the past year? 0 1 0 0 0  Number falls in past yr: 0 0   0  Injury with Fall? 0 0  0 0  Risk for fall due to : No Fall Risks   No Fall Risks No Fall Risks  Follow up Falls evaluation completed    Falls prevention discussed;Falls evaluation completed    MEDICARE RISK AT HOME:  Medicare Risk at Home Any stairs in or around the home?: No If so, are there any without handrails?: No Home free of loose throw rugs in walkways, pet beds, electrical cords, etc?: Yes Adequate lighting in your home to reduce risk of falls?: Yes Life alert?: No Use of a cane, walker or w/c?: No Grab bars in the bathroom?: Yes Shower chair or bench in shower?: Yes Elevated toilet seat or a handicapped toilet?: No  TIMED UP AND GO:  Was the test performed?  No  Cognitive Function: 6CIT completed        12/10/2023    9:46  AM 12/04/2022   10:08 AM  6CIT Screen  What Year? 0 points 0 points  What month? 0 points 0 points  What time? 0 points 0 points  Count back from 20 0 points 0 points  Months in reverse 0 points 0 points  Repeat phrase 0 points 0 points  Total Score 0 points 0 points    Immunizations Immunization History  Administered Date(s) Administered   Influenza-Unspecified 01/28/2023   PFIZER(Purple Top)SARS-COV-2  Vaccination 06/18/2019, 07/12/2019   PNEUMOCOCCAL CONJUGATE-20 07/11/2022   Pfizer Fall 2024 Covid-19 Vaccine 6mos thru 20yrs 01/14/2022   Unspecified SARS-COV-2 Vaccination 01/28/2023   Zoster Recombinant(Shingrix) 07/10/2022, 10/21/2022    Screening Tests Health Maintenance  Topic Date Due   DTaP/Tdap/Td (1 - Tdap) Never done   COVID-19 Vaccine (4 - 2024-25 season) 07/29/2023   INFLUENZA VACCINE  11/27/2023   MAMMOGRAM  10/12/2024   Medicare Annual Wellness (AWV)  12/09/2024   Colonoscopy  07/29/2032   Pneumococcal Vaccine: 50+ Years  Completed   DEXA SCAN  Completed   Hepatitis C Screening  Completed   Zoster Vaccines- Shingrix  Completed   HPV VACCINES  Aged Out   Meningococcal B Vaccine  Aged Out    Health Maintenance  Health Maintenance Due  Topic Date Due   DTaP/Tdap/Td (1 - Tdap) Never done   COVID-19 Vaccine (4 - 2024-25 season) 07/29/2023   INFLUENZA VACCINE  11/27/2023   Health Maintenance Items Addressed:   Additional Screening:  Vision Screening: Recommended annual ophthalmology exams for early detection of glaucoma and other disorders of the eye. Would you like a referral to an eye doctor? No    Dental Screening: Recommended annual dental exams for proper oral hygiene  Community Resource Referral / Chronic Care Management: CRR required this visit?  No   CCM required this visit?  No   Plan:    I have personally reviewed and noted the following in the patient's chart:   Medical and social history Use of alcohol, tobacco or illicit drugs  Current medications and supplements including opioid prescriptions. Patient is not currently taking opioid prescriptions. Functional ability and status Nutritional status Physical activity Advanced directives List of other physicians Hospitalizations, surgeries, and ER visits in previous 12 months Vitals Screenings to include cognitive, depression, and falls Referrals and appointments  In addition, I have  reviewed and discussed with patient certain preventive protocols, quality metrics, and best practice recommendations. A written personalized care plan for preventive services as well as general preventive health recommendations were provided to patient.   Rojelio LELON Blush, LPN   [id number]/[id number]   After Visit Summary: (MyChart) Due to this being a telephonic visit, the after visit summary with patients personalized plan was offered to patient via MyChart   Notes: Nothing significant to report at this time.

## 2023-12-10 NOTE — Patient Instructions (Addendum)
 Victoria Clarke , Thank you for taking time out of your busy schedule to complete your Annual Wellness Visit with me. I enjoyed our conversation and look forward to speaking with you again next year. I, as well as your care team,  appreciate your ongoing commitment to your health goals. Please review the following plan we discussed and let me know if I can assist you in the future. Your Game plan/ To Do List    Referrals: If you haven't heard from the office you've been referred to, please reach out to them at the phone provided.   Follow up Visits: We will see or speak with you next year for your Next Medicare AWV with our clinical staff 12/23/24 @ 9:30a Have you seen your provider in the last 6 months (3 months if uncontrolled diabetes)?                  Next appointment with provider 12/21/23 @ 8:40a  Clinician Recommendations:  Aim for 30 minutes of exercise or brisk walking, 6-8 glasses of water , and 5 servings of fruits and vegetables each day.       This is a list of the screenings recommended for you:  Health Maintenance  Topic Date Due   DTaP/Tdap/Td vaccine (1 - Tdap) Never done   COVID-19 Vaccine (4 - 2024-25 season) 07/29/2023   Flu Shot  11/27/2023   Mammogram  10/12/2024   Medicare Annual Wellness Visit  12/09/2024   Colon Cancer Screening  07/29/2032   Pneumococcal Vaccine for age over 36  Completed   DEXA scan (bone density measurement)  Completed   Hepatitis C Screening  Completed   Zoster (Shingles) Vaccine  Completed   HPV Vaccine  Aged Out   Meningitis B Vaccine  Aged Out    Advanced directives: (Declined) Advance directive discussed with you today. Even though you declined this today, please call our office should you change your mind, and we can give you the proper paperwork for you to fill out. Advance Care Planning is important because it:  [x]  Makes sure you receive the medical care that is consistent with your values, goals, and preferences  [x]  It provides  guidance to your family and loved ones and reduces their decisional burden about whether or not they are making the right decisions based on your wishes.  Follow the link provided in your after visit summary or read over the paperwork we have mailed to you to help you started getting your Advance Directives in place. If you need assistance in completing these, please reach out to us  so that we can help you!  See attachments for Preventive Care and Fall Prevention Tips.

## 2023-12-17 ENCOUNTER — Other Ambulatory Visit: Payer: Self-pay | Admitting: Internal Medicine

## 2023-12-18 NOTE — Telephone Encounter (Signed)
 Requested Prescriptions  Pending Prescriptions Disp Refills   amLODipine -olmesartan  (AZOR ) 10-40 MG tablet [Pharmacy Med Name: AMLODIPINE /OLMESARTAN  TABS 10/40MG ] 90 tablet 0    Sig: TAKE 1 TABLET DAILY     Cardiovascular: CCB + ARB Combos Failed - 12/18/2023  8:18 AM      Failed - Cr in normal range and within 180 days    Creat  Date Value Ref Range Status  06/22/2023 1.13 (H) 0.60 - 1.00 mg/dL Final   Creatinine, Ser  Date Value Ref Range Status  10/08/2023 1.67 (H) 0.57 - 1.00 mg/dL Final         Passed - K in normal range and within 180 days    Potassium  Date Value Ref Range Status  10/08/2023 4.9 3.5 - 5.2 mmol/L Final         Passed - Na in normal range and within 180 days    Sodium  Date Value Ref Range Status  10/08/2023 139 134 - 144 mmol/L Final         Passed - Patient is not pregnant      Passed - Last BP in normal range    BP Readings from Last 1 Encounters:  11/19/23 112/61         Passed - Valid encounter within last 6 months    Recent Outpatient Visits           4 months ago Sepsis due to Escherichia coli with acute renal failure and septic shock, unspecified acute renal failure type [facility])   [facility] [place], Angeline ORN, NP   5 months ago Toenail deformity   [place] [facility] [place], Angeline ORN, [place]

## 2023-12-21 ENCOUNTER — Encounter: Payer: Self-pay | Admitting: Internal Medicine

## 2023-12-21 ENCOUNTER — Ambulatory Visit (INDEPENDENT_AMBULATORY_CARE_PROVIDER_SITE_OTHER): Payer: Managed Care, Other (non HMO) | Admitting: Internal Medicine

## 2023-12-21 VITALS — BP 110/60 | Ht 63.0 in | Wt 157.6 lb

## 2023-12-21 DIAGNOSIS — I10 Essential (primary) hypertension: Secondary | ICD-10-CM

## 2023-12-21 DIAGNOSIS — E559 Vitamin D deficiency, unspecified: Secondary | ICD-10-CM

## 2023-12-21 DIAGNOSIS — Z905 Acquired absence of kidney: Secondary | ICD-10-CM

## 2023-12-21 DIAGNOSIS — R739 Hyperglycemia, unspecified: Secondary | ICD-10-CM

## 2023-12-21 DIAGNOSIS — Z6827 Body mass index (BMI) 27.0-27.9, adult: Secondary | ICD-10-CM

## 2023-12-21 DIAGNOSIS — M810 Age-related osteoporosis without current pathological fracture: Secondary | ICD-10-CM

## 2023-12-21 DIAGNOSIS — E782 Mixed hyperlipidemia: Secondary | ICD-10-CM | POA: Diagnosis not present

## 2023-12-21 DIAGNOSIS — D649 Anemia, unspecified: Secondary | ICD-10-CM | POA: Diagnosis not present

## 2023-12-21 DIAGNOSIS — E663 Overweight: Secondary | ICD-10-CM

## 2023-12-21 MED ORDER — AMLODIPINE-OLMESARTAN 10-40 MG PO TABS: 0.5000 | ORAL_TABLET | Freq: Every day | ORAL | Status: DC

## 2023-12-21 NOTE — Assessment & Plan Note (Signed)
 She will continue to follow with nephrology [facility] today

## 2023-12-21 NOTE — Patient Instructions (Signed)

## 2023-12-21 NOTE — Assessment & Plan Note (Signed)
 Will decrease amlodipine -olmesartan  10-40 mg to half tab daily (5-20) given her dizziness and low blood pressure readings at home Reinforced DASH diet and exercise for weight loss C-Met today

## 2023-12-21 NOTE — Progress Notes (Signed)
 Subjective:    Patient ID: Victoria Clarke, female    DOB: 1950/05/06, 73 y.o.   MRN: [id number]  HPI  Patient presents to clinic today for follow-up of chronic conditions.   HTN: Her BP today is 112/62  She is taking amlodipine -olmesartan  as prescribed. She is having dizziness with position changes and head movements. Her BP at home runs 80/60. ECG from 08/2023 reviewed.    HLD: Her last LDL was 64, triglycerides 801, 05/2023.  She denies myalgias on atorvastatin .  She tries to consume low-fat diet.   Osteoporosis: She is taking vitamin D  OTC but no calcium .  She has never been treated for osteoporosis in the past and refused referral to endocrinology for prolia injections.  She tries to get some weightbearing exercise in.  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 She continues to follow with nephrologist.  Anemia: Her last H/H was 11.9/38.3, 11/2023. She is taking oral iron as prescribed. She follows with hematology.  Review of Systems   Past Medical History:  Diagnosis Date   Colon polyp    Former smoker    High cholesterol    Hydronephrosis of left kidney    Hypertension 05/2022   Left renal atrophy    Normocytic anemia    Obesity    Recurrent UTI (urinary tract infection)    Sepsis (HCC)    Ureteral obstruction, left     Current Outpatient Medications  Medication Sig Dispense Refill   amLODipine -olmesartan  (AZOR ) 10-40 MG tablet TAKE 1 TABLET DAILY 90 tablet 0   atorvastatin  (LIPITOR) 20 MG tablet TAKE 1 TABLET DAILY 90 tablet 2   Cholecalciferol 25 MCG (1000 UT) capsule Take 1,000 Units by mouth daily.     Ferrous Sulfate (IRON) 28 MG TABS Take by mouth daily.     No current facility-administered medications for this visit.    No Known Allergies  Family History  Problem Relation Age of Onset   Breast cancer Mother 42       Low grade DCIS   Dementia Mother    Dementia Father    Colon polyps Father    Hypertension  Father    Breast cancer Sister 57   Healthy Sister    Cancer Maternal Aunt        breast/great Aunt    Social History   Socioeconomic History   Marital status: Married    Spouse name: Not on file   Number of children: Not on file   Years of education: Not on file   Highest education level: Some college, no degree  Occupational History   Not on file  Tobacco Use   Smoking status: Former    Current packs/day: 0.00    Average packs/day: 1 pack/day for 30.0 years (30.0 ttl pk-yrs)    Types: Cigarettes    Start date: 04/28/1969    Quit date: 04/29/1999    Years since quitting: 24.6   Smokeless tobacco: Never  Vaping Use   Vaping status: Never Used  Substance and Sexual Activity   Alcohol use: No   Drug use: No   Sexual activity: Not Currently    Birth control/protection: Diaphragm, None  Other Topics Concern   Not on file  Social History Narrative   Not on file   Social Drivers of Health   Financial Resource Strain: Low Risk  (12/10/2023)   Overall Financial Resource Strain (CARDIA)    Difficulty of Paying Living Expenses: Not hard at all  Food  Insecurity: No Food Insecurity (12/10/2023)   Hunger Vital Sign    Worried About Running Out of Food in the Last Year: Never true    Ran Out of Food in the Last Year: Never true  Transportation Needs: No Transportation Needs (12/10/2023)   PRAPARE - Administrator, Civil Service (Medical): No    Lack of Transportation (Non-Medical): No  Physical Activity: Insufficiently Active (12/10/2023)   Exercise Vital Sign    Days of Exercise per Week: 7 days    Minutes of Exercise per Session: 20 min  Stress: No Stress Concern Present (12/10/2023)   [employer] of Occupational Health - Occupational Stress Questionnaire    Feeling of Stress: Not at all  Social Connections: Moderately Isolated (12/10/2023)   Social Connection and Isolation Panel    Frequency of Communication with Friends and Family: More than three times a week     Frequency of Social Gatherings with Friends and Family: More than three times a week    Attends Religious Services: Never    Database administrator or Organizations: No    Attends Banker Meetings: Never    Marital Status: Married  Catering manager Violence: Not At Risk (12/10/2023)   Humiliation, Afraid, Rape, and Kick questionnaire    Fear of Current or Ex-Partner: No    Emotionally Abused: No    Physically Abused: No    Sexually Abused: No     Constitutional: Denies fever, malaise, fatigue, headache or abrupt weight changes.  HEENT: Denies eye pain, eye redness, ear pain, ringing in the ears, wax buildup, runny nose, nasal congestion, bloody nose, or sore throat. Respiratory: Denies difficulty breathing, shortness of breath, cough or sputum production.   Cardiovascular: Denies chest pain, chest tightness, palpitations or swelling in the hands or feet.  Gastrointestinal: Denies abdominal pain, bloating, constipation, diarrhea or blood in the stool.  GU: Denies urgency, frequency, pain with urination, burning sensation, blood in urine, odor or discharge. Musculoskeletal: Denies decrease in range of motion, difficulty with gait, muscle pain or joint pain and swelling.  Skin: Denies redness, rashes, lesions or ulcercations.  Neurological: Denies dizziness, difficulty with memory, difficulty with speech or problems with balance and coordination.  Psych: Denies anxiety, depression, SI/HI.  No other specific complaints in a complete review of systems (except as listed in HPI above).      Objective:   Physical Exam  BP 110/60 (BP Location: Left Arm, Patient Position: Sitting, Cuff Size: Normal)   Ht 5' 3 (1.6 m)   Wt 157 lb 9.6 oz (71.5 kg)   BMI 27.92 kg/m   Wt Readings from Last 3 Encounters:  12/21/23 157 lb 9.6 oz (71.5 kg)  12/10/23 157 lb (71.2 kg)  11/19/23 157 lb (71.2 kg)    General: Appears her stated age, overweight, in NAD. Skin: Warm, dry and  intact.   HEENT: Head: normal shape and size; Eyes: sclera white, no icterus, conjunctiva pink, PERRLA and EOMs intact;  Cardiovascular: Normal rate and rhythm. S1,S2 noted.  No murmur, rubs or gallops noted. No JVD or BLE edema. No carotid bruits noted. Pulmonary/Chest: Normal effort and positive vesicular breath sounds. No respiratory distress. No wheezes, rales or ronchi noted.  Musculoskeletal: No difficulty with gait.  Neurological: Alert and oriented. Coordination normal.  Psychiatric: Mood and affect normal. Behavior is normal. Judgment and thought content normal.    BMET    Component Value Date/Time   NA 139 10/08/2023 1041   K 4.9  10/08/2023 1041   CL 100 10/08/2023 1041   CO2 20 10/08/2023 1041   GLUCOSE 88 10/08/2023 1041   GLUCOSE 96 09/06/2023 0426   BUN 24 10/08/2023 1041   CREATININE 1.67 (H) 10/08/2023 1041   CREATININE 1.13 (H) 06/22/2023 0826   CALCIUM  9.9 10/08/2023 1041   GFRNONAA 15 (L) 09/06/2023 0426    Lipid Panel     Component Value Date/Time   CHOL 125 06/22/2023 0826   TRIG 198 (H) 06/22/2023 0826   HDL 34 (L) 06/22/2023 0826   CHOLHDL 3.7 06/22/2023 0826   LDLCALC 64 06/22/2023 0826    CBC    Component Value Date/Time   WBC 7.2 09/06/2023 0426   RBC 2.98 (L) 09/06/2023 0426   HGB 8.0 (L) 09/06/2023 0426   HCT 26.1 (L) 09/06/2023 0426   PLT 287 09/06/2023 0426   MCV 87.6 09/06/2023 0426   MCH 26.8 09/06/2023 0426   MCHC 30.7 09/06/2023 0426   RDW 16.2 (H) 09/06/2023 0426   LYMPHSABS 1.3 09/01/2023 2221   MONOABS 0.5 09/01/2023 2221   EOSABS 0.0 09/01/2023 2221   BASOSABS 0.0 09/01/2023 2221    Hgb A1C Lab Results  Component Value Date   HGBA1C 5.4 06/22/2023            Assessment & Plan:     RTC in 6 months, follow up chronic condition Angeline Laura, NP

## 2023-12-21 NOTE — Assessment & Plan Note (Signed)
 CBC today Continue oral iron 28 mg daily She will continue to follow with hematology

## 2023-12-21 NOTE — Assessment & Plan Note (Signed)
 Encouraged her to continue vitamin D  and to start calcium  OTC as this will aid in absorption She declines treatment for osteoporosis at this time Encouraged daily weightbearing exercise Will check Vit D levels

## 2023-12-21 NOTE — Assessment & Plan Note (Signed)
 CMET and lipid profile today Encouraged her to consume a low fat diet Continue atorvastatin  20 mg daily

## 2023-12-21 NOTE — Assessment & Plan Note (Signed)
 Encourage diet and exercise for weight loss

## 2023-12-22 ENCOUNTER — Ambulatory Visit: Payer: Self-pay | Admitting: Internal Medicine

## 2023-12-22 LAB — CBC
HCT: 38 % (ref 35.0–45.0)
Hemoglobin: 11.7 g/dL (ref 11.7–15.5)
MCH: 27.5 pg (ref 27.0–33.0)
MCHC: 30.8 g/dL — ABNORMAL LOW (ref 32.0–36.0)
MCV: 89.2 fL (ref 80.0–100.0)
MPV: 11.3 fL (ref 7.5–12.5)
Platelets: 173 Thousand/uL (ref 140–400)
RBC: 4.26 Million/uL (ref 3.80–5.10)
RDW: 14 % (ref 11.0–15.0)
WBC: 7.4 Thousand/uL (ref 3.8–10.8)

## 2023-12-22 LAB — LIPID PANEL
Cholesterol: 118 mg/dL (ref <200)
HDL: 36 mg/dL — ABNORMAL LOW (ref >=50)
LDL Cholesterol (Calc): 60 mg/dL
Non-HDL Cholesterol (Calc): 82 mg/dL (ref <130)
Total CHOL/HDL Ratio: 3.3 (calc) (ref <5.0)
Triglycerides: 139 mg/dL (ref <150)

## 2023-12-22 LAB — COMPREHENSIVE METABOLIC PANEL WITH GFR
AG Ratio: 1.4 (calc) (ref 1.0–2.5)
ALT: 29 U/L (ref 6–29)
AST: 28 U/L (ref 10–35)
Albumin: 4 g/dL (ref 3.6–5.1)
Alkaline phosphatase (APISO): 66 U/L (ref 37–153)
BUN/Creatinine Ratio: 21 (calc) (ref 6–22)
BUN: 28 mg/dL — ABNORMAL HIGH (ref 7–25)
CO2: 29 mmol/L (ref 20–32)
Calcium: 9.5 mg/dL (ref 8.6–10.4)
Chloride: 103 mmol/L (ref 98–110)
Creat: 1.36 mg/dL — ABNORMAL HIGH (ref 0.60–1.00)
Globulin: 2.9 g/dL (ref 1.9–3.7)
Glucose, Bld: 88 mg/dL (ref 65–99)
Potassium: 5.4 mmol/L — ABNORMAL HIGH (ref 3.5–5.3)
Sodium: 139 mmol/L (ref 135–146)
Total Bilirubin: 0.4 mg/dL (ref 0.2–1.2)
Total Protein: 6.9 g/dL (ref 6.1–8.1)
eGFR: 41 mL/min/1.73m2 — ABNORMAL LOW (ref >=60)

## 2023-12-22 LAB — HEMOGLOBIN A1C
Hgb A1c MFr Bld: 5.4 % (ref <5.7)
Mean Plasma Glucose: 108 mg/dL
eAG (mmol/L): 6 mmol/L

## 2023-12-22 LAB — VITAMIN D 25 HYDROXY (VIT D DEFICIENCY, FRACTURES): Vit D, 25-Hydroxy: 78 ng/mL (ref 30–100)

## 2023-12-24 NOTE — Progress Notes (Signed)
 Medicare wellness performed by LPN.  Note and chart reviewed. Angeline Laura, NP

## 2024-01-04 ENCOUNTER — Encounter: Payer: Self-pay | Admitting: Internal Medicine

## 2024-01-17 ENCOUNTER — Encounter: Payer: Self-pay | Admitting: Internal Medicine

## 2024-02-05 ENCOUNTER — Inpatient Hospital Stay: Attending: Internal Medicine

## 2024-02-05 ENCOUNTER — Telehealth: Payer: Self-pay | Admitting: Nurse Practitioner

## 2024-02-05 DIAGNOSIS — N183 Chronic kidney disease, stage 3 unspecified: Secondary | ICD-10-CM | POA: Diagnosis present

## 2024-02-05 DIAGNOSIS — D631 Anemia in chronic kidney disease: Secondary | ICD-10-CM | POA: Insufficient documentation

## 2024-02-05 DIAGNOSIS — Z79899 Other long term (current) drug therapy: Secondary | ICD-10-CM | POA: Diagnosis not present

## 2024-02-05 DIAGNOSIS — D649 Anemia, unspecified: Secondary | ICD-10-CM

## 2024-02-05 LAB — CMP (CANCER CENTER ONLY)
ALT: 41 U/L (ref 0–44)
AST: 33 U/L (ref 15–41)
Albumin: 3.8 g/dL (ref 3.5–5.0)
Alkaline Phosphatase: 67 U/L (ref 38–126)
Anion gap: 8 (ref 5–15)
BUN: 21 mg/dL (ref 8–23)
CO2: 27 mmol/L (ref 22–32)
Calcium: 9.3 mg/dL (ref 8.9–10.3)
Chloride: 99 mmol/L (ref 98–111)
Creatinine: 1.01 mg/dL — ABNORMAL HIGH (ref 0.44–1.00)
GFR, Estimated: 58 mL/min — ABNORMAL LOW (ref >60)
Glucose, Bld: 96 mg/dL (ref 70–99)
Potassium: 4.3 mmol/L (ref 3.5–5.1)
Sodium: 134 mmol/L — ABNORMAL LOW (ref 135–145)
Total Bilirubin: 0.8 mg/dL (ref 0.0–1.2)
Total Protein: 7 g/dL (ref 6.5–8.1)

## 2024-02-05 LAB — FOLATE: Folate: >20 ng/mL (ref >5.9)

## 2024-02-05 LAB — LACTATE DEHYDROGENASE: LDH: 128 U/L (ref 98–192)

## 2024-02-05 LAB — CBC WITH DIFFERENTIAL (CANCER CENTER ONLY)
Abs Immature Granulocytes: 0.05 K/uL (ref 0.00–0.07)
Basophils Absolute: 0.1 K/uL (ref 0.0–0.1)
Basophils Relative: 1 %
Eosinophils Absolute: 0.2 K/uL (ref 0.0–0.5)
Eosinophils Relative: 3 %
HCT: 37.1 % (ref 36.0–46.0)
Hemoglobin: 12 g/dL (ref 12.0–15.0)
Immature Granulocytes: 1 %
Lymphocytes Relative: 33 %
Lymphs Abs: 2.6 K/uL (ref 0.7–4.0)
MCH: 28.4 pg (ref 26.0–34.0)
MCHC: 32.3 g/dL (ref 30.0–36.0)
MCV: 87.7 fL (ref 80.0–100.0)
Monocytes Absolute: 0.5 K/uL (ref 0.1–1.0)
Monocytes Relative: 6 %
Neutro Abs: 4.5 K/uL (ref 1.7–7.7)
Neutrophils Relative %: 56 %
Platelet Count: 172 K/uL (ref 150–400)
RBC: 4.23 MIL/uL (ref 3.87–5.11)
RDW: 14.6 % (ref 11.5–15.5)
WBC Count: 7.8 K/uL (ref 4.0–10.5)
nRBC: 0 % (ref 0.0–0.2)

## 2024-02-05 LAB — VITAMIN B12: Vitamin B-12: 450 pg/mL (ref 180–914)

## 2024-02-05 LAB — RETICULOCYTES
Immature Retic Fract: 8.9 % (ref 2.3–15.9)
RBC.: 4.24 MIL/uL (ref 3.87–5.11)
Retic Count, Absolute: 65.7 K/uL (ref 19.0–186.0)
Retic Ct Pct: 1.6 % (ref 0.4–3.1)

## 2024-02-05 LAB — IRON AND TIBC
Iron: 86 ug/dL (ref 28–170)
Saturation Ratios: 26 % (ref 10.4–31.8)
TIBC: 329 ug/dL (ref 250–450)
UIBC: 243 ug/dL

## 2024-02-05 LAB — TECHNOLOGIST SMEAR REVIEW: Plt Morphology: ADEQUATE

## 2024-02-05 LAB — FERRITIN: Ferritin: 75 ng/mL (ref 11–307)

## 2024-02-05 NOTE — Telephone Encounter (Signed)
 Called patient to reschedule appointment due to provider being out of office. Left voicemail for her to call back.

## 2024-02-06 LAB — ERYTHROPOIETIN: Erythropoietin: 13 m[IU]/mL (ref 2.6–18.5)

## 2024-02-07 LAB — HAPTOGLOBIN: Haptoglobin: 79 mg/dL (ref 42–346)

## 2024-02-08 LAB — KAPPA/LAMBDA LIGHT CHAINS
Kappa free light chain: 42.1 mg/L — ABNORMAL HIGH (ref 3.3–19.4)
Kappa, lambda light chain ratio: 1.32 (ref 0.26–1.65)
Lambda free light chains: 32 mg/L — ABNORMAL HIGH (ref 5.7–26.3)

## 2024-02-09 LAB — MULTIPLE MYELOMA PANEL, SERUM
Albumin SerPl Elph-Mcnc: 3.6 g/dL (ref 2.9–4.4)
Albumin/Glob SerPl: 1.1 (ref 0.7–1.7)
Alpha 1: 0.3 g/dL (ref 0.0–0.4)
Alpha2 Glob SerPl Elph-Mcnc: 0.8 g/dL (ref 0.4–1.0)
B-Globulin SerPl Elph-Mcnc: 1.2 g/dL (ref 0.7–1.3)
Gamma Glob SerPl Elph-Mcnc: 1.1 g/dL (ref 0.4–1.8)
Globulin, Total: 3.4 g/dL (ref 2.2–3.9)
IgA: 584 mg/dL — ABNORMAL HIGH (ref 64–422)
IgG (Immunoglobin G), Serum: 1074 mg/dL (ref 586–1602)
IgM (Immunoglobulin M), Srm: 51 mg/dL (ref 26–217)
Total Protein ELP: 7 g/dL (ref 6.0–8.5)

## 2024-02-19 ENCOUNTER — Inpatient Hospital Stay: Admitting: Nurse Practitioner

## 2024-02-19 ENCOUNTER — Ambulatory Visit: Admitting: Internal Medicine

## 2024-02-19 ENCOUNTER — Inpatient Hospital Stay

## 2024-02-19 ENCOUNTER — Ambulatory Visit

## 2024-02-23 ENCOUNTER — Inpatient Hospital Stay

## 2024-02-23 ENCOUNTER — Inpatient Hospital Stay: Admitting: Nurse Practitioner

## 2024-02-23 ENCOUNTER — Encounter: Payer: Self-pay | Admitting: Nurse Practitioner

## 2024-02-23 VITALS — BP 158/76 | HR 83 | Temp 98.7°F | Resp 16 | Ht 63.0 in | Wt 162.1 lb

## 2024-02-23 DIAGNOSIS — N183 Chronic kidney disease, stage 3 unspecified: Secondary | ICD-10-CM | POA: Insufficient documentation

## 2024-02-23 DIAGNOSIS — Z905 Acquired absence of kidney: Secondary | ICD-10-CM | POA: Diagnosis not present

## 2024-02-23 NOTE — Addendum Note (Signed)
 Addended by: LAEL BROWNING A on: 02/23/2024 02:35 PM   Modules accepted: Orders

## 2024-02-23 NOTE — Progress Notes (Signed)
 No concerns today

## 2024-02-23 NOTE — Progress Notes (Signed)
 [facility] CONSULT NOTE  Patient Care Team: Antonette Angeline ORN, NP as PCP - General (Internal Medicine) Elenor Raisin, NP as Nurse Practitioner (Family Medicine) Dessa, Reyes ORN, MD (General Surgery) Rennie Cindy SAUNDERS, MD as Consulting Physician (Oncology)  CHIEF COMPLAINTS/PURPOSE OF CONSULTATION: ANEMIA   HEMATOLOGY HISTORY  # ANEMIA[Hb; MCV-platelets- WBC; Iron sat; ferritin;  GFR- CT/US ; EGD/colonoscopy-  # CKD- [Dr.]  HISTORY OF PRESENTING ILLNESS:  Victoria Clarke 73 y.o.  female pleasant patient with history of anemia, now on gentle iron orally once a day, who returns to clinic for routine follow-up.  She is tolerating oral iron well without significant side effects.  Fatigue has improved.  Denies any neurologic complaints. Denies recent fevers or illnesses. Denies any easy bleeding or bruising. No melena or hematochezia. No pica or restless leg. Reports good appetite and denies weight loss. Denies chest pain. Denies any nausea, vomiting, constipation, or diarrhea. Denies urinary complaints. Patient offers no further specific complaints today.    Review of Systems  Constitutional:  Positive for malaise/fatigue. Negative for chills, diaphoresis, fever and weight loss.  HENT:  Negative for nosebleeds and sore throat.   Eyes:  Negative for double vision.  Respiratory:  Negative for cough, hemoptysis, sputum production, shortness of breath and wheezing.   Cardiovascular:  Negative for chest pain, palpitations, orthopnea and leg swelling.  Gastrointestinal:  Negative for abdominal pain, blood in stool, constipation, diarrhea, heartburn, melena, nausea and vomiting.  Genitourinary:  Negative for dysuria, frequency and urgency.  Musculoskeletal:  Negative for back pain and joint pain.  Skin: Negative.  Negative for itching and rash.  Neurological:  Negative for dizziness, tingling, focal weakness, weakness and headaches.  Endo/Heme/Allergies:  Does not bruise/bleed  easily.  Psychiatric/Behavioral:  Negative for depression. The patient is not nervous/anxious and does not have insomnia.      MEDICAL HISTORY:  Past Medical History:  Diagnosis Date   Colon polyp    Former smoker    High cholesterol    Hydronephrosis of left kidney    Hypertension 05/2022   Left renal atrophy    Normocytic anemia    Obesity    Recurrent UTI (urinary tract infection)    Sepsis (HCC)    Ureteral obstruction, left     SURGICAL HISTORY: Past Surgical History:  Procedure Laterality Date   BREAST BIOPSY Left 04-26-14   ductal hyperplasia, 1 mm papilloma.   BREAST SURGERY Right 10/25/2009   milk duct excision in [place] , atypical duct epithelial proliferation l   CESAREAN SECTION  1981   CHOLECYSTECTOMY  2006   COLONOSCOPY  2016   Dr Viktoria   COLONOSCOPY WITH PROPOFOL  N/A 07/30/2022   Procedure: COLONOSCOPY WITH PROPOFOL ;  Surgeon: Therisa Bi, MD;  [facility] ENDOSCOPY;  Service: Gastroenterology;  Laterality: N/A;   CYSTOSCOPY W/ URETERAL STENT PLACEMENT Left 07/22/2023   Procedure: CYSTOSCOPY, WITH RETROGRADE PYELOGRAM AND URETERAL STENT INSERTION;  Surgeon: Penne Knee, MD;  Location: [facility] ORS;  Service: Urology;  Laterality: Left;   CYSTOSCOPY W/ URETERAL STENT REMOVAL Left 08/31/2023   Procedure: REMOVAL, STENT, URETER;  Surgeon: Penne Knee, MD;  Location: [facility] ORS;  Service: Urology;  Laterality: Left;   EXCISION / BIOPSY BREAST / NIPPLE / DUCT Right 2011   duct excision   LAPAROSCOPIC NEPHRECTOMY, HAND ASSISTED Left 08/31/2023   Procedure: NEPHRECTOMY, HAND-ASSISTED, LAPAROSCOPIC;  Surgeon: Penne Knee, MD;  Location: [facility] ORS;  Service: Urology;  Laterality: Left;    SOCIAL HISTORY: Social History   Socioeconomic History  Marital status: Married    Spouse name: Not on file   Number of children: Not on file   Years of education: Not on file   Highest education level: Some college, no degree  Occupational History   Not on file   Tobacco Use   Smoking status: Former    Current packs/day: 0.00    Average packs/day: 1 pack/day for 30.0 years (30.0 ttl pk-yrs)    Types: Cigarettes    Start date: 04/28/1969    Quit date: 04/29/1999    Years since quitting: 24.8   Smokeless tobacco: Never  Vaping Use   Vaping status: Never Used  Substance and Sexual Activity   Alcohol use: No   Drug use: No   Sexual activity: Not Currently    Birth control/protection: Diaphragm, None  Other Topics Concern   Not on file  Social History Narrative   Not on file   Social Drivers of Health   Financial Resource Strain: Low Risk  (12/10/2023)   Overall Financial Resource Strain (CARDIA)    Difficulty of Paying Living Expenses: Not hard at all  Food Insecurity: No Food Insecurity (12/10/2023)   Hunger Vital Sign    Worried About Running Out of Food in the Last Year: Never true    Ran Out of Food in the Last Year: Never true  Transportation Needs: No Transportation Needs (12/10/2023)   PRAPARE - Administrator, Civil Service (Medical): No    Lack of Transportation (Non-Medical): No  Physical Activity: Insufficiently Active (12/10/2023)   Exercise Vital Sign    Days of Exercise per Week: 7 days    Minutes of Exercise per Session: 20 min  Stress: No Stress Concern Present (12/10/2023)   [employer] of Occupational Health - Occupational Stress Questionnaire    Feeling of Stress: Not at all  Social Connections: Moderately Isolated (12/10/2023)   Social Connection and Isolation Panel    Frequency of Communication with Friends and Family: More than three times a week    Frequency of Social Gatherings with Friends and Family: More than three times a week    Attends Religious Services: Never    Database Administrator or Organizations: No    Attends Banker Meetings: Never    Marital Status: Married  Catering Manager Violence: Not At Risk (12/10/2023)   Humiliation, Afraid, Rape, and Kick questionnaire    Fear  of Current or Ex-Partner: No    Emotionally Abused: No    Physically Abused: No    Sexually Abused: No    FAMILY HISTORY: Family History  Problem Relation Age of Onset   Breast cancer Mother 21       Low grade DCIS   Dementia Mother    Dementia Father    Colon polyps Father    Hypertension Father    Breast cancer Sister 107   Healthy Sister    Cancer Maternal Aunt        breast/great Aunt    ALLERGIES:  has no known allergies.  MEDICATIONS:  Current Outpatient Medications  Medication Sig Dispense Refill   atorvastatin  (LIPITOR) 20 MG tablet TAKE 1 TABLET DAILY 90 tablet 2   Ferrous Sulfate (IRON) 28 MG TABS Take by mouth daily.     No current facility-administered medications for this visit.     PHYSICAL EXAMINATION: Vitals:   02/23/24 1314  BP: (!) 158/76  Pulse: 83  Resp: 16  Temp: 98.7 F (37.1 C)  SpO2: 100%  Filed Weights   02/23/24 1314  Weight: 162 lb 1.6 oz (73.5 kg)    Physical Exam Constitutional:      Appearance: She is not ill-appearing.  Eyes:     General: No scleral icterus.    Conjunctiva/sclera: Conjunctivae normal.  Cardiovascular:     Rate and Rhythm: Normal rate and regular rhythm.  Abdominal:     General: There is no distension.     Palpations: Abdomen is soft.     Tenderness: There is no abdominal tenderness. There is no guarding.  Musculoskeletal:        General: No deformity.     Right lower leg: No edema.     Left lower leg: No edema.  Lymphadenopathy:     Cervical: No cervical adenopathy.  Skin:    General: Skin is warm and dry.  Neurological:     Mental Status: She is alert and oriented to person, place, and time. Mental status is at baseline.  Psychiatric:        Mood and Affect: Mood normal.        Behavior: Behavior normal.      LABORATORY DATA:  I have reviewed the data as listed Lab Results  Component Value Date   WBC 7.8 02/05/2024   HGB 12.0 02/05/2024   HCT 37.1 02/05/2024   MCV 87.7 02/05/2024   PLT  172 02/05/2024   Recent Labs    08/15/23 1652 09/01/23 0459 09/01/23 2221 09/02/23 0414 09/05/23 0419 09/06/23 0426 10/01/23 0846 10/08/23 1041 12/21/23 0855 02/05/24 1144  NA 132*   < > 134*   < > 140 138   < > 139 139 134*  K 4.6   < > 5.0   < > 4.8 4.7   < > 4.9 5.4* 4.3  CL 96*   < > 106   < > 112* 111   < > 100 103 99  CO2 26   < > 20*   < > 22 20*   < > 20 29 27   GLUCOSE 140*   < > 109*   < > 96 96   < > 88 88 96  BUN 18   < > 31*   < > 52* 47*   < > 24 28* 21  CREATININE 1.04*   < > 3.36*   < > 3.46* 3.14*   < > 1.67* 1.36* 1.01*  CALCIUM  9.5   < > 7.8*   < > 8.3* 8.2*   < > 9.9 9.5 9.3  GFRNONAA 57*   < > 14*   < > 13* 15*  --   --   --  58*  PROT 7.7  --  5.6*  --   --   --   --   --  6.9 7.0  ALBUMIN  3.2*  --  2.0*  --   --   --   --   --   --  3.8  AST 17  --  18  --   --   --   --   --  28 33  ALT 14  --  5  --   --   --   --   --  29 41  ALKPHOS 71  --  49  --   --   --   --   --   --  67  BILITOT 1.3*  --  0.4  --   --   --   --   --  0.4 0.8  BILIDIR 0.2  --   --   --   --   --   --   --   --   --   IBILI 1.1*  --   --   --   --   --   --   --   --   --    < > = values in this interval not displayed.   Iron/TIBC/Ferritin/ %Sat    Component Value Date/Time   IRON 86 02/05/2024 1144   TIBC 329 02/05/2024 1144   FERRITIN 75 02/05/2024 1144   IRONPCTSAT 26 02/05/2024 1144     No results found.  ASSESSMENT & PLAN:   # Chronic anemia-normocytic -mild symptomatic hemoglobin-around 10-June 25 [improved from 8 in May] no iron studies.  Etiology thought to be secondary to chronic kidney disease.   # She started gentle iron 1 pill a day.  Tolerating well.  Ferritin 75, iron sat 26%.  Hold IV iron.  Would consider iron transfusion if hemoglobin trending down her intolerance to oral iron.  Hemoglobin has normalized to 12.  hold off on starting ESA's.  Would consider starting ESA if hemoglobin persistently less than 10.  Goal hematocrit would be around 11.  #  Today, we reviewed the CBC, CMP, LDH, haptoglobin, Rithron Brereton, iron studies, ferritin, B12, folic acid, reticulocyte count which were all normal.  I reviewed her multiple myeloma panel which showed a increase of her IgA and at 584.  This may be related to chronic inflammatory or immune response.  Additionally, her kappa and lambda free light chains were elevated but a normal ratio suggestive of a polyclonal process.  IFE confirms multiple types of immunoglobulins are elevated supporting a polyclonal pattern.  M protein is not elevated ruling out MGUS.  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  Recommend monitoring.  Hold off on any bone marrow at this time.   # CKD-stage III [status post left nephrectomy-renal stone]-continue follow-up with Dr. Marcelino   Thank you Dr. Marcelino  for allowing me to participate in the care of your pleasant patient. Please do not hesitate to contact me with questions or concerns in the interim.   # DISPOSITION: 4 mo- lab (cbc, cmp, ferritin, iron studies), Dr Rennie- la  No problem-specific Assessment & Plan notes found for this encounter.  All questions were answered. The patient knows to call the clinic with any problems, questions or concerns.  Tinnie KANDICE Dawn, NP 02/23/2024

## 2024-03-16 ENCOUNTER — Other Ambulatory Visit: Payer: Self-pay | Admitting: Internal Medicine

## 2024-03-18 NOTE — Telephone Encounter (Signed)
 Requested Prescriptions  Pending Prescriptions Disp Refills   atorvastatin  (LIPITOR) 20 MG tablet [Pharmacy Med Name: ATORVASTATIN  TABS 20MG ] 90 tablet 3    Sig: TAKE 1 TABLET DAILY     Cardiovascular:  Antilipid - Statins Failed - 03/18/2024 12:47 PM      Failed - Lipid Panel in normal range within the last 12 months    Cholesterol  Date Value Ref Range Status  12/21/2023 118 <200 mg/dL Final   LDL Cholesterol (Calc)  Date Value Ref Range Status  12/21/2023 60 mg/dL (calc) Final    Comment:    Reference range: <100 . Desirable range <100 mg/dL for primary prevention;   <70 mg/dL for patients with CHD or diabetic patients  with > or = 2 CHD risk factors. SABRA LDL-C is now calculated using the Martin-Hopkins  calculation, which is a validated novel method providing  better accuracy than the Friedewald equation in the  estimation of LDL-C.  Gladis APPLETHWAITE et al. SANDREA. 7986;689(80): 2061-2068  (http://education.[facility].com/faq/FAQ164)    HDL  Date Value Ref Range Status  12/21/2023 36 (L) > OR = 50 mg/dL Final   Triglycerides  Date Value Ref Range Status  12/21/2023 139 <150 mg/dL Final         Passed - Patient is not pregnant      Passed - Valid encounter within last 12 months    Recent Outpatient Visits           2 months ago Symptomatic anemia   [place] [facility], [place], NP   7 months ago Sepsis due to Escherichia coli with acute renal failure and septic shock, unspecified acute renal failure type [facility])   [place] [facility] [place], Angeline ORN, NP   9 months ago Toenail deformity   [place] [facility] [place], Angeline ORN, NP              Refused Prescriptions Disp Refills   amLODipine -olmesartan  (AZOR ) 10-40 MG tablet [Pharmacy Med Name: AMLODIPINE /OLMESARTAN  TABS 10/40MG ] 90 tablet 3    Sig: TAKE 1 TABLET DAILY     Cardiovascular: CCB + ARB Combos Failed - 03/18/2024 12:47 PM      Failed  - Cr in normal range and within 180 days    Creatinine  Date Value Ref Range Status  02/05/2024 1.01 (H) 0.44 - 1.00 mg/dL Final   Creat  Date Value Ref Range Status  12/21/2023 1.36 (H) 0.60 - 1.00 mg/dL Final         Failed - Na in normal range and within 180 days    Sodium  Date Value Ref Range Status  02/05/2024 134 (L) 135 - 145 mmol/L Final  10/08/2023 139 134 - 144 mmol/L Final         Failed - Last BP in normal range    BP Readings from Last 1 Encounters:  02/23/24 (!) 158/76         Passed - K in normal range and within 180 days    Potassium  Date Value Ref Range Status  02/05/2024 4.3 3.5 - 5.1 mmol/L Final         Passed - Patient is not pregnant      Passed - Valid encounter within last 6 months    Recent Outpatient Visits           2 months ago Symptomatic anemia   [place] [facility] [place], Angeline ORN, NP  7 months ago Sepsis due to Escherichia coli with acute renal failure and septic shock, unspecified acute renal failure type [facility])   [place] [facility] [place], Angeline ORN, NP   9 months ago Toenail deformity   [place] [facility] [place], Angeline ORN, [place]

## 2024-04-15 ENCOUNTER — Encounter: Payer: Self-pay | Admitting: Internal Medicine

## 2024-04-18 ENCOUNTER — Other Ambulatory Visit: Payer: Self-pay

## 2024-04-18 MED ORDER — ATORVASTATIN CALCIUM 20 MG PO TABS: 20.0000 mg | ORAL_TABLET | Freq: Every day | ORAL | 2 refills | Status: AC

## 2024-06-20 ENCOUNTER — Ambulatory Visit: Admitting: Internal Medicine

## 2024-06-27 ENCOUNTER — Inpatient Hospital Stay

## 2024-06-27 ENCOUNTER — Inpatient Hospital Stay: Admitting: Internal Medicine

## 2024-12-23 ENCOUNTER — Ambulatory Visit

## 2024-12-28 ENCOUNTER — Ambulatory Visit
